# Patient Record
Sex: Female | Born: 1970 | Race: White | Hispanic: No | Smoking: Never smoker
Health system: Southern US, Community
[De-identification: ages and names within clinical notes are randomized; demographics above are authoritative.]

## PROBLEM LIST (undated history)

## (undated) DIAGNOSIS — I10 Essential (primary) hypertension: Secondary | ICD-10-CM

## (undated) DIAGNOSIS — E079 Disorder of thyroid, unspecified: Secondary | ICD-10-CM

## (undated) DIAGNOSIS — F419 Anxiety disorder, unspecified: Secondary | ICD-10-CM

## (undated) DIAGNOSIS — F319 Bipolar disorder, unspecified: Secondary | ICD-10-CM

## (undated) DIAGNOSIS — E785 Hyperlipidemia, unspecified: Secondary | ICD-10-CM

## (undated) DIAGNOSIS — K219 Gastro-esophageal reflux disease without esophagitis: Secondary | ICD-10-CM

## (undated) DIAGNOSIS — Z9889 Other specified postprocedural states: Secondary | ICD-10-CM

## (undated) HISTORY — DX: Hyperlipidemia, unspecified: E78.5

## (undated) HISTORY — DX: Bipolar disorder, unspecified: F31.9

## (undated) HISTORY — DX: Disorder of thyroid, unspecified: E07.9

## (undated) HISTORY — DX: Other specified postprocedural states: Z98.890

## (undated) HISTORY — DX: Essential (primary) hypertension: I10

## (undated) HISTORY — DX: Anxiety disorder, unspecified: F41.9

## (undated) HISTORY — DX: Gastro-esophageal reflux disease without esophagitis: K21.9

---

## 2013-03-12 ENCOUNTER — Other Ambulatory Visit: Payer: Self-pay | Admitting: *Deleted

## 2013-03-12 ENCOUNTER — Other Ambulatory Visit: Payer: Self-pay | Admitting: Nurse Practitioner

## 2013-03-12 MED ORDER — LORATADINE 10 MG PO TABS: ORAL_TABLET | ORAL | Status: DC

## 2013-03-12 MED ORDER — LEVOTHYROXINE SODIUM 100 MCG PO TABS: 100.0000 ug | ORAL_TABLET | Freq: Every day | ORAL | Status: DC

## 2013-03-12 MED ORDER — CLONAZEPAM 0.5 MG PO TABS: ORAL_TABLET | ORAL | Status: DC

## 2013-03-12 MED ORDER — LAMOTRIGINE 100 MG PO TABS: 100.0000 mg | ORAL_TABLET | Freq: Every day | ORAL | Status: DC

## 2013-03-12 NOTE — Telephone Encounter (Signed)
call clonazepam rx in 0 refills

## 2013-03-12 NOTE — Telephone Encounter (Signed)
PLEASE PHONE IN RX FOR CLONAZEPAM. ROUTE TO NURSE PLEASE. ALSO PATIENT NEEDS REFILL ON LAMOTRIGINE. [facility] 100MG  AND CHART SAYS 25MG . I WILL SEND BACK FOR REVIEW.

## 2013-03-13 NOTE — Telephone Encounter (Signed)
CALLED INTO PHARMACY

## 2013-03-20 ENCOUNTER — Ambulatory Visit (INDEPENDENT_AMBULATORY_CARE_PROVIDER_SITE_OTHER): Payer: Medicaid Other | Admitting: Nurse Practitioner

## 2013-03-20 ENCOUNTER — Encounter: Payer: Self-pay | Admitting: Nurse Practitioner

## 2013-03-20 VITALS — BP 122/77 | HR 116 | Temp 99.1°F | Ht 61.0 in | Wt 228.0 lb

## 2013-03-20 DIAGNOSIS — F4001 Agoraphobia with panic disorder: Secondary | ICD-10-CM

## 2013-03-20 DIAGNOSIS — E785 Hyperlipidemia, unspecified: Secondary | ICD-10-CM

## 2013-03-20 DIAGNOSIS — G43909 Migraine, unspecified, not intractable, without status migrainosus: Secondary | ICD-10-CM

## 2013-03-20 DIAGNOSIS — Z309 Encounter for contraceptive management, unspecified: Secondary | ICD-10-CM

## 2013-03-20 DIAGNOSIS — E039 Hypothyroidism, unspecified: Secondary | ICD-10-CM

## 2013-03-20 DIAGNOSIS — R32 Unspecified urinary incontinence: Secondary | ICD-10-CM

## 2013-03-20 DIAGNOSIS — I10 Essential (primary) hypertension: Secondary | ICD-10-CM

## 2013-03-20 DIAGNOSIS — K219 Gastro-esophageal reflux disease without esophagitis: Secondary | ICD-10-CM

## 2013-03-20 DIAGNOSIS — B009 Herpesviral infection, unspecified: Secondary | ICD-10-CM

## 2013-03-20 DIAGNOSIS — F3162 Bipolar disorder, current episode mixed, moderate: Secondary | ICD-10-CM

## 2013-03-20 DIAGNOSIS — IMO0001 Reserved for inherently not codable concepts without codable children: Secondary | ICD-10-CM

## 2013-03-20 MED ORDER — DARIFENACIN HYDROBROMIDE ER 15 MG PO TB24: 15.0000 mg | ORAL_TABLET | Freq: Every day | ORAL | Status: DC

## 2013-03-20 MED ORDER — VALACYCLOVIR HCL 500 MG PO TABS: 500.0000 mg | ORAL_TABLET | Freq: Two times a day (BID) | ORAL | Status: DC

## 2013-03-20 MED ORDER — MEDROXYPROGESTERONE ACETATE 150 MG/ML IM SUSP
150.0000 mg | Freq: Once | INTRAMUSCULAR | Status: AC
  Administered 2013-03-20: 150 mg via INTRAMUSCULAR

## 2013-03-20 MED ORDER — RANITIDINE HCL 150 MG PO TABS: 150.0000 mg | ORAL_TABLET | Freq: Two times a day (BID) | ORAL | Status: DC

## 2013-03-20 NOTE — Patient Instructions (Signed)
.   Contraception - medroxyPROGESTERone (DEPO-PROVERA) injection 150 mg; Inject 1 mL (150 mg total) into the muscle once.  2. Hyperlipidemia with target LDL less than 100 Low fat diet and exercise - NMR Lipoprofile with Lipids  3. Migraines Avoid caffeine KeEP HEADACHE DIARY - AMB referral to headache clinic  4. Unspecified hypothyroidism   5. GERD (gastroesophageal reflux disease) Limit spicy and fatty foods - ranitidine (ZANTAC) 150 MG tablet; Take 1 tablet (150 mg total) by mouth 2 (two) times daily.  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  Dispense: 30 tablet; Refill: 5  7. Bipolar 1 disorder, mixed, moderate Stress management  8. Agoraphobia with panic attacks Continue Klonopin as needed   9. Urinary incontinence in female Try to fully empty bladder when voiding - darifenacin (ENABLEX) 15 MG 24 hr tablet; Take 1 tablet (15 mg total) by mouth daily.  Dispense: 30 tablet; Refill: 5  10. Essential hypertension, benign Limit Na+ in diet - COMPLETE METABOLIC PANEL WITH GFR  Mary-Margaret Daphine Deutscher, FNP

## 2013-03-20 NOTE — Progress Notes (Signed)
Subjective:    Patient ID: Dawn Craig, female    DOB: 02-20-1971, 42 y.o.   MRN: [id number]  Hypertension This is a chronic problem. The current episode started more than 1 year ago. The problem is unchanged. The problem is controlled. Pertinent negatives include no blurred vision, chest pain, headaches, neck pain, palpitations, peripheral edema or shortness of breath. Agents associated with hypertension include thyroid hormones. Risk factors for coronary artery disease include dyslipidemia, obesity and sedentary lifestyle. Past treatments include ACE inhibitors. The current treatment provides significant improvement.  Hyperlipidemia This is a chronic problem. The current episode started more than 1 month ago. The problem is controlled. Recent lipid tests were reviewed and are high. There are no known factors aggravating her hyperlipidemia. Pertinent negatives include no chest pain, focal weakness, leg pain, myalgias or shortness of breath. Current antihyperlipidemic treatment includes statins, diet change and exercise. There are no compliance problems.  Risk factors for coronary artery disease include hypertension and obesity.  Migraines Chronic problem. Says she has some type of headache everyday. Her last migraine was 2 days ago imitrex not helping anymore. Patient has never been to a headache clinic. Hypothyridism Chronic problem. On levothyroxin daily. Doing okay No c/o fatigue. Bipolar Chronic problem. Patient is on Lamictal. Takes Klonopin when she goes out because she has panic attacks when she is around a lot of people. Bladder leakage  Started back in November. Happens daily. Has to wear panty liners daily. Patient says she doesn't feel it sometimes. Has trouble completely emptying bladder. Does C/O urgency Fever blisters Occur weekly lasting 6 days. Can have more than one at the same time. GERD  Patient use to be on zantac but hasn't had Rx in awhile. Needs a refill. Review of  Systems  HENT: Negative for neck pain.   Eyes: Negative for blurred vision.  Respiratory: Negative for shortness of breath.   Cardiovascular: Negative for chest pain and palpitations.  Genitourinary: Positive for urgency. Negative for dysuria, hematuria, flank pain and pelvic pain.  Musculoskeletal: Negative for myalgias.  Neurological: Negative for focal weakness and headaches.       Objective:   Physical Exam  Constitutional: She is oriented to person, place, and time. She appears well-developed and well-nourished.  Cardiovascular: Normal rate, normal heart sounds and intact distal pulses.   Pulmonary/Chest: Effort normal and breath sounds normal.  Abdominal: Soft. Bowel sounds are normal. She exhibits no distension. There is no tenderness.  Neurological: She is alert and oriented to person, place, and time.  Skin: Skin is warm.  Psychiatric: She has a normal mood and affect. Her behavior is normal. Judgment and thought content normal.  BP 122/77  Pulse 116  Temp(Src) 99.1 F (37.3 C) (Oral)  Ht 5\' 1"  (1.549 m)  Wt 228 lb (103.42 kg)  BMI 43.1 kg/m2         Assessment & Plan:  1. Contraception - medroxyPROGESTERone (DEPO-PROVERA) injection 150 mg; Inject 1 mL (150 mg total) into the muscle once.  2. Hyperlipidemia with target LDL less than 100 Low fat diet and exercise - NMR Lipoprofile with Lipids  3. Migraines Avoid caffeine KeEP HEADACHE DIARY - AMB referral to headache clinic  4. Unspecified hypothyroidism   5. GERD (gastroesophageal reflux disease) Limit spicy and fatty foods - ranitidine (ZANTAC) 150 MG tablet; Take 1 tablet (150 mg total) by mouth 2 (two) times daily.  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  Dispense: 30 tablet; Refill: 5  7. Bipolar 1 disorder, mixed, moderate Stress  management  8. Agoraphobia with panic attacks Continue Klonopin as needed   9. Urinary incontinence in female Try to fully empty bladder when voiding Referral to urology if no improvement in 2 months  - darifenacin (ENABLEX) 15 MG 24 hr tablet; Take 1 tablet (15 mg total) by mouth daily.  Dispense: 30 tablet; Refill: 5  10. Essential hypertension, benign Limit Na+ in diet - COMPLETE METABOLIC PANEL WITH GFR  Mary-Margaret Daphine Deutscher, FNP

## 2013-03-21 ENCOUNTER — Telehealth: Payer: Self-pay | Admitting: Nurse Practitioner

## 2013-03-21 LAB — NMR LIPOPROFILE WITH LIPIDS
Cholesterol, Total: 103 mg/dL (ref <200)
LDL (calc): 54 mg/dL (ref <100)
LDL Particle Number: 774 nmol/L (ref <1000)
LP-IR Score: 50 — ABNORMAL HIGH (ref <=45)
Triglycerides: 104 mg/dL (ref <150)
VLDL Size: 43.5 nm (ref <=46.6)

## 2013-03-21 LAB — COMPLETE METABOLIC PANEL WITH GFR
AST: 18 U/L (ref 0–37)
Albumin: 4.5 g/dL (ref 3.5–5.2)
Alkaline Phosphatase: 61 U/L (ref 39–117)
BUN: 6 mg/dL (ref 6–23)
Creat: 0.68 mg/dL (ref 0.50–1.10)
Potassium: 3.7 mEq/L (ref 3.5–5.3)

## 2013-03-22 ENCOUNTER — Telehealth: Payer: Self-pay | Admitting: *Deleted

## 2013-03-22 NOTE — Telephone Encounter (Signed)
PT AWARE OF LABS 

## 2013-03-23 NOTE — Telephone Encounter (Signed)
Prior auth started by Lupita Leash L. ,tried to reach pt. No answer

## 2013-03-29 ENCOUNTER — Telehealth: Payer: Self-pay | Admitting: Nurse Practitioner

## 2013-03-29 MED ORDER — SOLIFENACIN SUCCINATE 10 MG PO TABS: 10.0000 mg | ORAL_TABLET | Freq: Every day | ORAL | Status: DC

## 2013-03-29 NOTE — Telephone Encounter (Signed)
RX changed to vesicare 10 mg Qd- RX sent to pharmacy

## 2013-03-29 NOTE — Telephone Encounter (Signed)
Enabalex denied, gave to MMM to change med. They will notify pt. Of change

## 2013-04-10 ENCOUNTER — Other Ambulatory Visit: Payer: Self-pay | Admitting: Nurse Practitioner

## 2013-04-10 NOTE — Telephone Encounter (Signed)
Pt left  Detailed message that new rx was sent in for vesicare

## 2013-05-11 ENCOUNTER — Other Ambulatory Visit: Payer: Self-pay | Admitting: Nurse Practitioner

## 2013-05-14 NOTE — Telephone Encounter (Signed)
Please call in clonazepam rx with 2 refills

## 2013-05-14 NOTE — Telephone Encounter (Signed)
Rx called in 

## 2013-05-14 NOTE — Telephone Encounter (Signed)
Last seen 03/20/13, last filled 03/12/13. If approved [facility]

## 2013-06-04 ENCOUNTER — Other Ambulatory Visit: Payer: Self-pay | Admitting: Nurse Practitioner

## 2013-06-07 ENCOUNTER — Other Ambulatory Visit: Payer: Self-pay | Admitting: Nurse Practitioner

## 2013-06-13 ENCOUNTER — Ambulatory Visit (INDEPENDENT_AMBULATORY_CARE_PROVIDER_SITE_OTHER): Payer: Medicaid Other | Admitting: Nurse Practitioner

## 2013-06-13 ENCOUNTER — Encounter: Payer: Self-pay | Admitting: Nurse Practitioner

## 2013-06-13 VITALS — BP 129/76 | HR 95 | Temp 98.5°F | Ht 61.0 in | Wt 208.0 lb

## 2013-06-13 DIAGNOSIS — K219 Gastro-esophageal reflux disease without esophagitis: Secondary | ICD-10-CM

## 2013-06-13 DIAGNOSIS — F3162 Bipolar disorder, current episode mixed, moderate: Secondary | ICD-10-CM

## 2013-06-13 DIAGNOSIS — G43909 Migraine, unspecified, not intractable, without status migrainosus: Secondary | ICD-10-CM

## 2013-06-13 DIAGNOSIS — R5383 Other fatigue: Secondary | ICD-10-CM

## 2013-06-13 DIAGNOSIS — I10 Essential (primary) hypertension: Secondary | ICD-10-CM

## 2013-06-13 DIAGNOSIS — E039 Hypothyroidism, unspecified: Secondary | ICD-10-CM

## 2013-06-13 DIAGNOSIS — Z309 Encounter for contraceptive management, unspecified: Secondary | ICD-10-CM

## 2013-06-13 DIAGNOSIS — R5381 Other malaise: Secondary | ICD-10-CM

## 2013-06-13 DIAGNOSIS — IMO0001 Reserved for inherently not codable concepts without codable children: Secondary | ICD-10-CM

## 2013-06-13 DIAGNOSIS — E785 Hyperlipidemia, unspecified: Secondary | ICD-10-CM

## 2013-06-13 DIAGNOSIS — E559 Vitamin D deficiency, unspecified: Secondary | ICD-10-CM

## 2013-06-13 DIAGNOSIS — R32 Unspecified urinary incontinence: Secondary | ICD-10-CM

## 2013-06-13 LAB — THYROID PANEL WITH TSH
Free Thyroxine Index: 4.7 — ABNORMAL HIGH (ref 1.0–3.9)
TSH: 0.206 u[IU]/mL — ABNORMAL LOW (ref 0.350–4.500)

## 2013-06-13 LAB — ANEMIA PANEL 7
ABS Retic: 57.6 10*3/uL (ref 19.0–186.0)
Folate: 14.2 ng/mL
Hemoglobin: 14.1 g/dL (ref 12.0–15.0)
MCHC: 34.8 g/dL (ref 30.0–36.0)
RBC.: 4.43 MIL/uL (ref 3.87–5.11)
RBC: 4.43 MIL/uL (ref 3.87–5.11)
Retic Ct Pct: 1.3 % (ref 0.4–2.3)
UIBC: 202 ug/dL (ref 125–400)
WBC: 6.2 10*3/uL (ref 4.0–10.5)

## 2013-06-13 LAB — COMPLETE METABOLIC PANEL WITH GFR
ALT: 13 U/L (ref 0–35)
AST: 16 U/L (ref 0–37)
Albumin: 4.4 g/dL (ref 3.5–5.2)
CO2: 25 mEq/L (ref 19–32)
Calcium: 9.6 mg/dL (ref 8.4–10.5)
Chloride: 104 mEq/L (ref 96–112)
GFR, Est African American: >89 mL/min
Potassium: 4 mEq/L (ref 3.5–5.3)

## 2013-06-13 MED ORDER — PRAVASTATIN SODIUM 40 MG PO TABS: 40.0000 mg | ORAL_TABLET | Freq: Every day | ORAL | Status: DC

## 2013-06-13 MED ORDER — MEDROXYPROGESTERONE ACETATE 150 MG/ML IM SUSP
150.0000 mg | Freq: Once | INTRAMUSCULAR | Status: AC
  Administered 2013-06-13: 150 mg via INTRAMUSCULAR

## 2013-06-13 MED ORDER — SOLIFENACIN SUCCINATE 10 MG PO TABS: 10.0000 mg | ORAL_TABLET | Freq: Every day | ORAL | Status: DC

## 2013-06-13 MED ORDER — LOSARTAN POTASSIUM-HCTZ 100-12.5 MG PO TABS: 1.0000 | ORAL_TABLET | Freq: Every day | ORAL | Status: DC

## 2013-06-13 MED ORDER — LAMOTRIGINE 100 MG PO TABS: 100.0000 mg | ORAL_TABLET | Freq: Every day | ORAL | Status: DC

## 2013-06-13 MED ORDER — RANITIDINE HCL 150 MG PO TABS: 150.0000 mg | ORAL_TABLET | Freq: Two times a day (BID) | ORAL | Status: DC

## 2013-06-13 MED ORDER — LEVOTHYROXINE SODIUM 100 MCG PO TABS: 100.0000 ug | ORAL_TABLET | Freq: Every day | ORAL | Status: DC

## 2013-06-13 MED ORDER — SUMATRIPTAN SUCCINATE 100 MG PO TABS: 100.0000 mg | ORAL_TABLET | ORAL | Status: DC | PRN

## 2013-06-13 NOTE — Progress Notes (Signed)
Subjective:    Patient ID: Dawn Craig, female    DOB: December 01, 1970, 42 y.o.   MRN: [id number]  Hypertension This is a chronic problem. The current episode started more than 1 year ago. The problem is unchanged. The problem is controlled. Pertinent negatives include no blurred vision, chest pain, headaches, neck pain, orthopnea, palpitations, peripheral edema, PND or shortness of breath. There are no associated agents to hypertension. Risk factors for coronary artery disease include dyslipidemia and obesity. Past treatments include angiotensin blockers and diuretics. The current treatment provides moderate improvement. There are no compliance problems.  Hypertensive end-organ damage includes a thyroid problem.  Hyperlipidemia This is a chronic problem. The current episode started more than 1 year ago. The problem is uncontrolled. Exacerbating diseases include obesity. She has no history of diabetes or hypothyroidism. There are no known factors aggravating her hyperlipidemia. Pertinent negatives include no chest pain, myalgias or shortness of breath. Current antihyperlipidemic treatment includes statins. The current treatment provides no improvement of lipids. There are no compliance problems.  Risk factors for coronary artery disease include stress.  Thyroid Problem Presents for follow-up (hypothyroidism) visit. Patient reports no constipation, depressed mood, diaphoresis, diarrhea, dry skin, heat intolerance, leg swelling, palpitations, visual change, weight gain or weight loss. The symptoms have been stable. Her past medical history is significant for hyperlipidemia. There is no history of diabetes.  GAD Klonopin working well no side effects bipolar Lamictal keeps her calm Migraines Imitrex helps and she takes 2-3 X per month Vitamin D deficiency Vitamin D OTC  * Light period for 1 month- On deposhot- Bleeding is only when she wipes- Has been on depo for about 8 years. * Rash in abdominal fold-  says she sweats a lot- uses antifungal cream which helps- but keeps coming back. * Bilateral ear pan - intermittent- has vertigo 1-2 X per week lasting about 30 minutes with some nauses * Fatigue-= she walks 5 miles daily but says that when she sits down she falls asleep- Hair loss she has noticed. Review of Systems  Constitutional: Negative for weight loss, weight gain and diaphoresis.  HENT: Negative for neck pain.   Eyes: Negative for blurred vision.  Respiratory: Negative for shortness of breath.   Cardiovascular: Negative for chest pain, palpitations, orthopnea and PND.  Gastrointestinal: Negative for diarrhea and constipation.  Endocrine: Negative for heat intolerance.  Musculoskeletal: Negative for myalgias.  Neurological: Negative for headaches.  All other systems reviewed and are negative.       Objective:   Physical Exam  Constitutional: She is oriented to person, place, and time. She appears well-developed and well-nourished.  HENT:  Nose: Nose normal.  Mouth/Throat: Oropharynx is clear and moist.  Eyes: EOM are normal.  Neck: Trachea normal, normal range of motion and full passive range of motion without pain. Neck supple. No JVD present. Carotid bruit is not present. No thyromegaly present.  Cardiovascular: Normal rate, regular rhythm, normal heart sounds and intact distal pulses.  Exam reveals no gallop and no friction rub.   No murmur heard. Pulmonary/Chest: Effort normal and breath sounds normal.  Abdominal: Soft. Bowel sounds are normal. She exhibits no distension and no mass. There is no tenderness.  Musculoskeletal: Normal range of motion.  Lymphadenopathy:    She has no cervical adenopathy.  Neurological: She is alert and oriented to person, place, and time. She has normal reflexes.  Skin: Skin is warm and dry.  Psychiatric: She has a normal mood and affect. Her behavior is normal. Judgment  and thought content normal.    BP 129/76  Pulse 95  Temp(Src) 98.5  F (36.9 C) (Oral)  Ht 5\' 1"  (1.549 m)  Wt 208 lb (94.348 kg)  BMI 39.32 kg/m2       Assessment & Plan:   1. Contraception   2. Hyperlipidemia with target LDL less than 100   3. Migraines   4. Unspecified hypothyroidism   5. GERD (gastroesophageal reflux disease)   6. Bipolar 1 disorder, mixed, moderate   7. Essential hypertension, benign   8. Urinary incontinence   9. Other malaise and fatigue   10. Unspecified vitamin D deficiency    Orders Placed This Encounter  Procedures  . COMPLETE METABOLIC PANEL WITH GFR  . NMR Lipoprofile with Lipids  . Thyroid Panel With TSH  . Anemia panel 7  . Vitamin D 25 hydroxy   Meds ordered this encounter  Medications  . medroxyPROGESTERone (DEPO-PROVERA) injection 150 mg    Sig:   . lamoTRIgine (LAMICTAL) 100 MG tablet    Sig: Take 1 tablet (100 mg total) by mouth daily.    Dispense:  30 tablet    Refill:  3    Order Specific Question:  Supervising Provider    Answer:  Ernestina Penna [[id number]]  . levothyroxine (SYNTHROID, LEVOTHROID) 100 MCG tablet    Sig: Take 1 tablet (100 mcg total) by mouth daily.    Dispense:  30 tablet    Refill:  5    Order Specific Question:  Supervising Provider    Answer:  Ernestina Penna [[id number]]  . losartan-hydrochlorothiazide (HYZAAR) 100-12.5 MG per tablet    Sig: Take 1 tablet by mouth daily.    Dispense:  30 tablet    Refill:  2    Order Specific Question:  Supervising Provider    Answer:  Ernestina Penna [[id number]]  . pravastatin (PRAVACHOL) 40 MG tablet    Sig: Take 1 tablet (40 mg total) by mouth daily.    Dispense:  30 tablet    Refill:  3    Order Specific Question:  Supervising Provider    Answer:  Ernestina Penna [[id number]]  . ranitidine (ZANTAC) 150 MG tablet    Sig: Take 1 tablet (150 mg total) by mouth 2 (two) times daily.    Dispense:  30 tablet    Refill:  5    Order Specific Question:  Supervising Provider    Answer:  Ernestina Penna [[id number]]  . solifenacin (VESICARE) 10 MG tablet     Sig: Take 1 tablet (10 mg total) by mouth daily.    Dispense:  30 tablet    Refill:  3    Order Specific Question:  Supervising Provider    Answer:  Ernestina Penna [[id number]]  . SUMAtriptan (IMITREX) 100 MG tablet    Sig: Take 1 tablet (100 mg total) by mouth every 2 (two) hours as needed for migraine.    Dispense:  10 tablet    Refill:  2    Order Specific Question:  Supervising Provider    Answer:  Deborra Medina   Continue all meds Labs pending Diet and exercise encouraged Force fluids prior to exercise Follow up in 3 months  Mary-Margaret Daphine Deutscher, FNP

## 2013-06-13 NOTE — Patient Instructions (Signed)
Vertigo Vertigo means you feel like you or your surroundings are moving when they are not. Vertigo can be dangerous if it occurs when you are at work, driving, or performing difficult activities.  CAUSES  Vertigo occurs when there is a conflict of signals sent to your brain from the visual and sensory systems in your body. There are many different causes of vertigo, including:  Infections, especially in the inner ear.  A bad reaction to a drug or misuse of alcohol and medicines.  Withdrawal from drugs or alcohol.  Rapidly changing positions, such as lying down or rolling over in bed.  A migraine headache.  Decreased blood flow to the brain.  Increased pressure in the brain from a head injury, infection, tumor, or bleeding. SYMPTOMS  You may feel as though the world is spinning around or you are falling to the ground. Because your balance is upset, vertigo can cause nausea and vomiting. You may have involuntary eye movements (nystagmus). DIAGNOSIS  Vertigo is usually diagnosed by physical exam. If the cause of your vertigo is unknown, your caregiver may perform imaging tests, such as an MRI scan (magnetic resonance imaging). TREATMENT  Most cases of vertigo resolve on their own, without treatment. Depending on the cause, your caregiver may prescribe certain medicines. If your vertigo is related to body position issues, your caregiver may recommend movements or procedures to correct the problem. In rare cases, if your vertigo is caused by certain inner ear problems, you may need surgery. HOME CARE INSTRUCTIONS   Follow your caregiver's instructions.  Avoid driving.  Avoid operating heavy machinery.  Avoid performing any tasks that would be dangerous to you or others during a vertigo episode.  Tell your caregiver if you notice that certain medicines seem to be causing your vertigo. Some of the medicines used to treat vertigo episodes can actually make them worse in some people. SEEK  IMMEDIATE MEDICAL CARE IF:   Your medicines do not relieve your vertigo or are making it worse.  You develop problems with talking, walking, weakness, or using your arms, hands, or legs.  You develop severe headaches.  Your nausea or vomiting continues or gets worse.  You develop visual changes.  A family member notices behavioral changes.  Your condition gets worse. MAKE SURE YOU:  Understand these instructions.  Will watch your condition.  Will get help right away if you are not doing well or get worse. Document Released: 08/25/2005 Document Revised: 02/07/2012 Document Reviewed: 06/03/2011 [employer] Patient Information 2014 [employer].  

## 2013-06-14 ENCOUNTER — Other Ambulatory Visit: Payer: Self-pay | Admitting: Nurse Practitioner

## 2013-06-14 LAB — NMR LIPOPROFILE WITH LIPIDS
Cholesterol, Total: 104 mg/dL (ref <200)
HDL Particle Number: 21.6 umol/L — ABNORMAL LOW (ref >=30.5)
LP-IR Score: 60 — ABNORMAL HIGH (ref <=45)
Large HDL-P: 2.2 umol/L — ABNORMAL LOW (ref >=4.8)
Large VLDL-P: 2.5 nmol/L (ref <=2.7)
Small LDL Particle Number: 658 nmol/L — ABNORMAL HIGH (ref <=527)

## 2013-06-14 MED ORDER — LEVOTHYROXINE SODIUM 88 MCG PO TABS: 88.0000 ug | ORAL_TABLET | Freq: Every day | ORAL | Status: DC

## 2013-08-22 ENCOUNTER — Other Ambulatory Visit: Payer: Self-pay | Admitting: Nurse Practitioner

## 2013-09-05 ENCOUNTER — Other Ambulatory Visit: Payer: Self-pay | Admitting: Nurse Practitioner

## 2013-09-13 ENCOUNTER — Ambulatory Visit (INDEPENDENT_AMBULATORY_CARE_PROVIDER_SITE_OTHER): Payer: Medicaid Other | Admitting: *Deleted

## 2013-09-13 DIAGNOSIS — Z3049 Encounter for surveillance of other contraceptives: Secondary | ICD-10-CM

## 2013-09-13 DIAGNOSIS — Z23 Encounter for immunization: Secondary | ICD-10-CM

## 2013-09-13 DIAGNOSIS — E039 Hypothyroidism, unspecified: Secondary | ICD-10-CM

## 2013-09-13 DIAGNOSIS — Z3009 Encounter for other general counseling and advice on contraception: Secondary | ICD-10-CM

## 2013-09-13 MED ORDER — MEDROXYPROGESTERONE ACETATE 150 MG/ML IM SUSP
150.0000 mg | Freq: Once | INTRAMUSCULAR | Status: AC
  Administered 2013-09-13: 150 mg via INTRAMUSCULAR

## 2013-09-14 LAB — THYROID PANEL WITH TSH
Free Thyroxine Index: 2.3 (ref 1.2–4.9)
TSH: 2.41 u[IU]/mL (ref 0.450–4.500)

## 2013-09-27 ENCOUNTER — Telehealth: Payer: Self-pay | Admitting: Nurse Practitioner

## 2013-09-27 ENCOUNTER — Ambulatory Visit (INDEPENDENT_AMBULATORY_CARE_PROVIDER_SITE_OTHER): Payer: Medicaid Other | Admitting: Nurse Practitioner

## 2013-09-27 ENCOUNTER — Encounter: Payer: Self-pay | Admitting: Nurse Practitioner

## 2013-09-27 VITALS — BP 134/85 | HR 88 | Temp 99.8°F | Ht 61.0 in | Wt 210.0 lb

## 2013-09-27 DIAGNOSIS — H109 Unspecified conjunctivitis: Secondary | ICD-10-CM

## 2013-09-27 DIAGNOSIS — R197 Diarrhea, unspecified: Secondary | ICD-10-CM

## 2013-09-27 MED ORDER — BETAMETHASONE DIPROPIONATE 0.05 % EX CREA: TOPICAL_CREAM | Freq: Two times a day (BID) | CUTANEOUS | Status: AC

## 2013-09-27 MED ORDER — TOBRAMYCIN-DEXAMETHASONE 0.3-0.1 % OP OINT: TOPICAL_OINTMENT | Freq: Three times a day (TID) | OPHTHALMIC | Status: DC

## 2013-09-27 NOTE — Progress Notes (Signed)
  Subjective:    Patient ID: Dawn Craig, female    DOB: [birth year], 41 y.o.   MRN: [id number]  Diarrhea  This is a new problem. The current episode started today. The problem occurs 5 to 10 times per day. The problem has been gradually worsening. The patient states that diarrhea awakens her from sleep. Associated symptoms include abdominal pain, bloating and a URI. Nothing aggravates the symptoms. She has tried nothing for the symptoms.  Conjunctivitis  The current episode started more than 2 weeks ago. The onset was gradual. The problem occurs continuously. The problem has been gradually worsening. The problem is mild. Associated symptoms include double vision, eye itching, abdominal pain, diarrhea, ear pain, URI, eye discharge, eye pain and eye redness. The eye pain is mild. The right eye is affected.The eye pain is associated with movement. The eyelid exhibits swelling and redness. There is nasal congestion. The ear pain is mild. There is pain in the left ear. There were no sick contacts.      Review of Systems  HENT: Positive for ear pain.   Eyes: Positive for double vision, pain, discharge, redness and itching.  Gastrointestinal: Positive for abdominal pain, diarrhea and bloating.  All other systems reviewed and are negative.       Objective:   Physical Exam  Vitals reviewed. Constitutional: She appears well-developed and well-nourished.  Eyes: Right eye exhibits discharge (erythemous and swelling).  Right eye has mild conjunctivitis, with eyelid swelling    Cardiovascular: Normal rate, regular rhythm, normal heart sounds and intact distal pulses.   Pulmonary/Chest: Effort normal and breath sounds normal.  Abdominal: Soft. Bowel sounds are normal. There is tenderness (mild).  Skin: Skin is warm and dry.  Psychiatric: She has a normal mood and affect. Her behavior is normal. Judgment and thought content normal.    BP 134/85  Pulse 88  Temp(Src) 99.8 F (37.7 C) (Oral)  Ht  5\' 1"  (1.549 m)  Wt 210 lb (95.255 kg)  BMI 39.7 kg/m2       Assessment & Plan:   1. Conjunctivitis of right eye   2. Diarrhea    Meds ordered this encounter  Medications  . tobramycin-dexamethasone (TOBRADEX) ophthalmic ointment    Sig: Place into the right eye 3 (three) times daily.    Dispense:  3.5 g    Refill:  0    Order Specific Question:  Supervising Provider    Answer:  Ernestina Penna [[id number]]  . betamethasone dipropionate (DIPROLENE) 0.05 % cream    Sig: Apply topically 2 (two) times daily.    Dispense:  30 g    Refill:  3    Order Specific Question:  Supervising Provider    Answer:  Ernestina Penna [[id number]]    Warm compression on eye Do not rub eye  Good hand hygiene Imodium OTC for diarrhea   Mary-Margaret Daphine Deutscher, FNP

## 2013-09-27 NOTE — Telephone Encounter (Signed)
Made appt w/ mmm 09/27/13.  rs

## 2013-09-27 NOTE — Patient Instructions (Signed)

## 2013-10-03 ENCOUNTER — Telehealth: Payer: Self-pay | Admitting: Nurse Practitioner

## 2013-10-04 ENCOUNTER — Ambulatory Visit (INDEPENDENT_AMBULATORY_CARE_PROVIDER_SITE_OTHER): Payer: Medicaid Other | Admitting: Nurse Practitioner

## 2013-10-04 ENCOUNTER — Encounter: Payer: Self-pay | Admitting: Nurse Practitioner

## 2013-10-04 VITALS — BP 148/98 | HR 86 | Temp 99.5°F | Ht 61.0 in | Wt 208.0 lb

## 2013-10-04 DIAGNOSIS — H00019 Hordeolum externum unspecified eye, unspecified eyelid: Secondary | ICD-10-CM

## 2013-10-04 DIAGNOSIS — H109 Unspecified conjunctivitis: Secondary | ICD-10-CM

## 2013-10-04 DIAGNOSIS — I10 Essential (primary) hypertension: Secondary | ICD-10-CM

## 2013-10-04 DIAGNOSIS — H00013 Hordeolum externum right eye, unspecified eyelid: Secondary | ICD-10-CM

## 2013-10-04 MED ORDER — ERYTHROMYCIN 5 MG/GM OP OINT: TOPICAL_OINTMENT | OPHTHALMIC | Status: DC

## 2013-10-04 NOTE — Telephone Encounter (Signed)
appt made for today to rck eye

## 2013-10-04 NOTE — Progress Notes (Signed)
  Subjective:    Patient ID: Dawn Craig, female    DOB: 12/28/1970, 42 y.o.   MRN: [id number]  HPI  Patient was seen last week c/o red and irritated eyes with discharge- was given tobradex drops- patient in today saying that right eye is no better- looked like it was getting better at first but now is charge is back again and it is a greenish color. Eye lashes matted together in AM.    Review of Systems  Constitutional: Negative.   HENT: Negative.   Eyes: Positive for pain, discharge and redness. Negative for photophobia, itching and visual disturbance.  Respiratory: Negative.   Cardiovascular: Negative.   Gastrointestinal: Negative.   Endocrine: Negative.   Genitourinary: Negative.   Musculoskeletal: Negative.   Neurological: Negative.   Hematological: Negative.   Psychiatric/Behavioral: Negative.        Objective:   Physical Exam  Constitutional: She appears well-developed and well-nourished.  HENT:  Erythematous small tender nodule upper lid border. Mild erythemtous conjunctivia- no drainage noted  Cardiovascular: Normal rate, regular rhythm, normal heart sounds and intact distal pulses.   Pulmonary/Chest: Effort normal and breath sounds normal.   BP 148/98  Pulse 86  Temp(Src) 99.5 F (37.5 C) (Oral)  Ht 5\' 1"  (1.549 m)  Wt 208 lb (94.348 kg)  BMI 39.32 kg/m2        Assessment & Plan:   1. Conjunctivitis   2. Stye, right   3. Essential hypertension, benign    Good hand washing Warm compresses Follow up prn  Mary-Margaret Daphine Deutscher, FNP

## 2013-10-04 NOTE — Patient Instructions (Signed)

## 2013-11-01 ENCOUNTER — Other Ambulatory Visit: Payer: Self-pay | Admitting: Nurse Practitioner

## 2013-11-28 ENCOUNTER — Other Ambulatory Visit: Payer: Self-pay | Admitting: Nurse Practitioner

## 2013-12-03 ENCOUNTER — Ambulatory Visit (INDEPENDENT_AMBULATORY_CARE_PROVIDER_SITE_OTHER): Payer: Medicaid Other | Admitting: *Deleted

## 2013-12-03 DIAGNOSIS — IMO0001 Reserved for inherently not codable concepts without codable children: Secondary | ICD-10-CM

## 2013-12-03 DIAGNOSIS — Z309 Encounter for contraceptive management, unspecified: Secondary | ICD-10-CM

## 2013-12-03 MED ORDER — MEDROXYPROGESTERONE ACETATE 150 MG/ML IM SUSP
150.0000 mg | Freq: Once | INTRAMUSCULAR | Status: AC
  Administered 2013-12-03: 150 mg via INTRAMUSCULAR

## 2013-12-03 NOTE — Patient Instructions (Signed)

## 2013-12-03 NOTE — Progress Notes (Signed)
Depo provera given and tolerated well. 

## 2013-12-14 ENCOUNTER — Ambulatory Visit (INDEPENDENT_AMBULATORY_CARE_PROVIDER_SITE_OTHER): Payer: Medicaid Other | Admitting: Nurse Practitioner

## 2013-12-14 ENCOUNTER — Other Ambulatory Visit: Payer: Self-pay | Admitting: Nurse Practitioner

## 2013-12-14 ENCOUNTER — Encounter: Payer: Self-pay | Admitting: Nurse Practitioner

## 2013-12-14 VITALS — BP 135/73 | HR 97 | Temp 98.0°F | Ht 61.0 in | Wt 218.0 lb

## 2013-12-14 DIAGNOSIS — E039 Hypothyroidism, unspecified: Secondary | ICD-10-CM

## 2013-12-14 DIAGNOSIS — F4001 Agoraphobia with panic disorder: Secondary | ICD-10-CM

## 2013-12-14 DIAGNOSIS — F3162 Bipolar disorder, current episode mixed, moderate: Secondary | ICD-10-CM

## 2013-12-14 DIAGNOSIS — E785 Hyperlipidemia, unspecified: Secondary | ICD-10-CM

## 2013-12-14 DIAGNOSIS — K219 Gastro-esophageal reflux disease without esophagitis: Secondary | ICD-10-CM

## 2013-12-14 DIAGNOSIS — J329 Chronic sinusitis, unspecified: Secondary | ICD-10-CM

## 2013-12-14 DIAGNOSIS — I10 Essential (primary) hypertension: Secondary | ICD-10-CM

## 2013-12-14 DIAGNOSIS — G43909 Migraine, unspecified, not intractable, without status migrainosus: Secondary | ICD-10-CM

## 2013-12-14 MED ORDER — AZITHROMYCIN 250 MG PO TABS: ORAL_TABLET | ORAL | Status: DC

## 2013-12-14 NOTE — Progress Notes (Signed)
Subjective:    Patient ID: Dawn Craig, female    DOB: 21-Jul-1971, 43 y.o.   MRN: [id number]  Hypertension This is a chronic problem. The current episode started more than 1 year ago. The problem is unchanged. The problem is controlled. Pertinent negatives include no blurred vision, chest pain, palpitations or peripheral edema. Agents associated with hypertension include thyroid hormones. Risk factors for coronary artery disease include dyslipidemia, obesity and sedentary lifestyle. Past treatments include ACE inhibitors. The current treatment provides significant improvement. Hypertensive end-organ damage includes a thyroid problem.  Hyperlipidemia This is a chronic problem. The current episode started more than 1 month ago. The problem is controlled. Recent lipid tests were reviewed and are high. There are no known factors aggravating her hyperlipidemia. Pertinent negatives include no chest pain, focal weakness, leg pain or myalgias. Current antihyperlipidemic treatment includes statins, diet change and exercise. There are no compliance problems.  Risk factors for coronary artery disease include hypertension and obesity.  Thyroid Problem Presents for follow-up (hypothyroidism) visit. Patient reports no constipation, depressed mood, diaphoresis, diarrhea, dry skin, heat intolerance, menstrual problem, palpitations, visual change, weight gain or weight loss. The symptoms have been stable. Her past medical history is significant for hyperlipidemia.  Migraines Chronic problem. Says she has some type of headache everyday. Her last migraine was 2 days ago imitrex not helping anymore. Patient has never been to a headache clinic. Bipolar Chronic problem. Patient is on Lamictal. Takes Klonopin when she goes out because she has panic attacks when she is around a lot of people. Bladder leakage  Started back in November. Happens daily. Has to wear panty liners daily. Patient says she doesn't feel it  sometimes. Has trouble completely emptying bladder. Does C/O urgency Fever blisters Occur weekly lasting 6 days. Can have more than one at the same time. GERD  Patient use to be on zantac but hasn't had Rx in awhile. Needs a refill. * c/o sinus pressure since October- no better- not really taking anything for it- denies fever and cough.- does have increasing headache- not really the same as her migraines. Review of Systems  Constitutional: Negative for weight loss, weight gain and diaphoresis.  Eyes: Negative for blurred vision.  Cardiovascular: Negative for chest pain and palpitations.  Gastrointestinal: Negative for diarrhea and constipation.  Endocrine: Negative for heat intolerance.  Genitourinary: Positive for urgency. Negative for dysuria, hematuria, flank pain, menstrual problem and pelvic pain.  Musculoskeletal: Negative for myalgias.  Neurological: Negative for focal weakness.       Objective:   Physical Exam  Constitutional: She is oriented to person, place, and time. She appears well-developed and well-nourished.  HENT:  Right Ear: Hearing, tympanic membrane, external ear and ear canal normal.  Left Ear: Hearing, tympanic membrane, external ear and ear canal normal.  Nose: Mucosal edema and rhinorrhea present. Right sinus exhibits maxillary sinus tenderness. Right sinus exhibits no frontal sinus tenderness. Left sinus exhibits maxillary sinus tenderness. Left sinus exhibits no frontal sinus tenderness.  Mouth/Throat: Uvula is midline, oropharynx is clear and moist and mucous membranes are normal.  Eyes: EOM are normal. Pupils are equal, round, and reactive to light.  Neck: Normal range of motion. Neck supple.  Cardiovascular: Normal rate, normal heart sounds and intact distal pulses.   Pulmonary/Chest: Effort normal and breath sounds normal.  Abdominal: Soft. Bowel sounds are normal. She exhibits no distension. There is no tenderness.  Lymphadenopathy:    She has no  cervical adenopathy.  Neurological: She is alert and oriented  to person, place, and time.  Skin: Skin is warm.  Psychiatric: She has a normal mood and affect. Her behavior is normal. Judgment and thought content normal.  BP 135/73  Pulse 97  Temp(Src) 98 F (36.7 C) (Oral)  Ht '5\' 1"'  (1.549 m)  Wt 218 lb (98.884 kg)  BMI 41.21 kg/m2         Assessment & Plan:   1. Unspecified hypothyroidism   2. Migraines   3. Hyperlipidemia LDL goal < 100   4. GERD (gastroesophageal reflux disease)   5. Essential hypertension, benign   6. Bipolar 1 disorder, mixed, moderate   7. Agoraphobia with panic attacks   8. Sinusitis, chronic    Orders Placed This Encounter  Procedures  . CMP14+EGFR  . NMR, lipoprofile  . Thyroid Panel With TSH    Meds ordered this encounter  Medications  . azithromycin (ZITHROMAX Z-PAK) 250 MG tablet    Sig: As directed    Dispense:  6 each    Refill:  0    Order Specific Question:  Supervising Provider    Answer:  [facility] [place] maintenance reviewed Diet and exercise encouraged Continue all meds 1. Take meds as prescribed 2. Use a cool mist humidifier especially during the winter months and when heat has been humid. 3. Use saline nose sprays frequently 4. Saline irrigations of the nose can be very helpful if done frequently.  * 4X daily for 1 week*  * Use of a nettie pot can be helpful with this. Follow directions with this* 5. Drink plenty of fluids 6. Keep thermostat turn down low 7.For any cough or congestion  Use plain Mucinex- regular strength or max strength is fine   * Children- consult with Pharmacist for dosing 8. For fever or aces or pains- take tylenol or ibuprofen appropriate for age and weight.  * for fevers greater than 101 orally you may alternate ibuprofen and tylenol every  3 hours.    Follow up  In 3 months   [place], FNP

## 2013-12-14 NOTE — Patient Instructions (Signed)

## 2013-12-15 LAB — CMP14+EGFR
ALT: 17 IU/L (ref 0–32)
AST: 17 IU/L (ref 0–40)
Albumin/Globulin Ratio: 2.1 (ref 1.1–2.5)
Albumin: 4.6 g/dL (ref 3.5–5.5)
Alkaline Phosphatase: 59 IU/L (ref 39–117)
BUN/Creatinine Ratio: 17 (ref 9–23)
BUN: 12 mg/dL (ref 6–24)
CALCIUM: 9.5 mg/dL (ref 8.7–10.2)
CO2: 22 mmol/L (ref 18–29)
Chloride: 101 mmol/L (ref 97–108)
Creatinine, Ser: 0.71 mg/dL (ref 0.57–1.00)
GFR calc Af Amer: 121 mL/min/{1.73_m2} (ref >59)
GFR calc non Af Amer: 105 mL/min/{1.73_m2} (ref >59)
Globulin, Total: 2.2 g/dL (ref 1.5–4.5)
Glucose: 104 mg/dL — ABNORMAL HIGH (ref 65–99)
POTASSIUM: 4.3 mmol/L (ref 3.5–5.2)
SODIUM: 140 mmol/L (ref 134–144)
Total Bilirubin: 0.9 mg/dL (ref 0.0–1.2)
Total Protein: 6.8 g/dL (ref 6.0–8.5)

## 2013-12-15 LAB — THYROID PANEL WITH TSH
Free Thyroxine Index: 2.9 (ref 1.2–4.9)
T3 UPTAKE RATIO: 30 % (ref 24–39)
T4, Total: 9.8 ug/dL (ref 4.5–12.0)
TSH: 2.3 u[IU]/mL (ref 0.450–4.500)

## 2013-12-15 LAB — NMR, LIPOPROFILE

## 2013-12-17 ENCOUNTER — Telehealth: Payer: Self-pay | Admitting: Family Medicine

## 2013-12-17 NOTE — Telephone Encounter (Signed)
Message copied by Azalee CourseFULP, ASHLEY on Mon Dec 17, 2013 11:16 AM ------      Message from: Bennie PieriniMARTIN, MARY-MARGARET      Created: Mon Dec 17, 2013  7:14 AM       All labs look good- recheck in 3 months- continue current meds ------

## 2014-01-03 ENCOUNTER — Other Ambulatory Visit: Payer: Self-pay | Admitting: Nurse Practitioner

## 2014-01-24 LAB — NMR, LIPOPROFILE
Cholesterol: 135 mg/dL (ref <200)
HDL CHOLESTEROL BY NMR: 42 mg/dL (ref >=40)
HDL Particle Number: 29.7 umol/L — ABNORMAL LOW (ref >=30.5)
LDL Particle Number: 1220 nmol/L — ABNORMAL HIGH (ref <1000)
LDL Size: 20.6 nm (ref >20.5)
LDLC SERPL CALC-MCNC: 79 mg/dL (ref <100)
LP-IR Score: 55 — ABNORMAL HIGH (ref <=45)
Small LDL Particle Number: 634 nmol/L — ABNORMAL HIGH (ref <=527)
Triglycerides by NMR: 72 mg/dL (ref <150)

## 2014-02-15 ENCOUNTER — Telehealth: Payer: Self-pay | Admitting: *Deleted

## 2014-02-15 NOTE — Telephone Encounter (Signed)
Not on med list- when was last pap

## 2014-02-15 NOTE — Telephone Encounter (Signed)
Pharmacy requesting refill on depo shot but do not see on current med list. Please advise

## 2014-02-18 ENCOUNTER — Ambulatory Visit: Payer: Medicaid Other

## 2014-02-18 ENCOUNTER — Other Ambulatory Visit: Payer: Self-pay | Admitting: Nurse Practitioner

## 2014-02-18 ENCOUNTER — Telehealth: Payer: Self-pay | Admitting: Nurse Practitioner

## 2014-02-18 MED ORDER — MEDROXYPROGESTERONE ACETATE 150 MG/ML IM SUSP: 150.0000 mg | Freq: Once | INTRAMUSCULAR | Status: DC

## 2014-02-18 NOTE — Telephone Encounter (Signed)
Her last pap was 2013 and she gets the injections here.

## 2014-02-18 NOTE — Telephone Encounter (Signed)
Not on her med list- who Rx and when was last shot?

## 2014-02-18 NOTE — Telephone Encounter (Signed)
Needs pap- when was last shot?

## 2014-02-18 NOTE — Telephone Encounter (Signed)
Last shot 12/03/13

## 2014-02-21 ENCOUNTER — Ambulatory Visit (INDEPENDENT_AMBULATORY_CARE_PROVIDER_SITE_OTHER): Payer: Medicaid Other | Admitting: *Deleted

## 2014-02-21 DIAGNOSIS — Z3009 Encounter for other general counseling and advice on contraception: Secondary | ICD-10-CM

## 2014-02-21 MED ORDER — MEDROXYPROGESTERONE ACETATE 150 MG/ML IM SUSP
150.0000 mg | Freq: Once | INTRAMUSCULAR | Status: AC
  Administered 2014-02-21: 150 mg via INTRAMUSCULAR

## 2014-03-06 ENCOUNTER — Other Ambulatory Visit: Payer: Self-pay | Admitting: *Deleted

## 2014-03-06 MED ORDER — FLUTICASONE PROPIONATE 50 MCG/ACT NA SUSP: NASAL | Status: DC

## 2014-03-15 ENCOUNTER — Encounter: Payer: Self-pay | Admitting: Nurse Practitioner

## 2014-03-15 ENCOUNTER — Ambulatory Visit (INDEPENDENT_AMBULATORY_CARE_PROVIDER_SITE_OTHER): Payer: Medicaid Other | Admitting: Nurse Practitioner

## 2014-03-15 VITALS — BP 122/79 | HR 101 | Temp 99.1°F | Ht 61.0 in | Wt 238.6 lb

## 2014-03-15 DIAGNOSIS — G43909 Migraine, unspecified, not intractable, without status migrainosus: Secondary | ICD-10-CM

## 2014-03-15 DIAGNOSIS — Z713 Dietary counseling and surveillance: Secondary | ICD-10-CM

## 2014-03-15 DIAGNOSIS — I10 Essential (primary) hypertension: Secondary | ICD-10-CM

## 2014-03-15 DIAGNOSIS — K219 Gastro-esophageal reflux disease without esophagitis: Secondary | ICD-10-CM

## 2014-03-15 DIAGNOSIS — F3162 Bipolar disorder, current episode mixed, moderate: Secondary | ICD-10-CM

## 2014-03-15 DIAGNOSIS — E039 Hypothyroidism, unspecified: Secondary | ICD-10-CM

## 2014-03-15 DIAGNOSIS — R32 Unspecified urinary incontinence: Secondary | ICD-10-CM

## 2014-03-15 DIAGNOSIS — F4001 Agoraphobia with panic disorder: Secondary | ICD-10-CM

## 2014-03-15 DIAGNOSIS — E785 Hyperlipidemia, unspecified: Secondary | ICD-10-CM

## 2014-03-15 NOTE — Patient Instructions (Signed)

## 2014-03-15 NOTE — Progress Notes (Signed)
Subjective:    Patient ID: Dawn Craig, female    DOB: [birth year], 43 y.o.   MRN: [id number]  Patient here today for follow up of chronic medical problems.  Hypertension This is a chronic problem. The current episode started more than 1 year ago. The problem is unchanged. The problem is controlled. Pertinent negatives include no blurred vision, chest pain, palpitations or peripheral edema. Agents associated with hypertension include thyroid hormones. Risk factors for coronary artery disease include dyslipidemia, obesity and sedentary lifestyle. Past treatments include ACE inhibitors. The current treatment provides significant improvement. Hypertensive end-organ damage includes a thyroid problem.  Hyperlipidemia This is a chronic problem. The current episode started more than 1 month ago. The problem is controlled. Recent lipid tests were reviewed and are high. There are no known factors aggravating her hyperlipidemia. Pertinent negatives include no chest pain, focal weakness, leg pain or myalgias. Current antihyperlipidemic treatment includes statins, diet change and exercise. There are no compliance problems.  Risk factors for coronary artery disease include hypertension and obesity.  Thyroid Problem Presents for follow-up (hypothyroidism) visit. Patient reports no constipation, depressed mood, diaphoresis, diarrhea, dry skin, heat intolerance, menstrual problem, palpitations, visual change, weight gain or weight loss. The symptoms have been stable. Her past medical history is significant for hyperlipidemia.  Migraines Chronic problem. Says she has some type of headache everyday. Her last migraine was 2 days ago imitrex not helping anymore. Patient has never been to a headache clinic. Bipolar Chronic problem. Patient is on Lamictal. Takes Klonopin when she goes out because she has panic attacks when she is around a lot of people. Bladder leakage  Started back in November. Happens daily. Has to  wear panty liners daily. Patient says she doesn't feel it sometimes. Has trouble completely emptying bladder. Does C/O urgency Fever blisters Occur weekly lasting 6 days. Can have more than one at the same time. GERD  Patient use to be on zantac but hasn't had Rx in awhile. Needs a refill.  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  Review of Systems  Constitutional: Negative for weight loss, weight gain and diaphoresis.  Eyes: Negative for blurred vision.  Cardiovascular: Negative for chest pain and palpitations.  Gastrointestinal: Negative for diarrhea and constipation.  Endocrine: Negative for heat intolerance.  Genitourinary: Positive for urgency. Negative for dysuria, hematuria, flank pain, menstrual problem and pelvic pain.  Musculoskeletal: Negative for myalgias.  Neurological: Negative for focal weakness.       Objective:   Physical Exam  Constitutional: She is oriented to person, place, and time. She appears well-developed and well-nourished.  HENT:  Right Ear: Hearing, tympanic membrane, external ear and ear canal normal.  Left Ear: Hearing, tympanic membrane, external ear and ear canal normal.  Nose: Mucosal edema and rhinorrhea present. Right sinus exhibits maxillary sinus tenderness. Right sinus exhibits no frontal sinus tenderness. Left sinus exhibits maxillary sinus tenderness. Left sinus exhibits no frontal sinus tenderness.  Mouth/Throat: Uvula is midline, oropharynx is clear and moist and mucous membranes are normal.  Eyes: EOM are normal. Pupils are equal, round, and reactive to light.  Neck: Normal range of motion. Neck supple.  Cardiovascular: Normal rate, normal heart sounds and intact distal pulses.   Pulmonary/Chest: Effort normal and breath sounds normal.  Abdominal: Soft. Bowel sounds are normal. She exhibits no distension. There is no tenderness.  Lymphadenopathy:    She  has  no cervical adenopathy.  Neurological: She is alert and oriented to person, place, and time.  Skin: Skin is warm.  Psychiatric: She has a normal mood and affect. Her behavior is normal. Judgment and thought content normal.  BP 122/79  Pulse 101  Temp(Src) 99.1 F (37.3 C) (Oral)  Ht '5\' 1"'  (1.549 m)  Wt 238 lb 9.6 oz (108.228 kg)  BMI 45.11 kg/m2         Assessment & Plan:   1. Urinary incontinence   2. Migraines   3. Hypothyroidism   4. Hyperlipidemia LDL goal < 100   5. GERD (gastroesophageal reflux disease)   6. Essential hypertension, benign   7. Bipolar 1 disorder, mixed, moderate   8. Agoraphobia with panic attacks   9. Severe obesity (BMI >= 40)   10. Weight loss counseling, encounter for    Orders Placed This Encounter  Procedures  . CMP14+EGFR  . NMR, lipoprofile   Patient to schedule mammogram Labs pending Health maintenance reviewed Diet and exercise encouraged Continue all meds Follow up  In 3 months   [place], FNP

## 2014-03-21 ENCOUNTER — Other Ambulatory Visit: Payer: Medicaid Other

## 2014-03-22 LAB — NMR, LIPOPROFILE
CHOLESTEROL: 105 mg/dL (ref <200)
HDL Cholesterol by NMR: 32 mg/dL — ABNORMAL LOW (ref >=40)
HDL Particle Number: 25.5 umol/L — ABNORMAL LOW (ref >=30.5)
LDL Particle Number: 668 nmol/L (ref <1000)
LDL SIZE: 20.6 nm (ref >20.5)
LDLC SERPL CALC-MCNC: 58 mg/dL (ref <100)
LP-IR Score: 54 — ABNORMAL HIGH (ref <=45)
Small LDL Particle Number: 287 nmol/L (ref <=527)
Triglycerides by NMR: 74 mg/dL (ref <150)

## 2014-03-22 LAB — CMP14+EGFR
ALBUMIN: 4.5 g/dL (ref 3.5–5.5)
ALT: 9 IU/L (ref 0–32)
AST: 16 IU/L (ref 0–40)
Albumin/Globulin Ratio: 2.1 (ref 1.1–2.5)
Alkaline Phosphatase: 60 IU/L (ref 39–117)
BILIRUBIN TOTAL: 1 mg/dL (ref 0.0–1.2)
BUN/Creatinine Ratio: 8 — ABNORMAL LOW (ref 9–23)
BUN: 7 mg/dL (ref 6–24)
CO2: 25 mmol/L (ref 18–29)
CREATININE: 0.84 mg/dL (ref 0.57–1.00)
Calcium: 9.4 mg/dL (ref 8.7–10.2)
Chloride: 100 mmol/L (ref 97–108)
GFR, EST AFRICAN AMERICAN: 99 mL/min/{1.73_m2} (ref >59)
GFR, EST NON AFRICAN AMERICAN: 86 mL/min/{1.73_m2} (ref >59)
GLOBULIN, TOTAL: 2.1 g/dL (ref 1.5–4.5)
GLUCOSE: 90 mg/dL (ref 65–99)
Potassium: 3.7 mmol/L (ref 3.5–5.2)
Sodium: 139 mmol/L (ref 134–144)
Total Protein: 6.6 g/dL (ref 6.0–8.5)

## 2014-04-05 ENCOUNTER — Other Ambulatory Visit: Payer: Self-pay

## 2014-04-05 MED ORDER — LAMOTRIGINE 100 MG PO TABS: 100.0000 mg | ORAL_TABLET | Freq: Every day | ORAL | Status: DC

## 2014-04-05 MED ORDER — LORATADINE 10 MG PO TABS: 10.0000 mg | ORAL_TABLET | Freq: Every day | ORAL | Status: DC

## 2014-04-05 MED ORDER — LOSARTAN POTASSIUM-HCTZ 100-12.5 MG PO TABS: 1.0000 | ORAL_TABLET | Freq: Every day | ORAL | Status: DC

## 2014-04-05 MED ORDER — SOLIFENACIN SUCCINATE 10 MG PO TABS: 10.0000 mg | ORAL_TABLET | Freq: Every day | ORAL | Status: DC

## 2014-04-05 NOTE — Telephone Encounter (Signed)
Last seen 03/15/14  MMM 

## 2014-05-09 ENCOUNTER — Ambulatory Visit: Payer: Medicaid Other

## 2014-05-09 ENCOUNTER — Other Ambulatory Visit: Payer: Self-pay | Admitting: Nurse Practitioner

## 2014-05-10 NOTE — Telephone Encounter (Signed)
Patient last seen in office on 03-15-14. Please advise

## 2014-05-12 ENCOUNTER — Other Ambulatory Visit: Payer: Self-pay | Admitting: Nurse Practitioner

## 2014-05-14 ENCOUNTER — Ambulatory Visit (INDEPENDENT_AMBULATORY_CARE_PROVIDER_SITE_OTHER): Payer: Medicaid Other | Admitting: *Deleted

## 2014-05-14 DIAGNOSIS — IMO0001 Reserved for inherently not codable concepts without codable children: Secondary | ICD-10-CM

## 2014-05-14 DIAGNOSIS — Z309 Encounter for contraceptive management, unspecified: Secondary | ICD-10-CM

## 2014-05-14 MED ORDER — MEDROXYPROGESTERONE ACETATE 150 MG/ML IM SUSP
150.0000 mg | INTRAMUSCULAR | Status: DC
  Administered 2014-05-14 – 2014-08-08 (×2): 150 mg via INTRAMUSCULAR

## 2014-05-14 NOTE — Progress Notes (Signed)
Patient ID: Dawn Craig, female   DOB: 02-Aug-1971, 43 y.o.   MRN: [id number] PT TOLERATED INJ WELL

## 2014-06-19 ENCOUNTER — Ambulatory Visit (INDEPENDENT_AMBULATORY_CARE_PROVIDER_SITE_OTHER): Payer: Medicaid Other | Admitting: Nurse Practitioner

## 2014-06-19 ENCOUNTER — Encounter: Payer: Self-pay | Admitting: Nurse Practitioner

## 2014-06-19 DIAGNOSIS — K219 Gastro-esophageal reflux disease without esophagitis: Secondary | ICD-10-CM

## 2014-06-19 DIAGNOSIS — R635 Abnormal weight gain: Secondary | ICD-10-CM

## 2014-06-19 DIAGNOSIS — L500 Allergic urticaria: Secondary | ICD-10-CM

## 2014-06-19 DIAGNOSIS — G43711 Chronic migraine without aura, intractable, with status migrainosus: Secondary | ICD-10-CM

## 2014-06-19 DIAGNOSIS — M533 Sacrococcygeal disorders, not elsewhere classified: Secondary | ICD-10-CM

## 2014-06-19 DIAGNOSIS — B009 Herpesviral infection, unspecified: Secondary | ICD-10-CM

## 2014-06-19 DIAGNOSIS — F3162 Bipolar disorder, current episode mixed, moderate: Secondary | ICD-10-CM

## 2014-06-19 DIAGNOSIS — R632 Polyphagia: Secondary | ICD-10-CM

## 2014-06-19 DIAGNOSIS — E785 Hyperlipidemia, unspecified: Secondary | ICD-10-CM

## 2014-06-19 DIAGNOSIS — I10 Essential (primary) hypertension: Secondary | ICD-10-CM

## 2014-06-19 DIAGNOSIS — E032 Hypothyroidism due to medicaments and other exogenous substances: Secondary | ICD-10-CM

## 2014-06-19 DIAGNOSIS — F4001 Agoraphobia with panic disorder: Secondary | ICD-10-CM

## 2014-06-19 DIAGNOSIS — F5081 Binge eating disorder: Secondary | ICD-10-CM

## 2014-06-19 MED ORDER — LISDEXAMFETAMINE DIMESYLATE 40 MG PO CAPS: 40.0000 mg | ORAL_CAPSULE | ORAL | Status: DC

## 2014-06-19 NOTE — Patient Instructions (Signed)
Binge Eating Disorder Binge eating is uncontrolled eating of large amounts of food. Binge eating occurs in a discrete period of time and may occur to the point of feeling physically uncomfortable. Binge eating that occurs regularly for 6 months or longer is considered binge eating disorder. People with binge eating disorder do not purge after binge eating.  RISK FACTORS More women than men have binge eating disorder. It usually starts during the teenage years or early 8520s. There is not one cause of binge eating disorder. A number of risk factors have been identified:  Family history of eating disorders. There may be a genetic component to eating disorders. Eating behaviors and learning how to cope with distress may also be learned.  Low self-esteem.  Overweight at a young age.  Frequent, unsuccessful dieting.  Depression or anxiety.  Difficulty coping with emotions or distress.  History of alcohol or drug abuse. SYMPTOMS Symptoms of binge eating disorder include:  Frequently eating large amounts of food very fast.  Frequently eating a lot more than necessary to feel full.  Feeling a loss of control when eating, such as not being able to stop eating.  Feeling bad about overeating. DIAGNOSIS A diagnosis of binge eating disorder is based on established criteria for the disorder. These criteria are listed as follows:  Binge eating 2 times per week or more for 6 months or longer.  Three or more of the following behaviors:  Eating very fast.  Eating until feeling uncomfortable.  Eating a lot of food when not hungry.  Eating alone to hide eating.  Feeling disgusted, depressed, or ashamed about eating.  Fear that binge eating cannot be stopped. TREATMENT Caregivers who usually treat people with binge eating disorders are mental health professionals, such as psychologists, psychiatrists, and clinical social workers. More than one type of treatment often is used. Treatments may  include:  Psychotherapy.  Cognitive behavioral therapy. This helps you recognize your thoughts, beliefs, and emotions that contribute to unhealthy eating habits. Then it helps you change that habit.  Interpersonal psychotherapy. This shows you how to have better relationships with others. You might learn new ways to communicate.  Dialectical behavioral therapy. This type of treatment helps you learn skills to regulate your emotions and tolerate distress without using binge eating.  Antidepressant medications. These medications affect the levels of certain chemicals in the brain. One type is called selective serotonin reuptake inhibitor. It often helps people with binge eating disorder.  Weight-loss programs. These can be important for binge eaters who weigh too much. Losing excess weight can help prevent other health problems, such as diabetes and heart disease. It can also help you feel better about yourself. SEEK IMMEDIATE MEDICAL CARE IF:   You have frequent nausea or you vomit often.  You have chest pain.  You have trouble breathing.  You think about harming yourself or someone else.  You start vomiting on purpose or using other behaviors (excessive exercise, laxatives) to compensate for binges. FOR MORE INFORMATION: National Eating Disorders Association: www.nationaleatingdisorders.org Document Released: 04/07/2011 Document Revised: 02/07/2012 Document Reviewed: 04/07/2011 [facility] Patient Information 2015 [place], [place]. This information is not intended to replace advice given to you by your health care provider. Make sure you discuss any questions you have with your health care provider.

## 2014-06-19 NOTE — Progress Notes (Signed)
Subjective:    Patient ID: Dawn Craig, female    DOB: 1971-02-01, 43 y.o.   MRN: [id number]  Patient here today for follow up of chronic medical problems. C/o pain in sacroliliac joints- ain gets severe at times- says tat it causes her to fall at times. SHe is breaking out in hives frequently and nit sure what it is coming from. She  Has gained weight and is binge eating- she will sometimes it until she throws up.  Hypertension This is a chronic problem. The current episode started more than 1 year ago. The problem is unchanged. The problem is controlled. Pertinent negatives include no blurred vision, chest pain, palpitations or peripheral edema. Agents associated with hypertension include thyroid hormones. Risk factors for coronary artery disease include dyslipidemia, obesity and sedentary lifestyle. Past treatments include ACE inhibitors. The current treatment provides significant improvement. Hypertensive end-organ damage includes a thyroid problem.  Hyperlipidemia This is a chronic problem. The current episode started more than 1 month ago. The problem is controlled. Recent lipid tests were reviewed and are high. There are no known factors aggravating her hyperlipidemia. Pertinent negatives include no chest pain, focal weakness, leg pain or myalgias. Current antihyperlipidemic treatment includes statins, diet change and exercise. There are no compliance problems.  Risk factors for coronary artery disease include hypertension and obesity.  Thyroid Problem Presents for follow-up (hypothyroidism) visit. Patient reports no constipation, depressed mood, diaphoresis, diarrhea, dry skin, heat intolerance, menstrual problem, palpitations, visual change, weight gain or weight loss. The symptoms have been stable. Her past medical history is significant for hyperlipidemia.  Migraines Chronic problem. Says she has some type of headache everyday. Her last migraine was 2 days ago imitrex not helping anymore.  Patient has never been to a headache clinic. Bipolar Chronic problem. Patient is on Lamictal. Takes Klonopin when she goes out because she has panic attacks when she is around a lot of people. Bladder leakage  Started back in November. Happens daily. Has to wear panty liners daily. Patient says she doesn't feel it sometimes. Has trouble completely emptying bladder. Does C/O urgency Fever blisters Occur weekly lasting 6 days. Can have more than one at the same time. GERD  Patient use to be on zantac but hasn't had Rx in awhile. Needs a refill.  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  Review of Systems  Constitutional: Negative for weight loss, weight gain and diaphoresis.  Eyes: Negative for blurred vision.  Cardiovascular: Negative for chest pain and palpitations.  Gastrointestinal: Negative for diarrhea and constipation.  Endocrine: Negative for heat intolerance.  Genitourinary: Positive for urgency. Negative for dysuria, hematuria, flank pain, menstrual problem and pelvic pain.  Musculoskeletal: Negative for myalgias.  Neurological: Negative for focal weakness.       Objective:   Physical Exam  Constitutional: She is oriented to person, place, and time. She appears well-developed and well-nourished.  HENT:  Right Ear: Hearing, tympanic membrane, external ear and ear canal normal.  Left Ear: Hearing, tympanic membrane, external ear and ear canal normal.  Nose: Mucosal edema and rhinorrhea present. Right sinus exhibits maxillary sinus tenderness. Right sinus exhibits no frontal sinus tenderness. Left sinus exhibits maxillary sinus tenderness. Left sinus exhibits no frontal sinus tenderness.  Mouth/Throat: Uvula is midline, oropharynx is clear and moist and mucous membranes are normal.  Eyes: EOM are normal. Pupils are equal, round, and reactive  to light.  Neck: Normal range of motion. Neck supple.   Cardiovascular: Normal rate, normal heart sounds and intact distal pulses.   Pulmonary/Chest: Effort normal and breath sounds normal.  Abdominal: Soft. Bowel sounds are normal. She exhibits no distension. There is no tenderness.  Lymphadenopathy:    She has no cervical adenopathy.  Neurological: She is alert and oriented to person, place, and time.  Skin: Skin is warm.  Psychiatric: She has a normal mood and affect. Her behavior is normal. Judgment and thought content normal.  BP 128/84  Pulse 106  Temp(Src) 98.1 F (36.7 C) (Oral)  Ht '5\' 1"'  (1.549 m)  Wt 245 lb (111.131 kg)  BMI 46.32 kg/m2  Binge eating questionaire-  1. Excessive over eating in the last 3 months- YES  2. Do you feel distressed about over eating- YES  3. How often do you feel you have no control over your eating? Always  4. How often do you continue eating even when you are not hungry? Always  5. How often are you embarrassed about how much you ate? Often  6. How often do you feel disgusted or guilty? Always  7.How often have you made yourself throw up after over eating? Never       Assessment & Plan:  1. Severe obesity (BMI >= 40) 2. 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 Hyperlipidemia with target LDL less than 100 - NMR, lipoprofile 4. Gastroesophageal reflux disease, esophagitis presence not specified Avoid over eating 5. Essential hypertension, benign Low Na+ diet - CMP14+EGFR 6. Bipolar 1 disorder, mixed, moderate Stress management 7. 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 Intractable chronic migraine without aura and with status migrainosus Avoid caffeine 10. Sacro ilial pain Patient wants to see chiropractor 11. Binge-eating disorder, severe Discussed in great length Patient encouraged to be conscous about what she is eating - lisdexamfetamine (VYVANSE) 40 MG capsule; Take 1 capsule (40 mg total) by  mouth every morning.  Dispense: 30 capsule; Refill: 0 - lisdexamfetamine (VYVANSE) 40 MG capsule; Take 1 capsule (40 mg total) by mouth every morning.  Dispense: 30 capsule; Refill: 0 12. Allergic urticaria Keep food diary so we can figure out what is causing reaction  Health maintenance reviewed Diet and exercise encouraged RTO in 2 months to see if vyvanse is helping Labs pending  Mary-Margaret Hassell Done, FNP

## 2014-06-20 LAB — CMP14+EGFR
ALBUMIN: 4.6 g/dL (ref 3.5–5.5)
ALK PHOS: 64 IU/L (ref 39–117)
ALT: 12 IU/L (ref 0–32)
AST: 14 IU/L (ref 0–40)
Albumin/Globulin Ratio: 2.1 (ref 1.1–2.5)
BUN / CREAT RATIO: 9 (ref 9–23)
BUN: 7 mg/dL (ref 6–24)
CO2: 23 mmol/L (ref 18–29)
Calcium: 9.1 mg/dL (ref 8.7–10.2)
Chloride: 101 mmol/L (ref 97–108)
Creatinine, Ser: 0.81 mg/dL (ref 0.57–1.00)
GFR calc Af Amer: 104 mL/min/{1.73_m2} (ref >59)
GFR calc non Af Amer: 90 mL/min/{1.73_m2} (ref >59)
Globulin, Total: 2.2 g/dL (ref 1.5–4.5)
Glucose: 109 mg/dL — ABNORMAL HIGH (ref 65–99)
POTASSIUM: 4.1 mmol/L (ref 3.5–5.2)
Sodium: 141 mmol/L (ref 134–144)
Total Bilirubin: 0.8 mg/dL (ref 0.0–1.2)
Total Protein: 6.8 g/dL (ref 6.0–8.5)

## 2014-06-20 LAB — NMR, LIPOPROFILE
CHOLESTEROL: 126 mg/dL (ref 100–199)
HDL Cholesterol by NMR: 41 mg/dL (ref >39)
HDL Particle Number: 30.5 umol/L (ref >=30.5)
LDL PARTICLE NUMBER: 937 nmol/L (ref <1000)
LDL SIZE: 20.5 nm (ref >20.5)
LDLC SERPL CALC-MCNC: 69 mg/dL (ref 0–99)
LP-IR SCORE: 66 — AB (ref <=45)
Small LDL Particle Number: 487 nmol/L (ref <=527)
Triglycerides by NMR: 81 mg/dL (ref 0–149)

## 2014-06-20 LAB — THYROID PANEL WITH TSH
FREE THYROXINE INDEX: 2.9 (ref 1.2–4.9)
T3 UPTAKE RATIO: 28 % (ref 24–39)
T4, Total: 10.3 ug/dL (ref 4.5–12.0)
TSH: 1.53 u[IU]/mL (ref 0.450–4.500)

## 2014-07-05 ENCOUNTER — Other Ambulatory Visit: Payer: Self-pay | Admitting: Nurse Practitioner

## 2014-07-08 ENCOUNTER — Other Ambulatory Visit: Payer: Self-pay | Admitting: Nurse Practitioner

## 2014-08-07 ENCOUNTER — Other Ambulatory Visit: Payer: Self-pay | Admitting: Nurse Practitioner

## 2014-08-07 ENCOUNTER — Telehealth: Payer: Self-pay | Admitting: Nurse Practitioner

## 2014-08-07 NOTE — Telephone Encounter (Signed)
Patient has appointment in 9/10 for depo

## 2014-08-08 ENCOUNTER — Ambulatory Visit (INDEPENDENT_AMBULATORY_CARE_PROVIDER_SITE_OTHER): Payer: Medicaid Other

## 2014-08-08 DIAGNOSIS — Z3042 Encounter for surveillance of injectable contraceptive: Secondary | ICD-10-CM

## 2014-08-08 DIAGNOSIS — Z3049 Encounter for surveillance of other contraceptives: Secondary | ICD-10-CM

## 2014-08-21 ENCOUNTER — Ambulatory Visit (INDEPENDENT_AMBULATORY_CARE_PROVIDER_SITE_OTHER): Payer: Medicaid Other | Admitting: Nurse Practitioner

## 2014-08-21 ENCOUNTER — Encounter: Payer: Self-pay | Admitting: Nurse Practitioner

## 2014-08-21 VITALS — BP 144/83 | HR 105 | Temp 99.6°F | Wt 241.8 lb

## 2014-08-21 DIAGNOSIS — G43711 Chronic migraine without aura, intractable, with status migrainosus: Secondary | ICD-10-CM

## 2014-08-21 DIAGNOSIS — E785 Hyperlipidemia, unspecified: Secondary | ICD-10-CM

## 2014-08-21 DIAGNOSIS — N3941 Urge incontinence: Secondary | ICD-10-CM

## 2014-08-21 DIAGNOSIS — F4001 Agoraphobia with panic disorder: Secondary | ICD-10-CM

## 2014-08-21 DIAGNOSIS — E032 Hypothyroidism due to medicaments and other exogenous substances: Secondary | ICD-10-CM

## 2014-08-21 DIAGNOSIS — F3162 Bipolar disorder, current episode mixed, moderate: Secondary | ICD-10-CM

## 2014-08-21 DIAGNOSIS — K219 Gastro-esophageal reflux disease without esophagitis: Secondary | ICD-10-CM

## 2014-08-21 DIAGNOSIS — F509 Eating disorder, unspecified: Secondary | ICD-10-CM

## 2014-08-21 DIAGNOSIS — J3089 Other allergic rhinitis: Secondary | ICD-10-CM

## 2014-08-21 DIAGNOSIS — F5081 Binge eating disorder: Secondary | ICD-10-CM | POA: Insufficient documentation

## 2014-08-21 DIAGNOSIS — B009 Herpesviral infection, unspecified: Secondary | ICD-10-CM

## 2014-08-21 DIAGNOSIS — J302 Other seasonal allergic rhinitis: Secondary | ICD-10-CM

## 2014-08-21 DIAGNOSIS — I10 Essential (primary) hypertension: Secondary | ICD-10-CM

## 2014-08-21 MED ORDER — LISDEXAMFETAMINE DIMESYLATE 50 MG PO CAPS: 50.0000 mg | ORAL_CAPSULE | Freq: Every day | ORAL | Status: DC

## 2014-08-21 MED ORDER — CLONAZEPAM 0.5 MG PO TABS: ORAL_TABLET | ORAL | Status: DC

## 2014-08-21 MED ORDER — CLONAZEPAM 0.5 MG PO TABS
ORAL_TABLET | ORAL | Status: DC
Start: 2014-08-21 — End: 2014-11-25

## 2014-08-21 MED ORDER — CETIRIZINE HCL 10 MG PO TABS: 10.0000 mg | ORAL_TABLET | Freq: Every day | ORAL | Status: DC

## 2014-08-21 MED ORDER — LISDEXAMFETAMINE DIMESYLATE 50 MG PO CAPS: 50.0000 mg | ORAL_CAPSULE | ORAL | Status: DC

## 2014-08-21 MED ORDER — MIRABEGRON ER 25 MG PO TB24: 25.0000 mg | ORAL_TABLET | Freq: Every day | ORAL | Status: DC

## 2014-08-21 NOTE — Progress Notes (Signed)
Subjective:    Patient ID: Dawn Craig, female    DOB: 11-Jun-1971, 43 y.o.   MRN: [id number]  Patient is here today for chronic visit follow up. No acute complaint today.   Hypertension This is a chronic problem. The current episode started more than 1 year ago. The problem is unchanged. The problem is controlled. Pertinent negatives include no blurred vision, chest pain, palpitations or peripheral edema. Agents associated with hypertension include thyroid hormones. Risk factors for coronary artery disease include dyslipidemia, obesity and sedentary lifestyle. Past treatments include ACE inhibitors. The current treatment provides significant improvement. Hypertensive end-organ damage includes a thyroid problem.  Hyperlipidemia This is a chronic problem. The current episode started more than 1 month ago. The problem is controlled. Recent lipid tests were reviewed and are high. There are no known factors aggravating her hyperlipidemia. Pertinent negatives include no chest pain, focal weakness, leg pain or myalgias. Current antihyperlipidemic treatment includes statins, diet change and exercise. There are no compliance problems.  Risk factors for coronary artery disease include hypertension and obesity.  Thyroid Problem Presents for follow-up (hypothyroidism) visit. Patient reports no constipation, depressed mood, diaphoresis, diarrhea, dry skin, heat intolerance, menstrual problem, palpitations, visual change, weight gain or weight loss. The symptoms have been stable. Her past medical history is significant for hyperlipidemia.  Migraines Chronic problem. Says she has some type of headache everyday. Her last migraine was 4  days ago imitrex not helping anymore. Patient has not been to a headache clinic. Bipolar Chronic problem. Patient is on Lamictal. Takes Klonopin when she goes out because she has panic attacks when she is around a lot of people. Bladder leakage  Started back in November. Happens  daily. Has to wear panty liners daily. Patient says she doesn't feel it sometimes. Has trouble completely emptying bladder. Does C/O urgency Fever blisters Occur weekly lasting 6 days. Can have more than one at the same time. GERD  Patient use to be on zantac PRN.   *Patient is seeing a chiropractor for her back and neck.    Review of Systems  Constitutional: Negative for weight loss, weight gain and diaphoresis.  Eyes: Negative for blurred vision.  Respiratory: Negative.   Cardiovascular: Negative for chest pain and palpitations.  Gastrointestinal: Negative for diarrhea and constipation.  Endocrine: Negative for heat intolerance.  Genitourinary: Positive for urgency. Negative for dysuria, hematuria, flank pain, menstrual problem and pelvic pain.  Musculoskeletal: Negative for myalgias.  Neurological: Negative for focal weakness.       Objective:   Physical Exam  Constitutional: She is oriented to person, place, and time. She appears well-developed and well-nourished.  HENT:  Right Ear: Hearing, tympanic membrane, external ear and ear canal normal.  Left Ear: Hearing, tympanic membrane, external ear and ear canal normal.  Nose: Right sinus exhibits no frontal sinus tenderness. Left sinus exhibits no frontal sinus tenderness.  Mouth/Throat: Uvula is midline, oropharynx is clear and moist and mucous membranes are normal.  Eyes: EOM are normal. Pupils are equal, round, and reactive to light.  Neck: Normal range of motion. Neck supple.  Cardiovascular: Normal rate, normal heart sounds and intact distal pulses.   Pulmonary/Chest: Effort normal and breath sounds normal.  Abdominal: Soft. Bowel sounds are normal. She exhibits no distension. There is no tenderness.  Lymphadenopathy:    She has no cervical adenopathy.  Neurological: She is alert and oriented to person, place, and time.  Skin: Skin is warm.  Psychiatric: She has a normal mood and affect. Her  behavior is normal. Judgment  and thought content normal.    BP 144/83  Pulse 105  Temp(Src) 99.6 F (37.6 C) (Oral)  Wt 241 lb 12.8 oz (109.68 kg)         Assessment & Plan:   1. 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 Hyperlipidemia with target LDL less than 100 Low fat diet - CMP14+EGFR - NMR, lipoprofile  4. HSV-1 (herpes simplex virus 1) infection  5. Gastroesophageal reflux disease, esophagitis presence not specified Do not eat 3 hours prior to bedtime Avoid spicy food  6. Essential hypertension, benign Low Na+ det  7. Bipolar 1 disorder, mixed, moderate Continue counseling - clonazePAM (KLONOPIN) 0.5 MG tablet; TAKE 1 TABLET TWICE A DAY  Dispense: 60 tablet; Refill: 2  8. Severe obesity (BMI >= 40) Discussed diet and exercise for person with BMI >25 Will recheck weight in 3-6 months  9. Urge incontinence of urine Changed from vesicare which was nt working to [employer] - mirabegron ER (MYRBETRIQ) 25 MG TB24 tablet; Take 1 tablet (25 mg total) by mouth daily.  Dispense: 30 tablet; Refill: 0  10. Other seasonal allergic rhinitis Changed from claritin to zyrtec - cetirizine (ZYRTEC) 10 MG tablet; Take 1 tablet (10 mg total) by mouth daily.  Dispense: 30 tablet; Refill: 11  11. Binge eating disorder Increased dose of vyvanse from 13m to 596m- lisdexamfetamine (VYVANSE) 50 MG capsule; Take 1 capsule (50 mg total) by mouth daily.  Dispense: 30 capsule; Refill: 0 - lisdexamfetamine (VYVANSE) 50 MG capsule; Take 1 capsule (50 mg total) by mouth every morning.  Dispense: 30 capsule; Refill: 0  12. Agoraphobia with panic attacks  Labs pending Health maintenance reviewed Diet and exercise encouraged Continue all meds Follow up  In 3 months   MaDentonFNP

## 2014-08-21 NOTE — Patient Instructions (Addendum)
Binge Eating Disorder Binge eating is uncontrolled eating of large amounts of food. Binge eating occurs in a discrete period of time and may occur to the point of feeling physically uncomfortable. Binge eating that occurs regularly for 6 months or longer is considered binge eating disorder. People with binge eating disorder do not purge after binge eating.  RISK FACTORS More women than men have binge eating disorder. It usually starts during the teenage years or early 87s. There is not one cause of binge eating disorder. A number of risk factors have been identified:  Family history of eating disorders. There may be a genetic component to eating disorders. Eating behaviors and learning how to cope with distress may also be learned.  Low self-esteem.  Overweight at a young age.  Frequent, unsuccessful dieting.  Depression or anxiety.  Difficulty coping with emotions or distress.  History of alcohol or drug abuse. SYMPTOMS Symptoms of binge eating disorder include:  Frequently eating large amounts of food very fast.  Frequently eating a lot more than necessary to feel full.  Feeling a loss of control when eating, such as not being able to stop eating.  Feeling bad about overeating. DIAGNOSIS A diagnosis of binge eating disorder is based on established criteria for the disorder. These criteria are listed as follows:  Binge eating 2 times per week or more for 6 months or longer.  Three or more of the following behaviors:  Eating very fast.  Eating until feeling uncomfortable.  Eating a lot of food when not hungry.  Eating alone to hide eating.  Feeling disgusted, depressed, or ashamed about eating.  Fear that binge eating cannot be stopped. TREATMENT Caregivers who usually treat people with binge eating disorders are mental health professionals, such as psychologists, psychiatrists, and clinical social workers. More than one type of treatment often is used. Treatments may  include:  Psychotherapy.  Cognitive behavioral therapy. This helps you recognize your thoughts, beliefs, and emotions that contribute to unhealthy eating habits. Then it helps you change that habit.  Interpersonal psychotherapy. This shows you how to have better relationships with others. You might learn new ways to communicate.  Dialectical behavioral therapy. This type of treatment helps you learn skills to regulate your emotions and tolerate distress without using binge eating.  Antidepressant medications. These medications affect the levels of certain chemicals in the brain. One type is called selective serotonin reuptake inhibitor. It often helps people with binge eating disorder.  Weight-loss programs. These can be important for binge eaters who weigh too much. Losing excess weight can help prevent other health problems, such as diabetes and heart disease. It can also help you feel better about yourself. SEEK IMMEDIATE MEDICAL CARE IF:   You have frequent nausea or you vomit often.  You have chest pain.  You have trouble breathing.  You think about harming yourself or someone else.  You start vomiting on purpose or using other behaviors (excessive exercise, laxatives) to compensate for binges. FOR MORE INFORMATION: National Eating Disorders Association: www.nationaleatingdisorders.org Document Released: 04/07/2011 Document Revised: 02/07/2012 Document Reviewed: 04/07/2011 [facility] Patient Information 2015 [place], [place]. This information is not intended to replace advice given to you by your health care provider. Make sure you discuss any questions you have with your health care provider.

## 2014-08-22 LAB — NMR, LIPOPROFILE
Cholesterol: 121 mg/dL (ref 100–199)
HDL CHOLESTEROL BY NMR: 39 mg/dL — AB (ref >39)
HDL Particle Number: 29 umol/L — ABNORMAL LOW (ref >=30.5)
LDL PARTICLE NUMBER: 731 nmol/L (ref <1000)
LDL Size: 20.5 nm (ref >20.5)
LDLC SERPL CALC-MCNC: 62 mg/dL (ref 0–99)
LP-IR Score: 65 — ABNORMAL HIGH (ref <=45)
Small LDL Particle Number: 321 nmol/L (ref <=527)
Triglycerides by NMR: 98 mg/dL (ref 0–149)

## 2014-08-22 LAB — CMP14+EGFR
ALT: 12 IU/L (ref 0–32)
AST: 16 IU/L (ref 0–40)
Albumin/Globulin Ratio: 2 (ref 1.1–2.5)
Albumin: 4.5 g/dL (ref 3.5–5.5)
Alkaline Phosphatase: 70 IU/L (ref 39–117)
BUN/Creatinine Ratio: 9 (ref 9–23)
BUN: 7 mg/dL (ref 6–24)
CALCIUM: 9.7 mg/dL (ref 8.7–10.2)
CO2: 24 mmol/L (ref 18–29)
Chloride: 100 mmol/L (ref 97–108)
Creatinine, Ser: 0.79 mg/dL (ref 0.57–1.00)
GFR calc Af Amer: 107 mL/min/{1.73_m2} (ref >59)
GFR calc non Af Amer: 93 mL/min/{1.73_m2} (ref >59)
GLUCOSE: 110 mg/dL — AB (ref 65–99)
Globulin, Total: 2.3 g/dL (ref 1.5–4.5)
POTASSIUM: 3.8 mmol/L (ref 3.5–5.2)
Sodium: 140 mmol/L (ref 134–144)
TOTAL PROTEIN: 6.8 g/dL (ref 6.0–8.5)
Total Bilirubin: 0.7 mg/dL (ref 0.0–1.2)

## 2014-08-27 ENCOUNTER — Other Ambulatory Visit: Payer: Self-pay | Admitting: Nurse Practitioner

## 2014-09-08 ENCOUNTER — Other Ambulatory Visit: Payer: Self-pay | Admitting: Family Medicine

## 2014-09-19 ENCOUNTER — Other Ambulatory Visit: Payer: Self-pay | Admitting: *Deleted

## 2014-09-19 ENCOUNTER — Other Ambulatory Visit: Payer: Self-pay | Admitting: Nurse Practitioner

## 2014-09-19 ENCOUNTER — Telehealth: Payer: Self-pay | Admitting: Family Medicine

## 2014-09-19 DIAGNOSIS — N3941 Urge incontinence: Secondary | ICD-10-CM

## 2014-09-19 MED ORDER — MIRABEGRON ER 25 MG PO TB24: 25.0000 mg | ORAL_TABLET | Freq: Every day | ORAL | Status: DC

## 2014-09-19 NOTE — Telephone Encounter (Signed)
Lm, patient aware sample at front.

## 2014-09-23 NOTE — Telephone Encounter (Signed)
Last seen 08/21/14 MMM 

## 2014-10-07 ENCOUNTER — Other Ambulatory Visit: Payer: Self-pay | Admitting: Nurse Practitioner

## 2014-10-08 NOTE — Telephone Encounter (Addendum)
Pt needs refill on Lamictal, she has appt scheduled with you on 12/28 15, last ordered 06/2014 #30 with 2 refills.

## 2014-10-18 ENCOUNTER — Telehealth: Payer: Self-pay | Admitting: Nurse Practitioner

## 2014-10-18 DIAGNOSIS — N3941 Urge incontinence: Secondary | ICD-10-CM

## 2014-10-18 MED ORDER — MIRABEGRON ER 25 MG PO TB24: 25.0000 mg | ORAL_TABLET | Freq: Every day | ORAL | Status: DC

## 2014-10-18 NOTE — Telephone Encounter (Signed)
What will insurance pay for? 

## 2014-10-18 NOTE — Telephone Encounter (Signed)
Patient aware samples up front to be picked up.  

## 2014-10-21 ENCOUNTER — Other Ambulatory Visit: Payer: Self-pay | Admitting: *Deleted

## 2014-10-21 ENCOUNTER — Other Ambulatory Visit: Payer: Self-pay | Admitting: Nurse Practitioner

## 2014-10-21 DIAGNOSIS — F5081 Binge eating disorder: Secondary | ICD-10-CM

## 2014-10-21 MED ORDER — LISDEXAMFETAMINE DIMESYLATE 50 MG PO CAPS: 50.0000 mg | ORAL_CAPSULE | Freq: Every day | ORAL | Status: DC

## 2014-10-21 NOTE — Telephone Encounter (Signed)
Last filled and seen 08/21/14

## 2014-10-21 NOTE — Telephone Encounter (Signed)
vyvanse rx ready for pick up  

## 2014-10-21 NOTE — Telephone Encounter (Signed)
Patient came by today for script.

## 2014-10-28 ENCOUNTER — Ambulatory Visit (INDEPENDENT_AMBULATORY_CARE_PROVIDER_SITE_OTHER): Payer: Medicaid Other | Admitting: *Deleted

## 2014-10-28 ENCOUNTER — Ambulatory Visit: Payer: Medicaid Other

## 2014-10-28 ENCOUNTER — Other Ambulatory Visit: Payer: Self-pay | Admitting: Nurse Practitioner

## 2014-10-28 DIAGNOSIS — Z3042 Encounter for surveillance of injectable contraceptive: Secondary | ICD-10-CM

## 2014-10-28 DIAGNOSIS — Z23 Encounter for immunization: Secondary | ICD-10-CM

## 2014-10-28 DIAGNOSIS — I10 Essential (primary) hypertension: Secondary | ICD-10-CM

## 2014-10-28 MED ORDER — LOSARTAN POTASSIUM 100 MG PO TABS: 100.0000 mg | ORAL_TABLET | Freq: Every day | ORAL | Status: DC

## 2014-10-28 MED ORDER — HYDROCHLOROTHIAZIDE 12.5 MG PO TABS
12.5000 mg | ORAL_TABLET | Freq: Every day | ORAL | Status: DC
Start: 2014-10-28 — End: 2014-11-25

## 2014-10-28 MED ORDER — MEDROXYPROGESTERONE ACETATE 150 MG/ML IM SUSP
150.0000 mg | Freq: Once | INTRAMUSCULAR | Status: AC
  Administered 2014-10-28: 150 mg via INTRAMUSCULAR

## 2014-11-03 ENCOUNTER — Other Ambulatory Visit: Payer: Self-pay | Admitting: Nurse Practitioner

## 2014-11-14 ENCOUNTER — Telehealth: Payer: Self-pay | Admitting: Nurse Practitioner

## 2014-11-14 NOTE — Telephone Encounter (Signed)
Aware,samples  at front.

## 2014-11-20 ENCOUNTER — Telehealth: Payer: Self-pay | Admitting: Nurse Practitioner

## 2014-11-20 DIAGNOSIS — F5081 Binge eating disorder: Secondary | ICD-10-CM

## 2014-11-21 MED ORDER — LISDEXAMFETAMINE DIMESYLATE 50 MG PO CAPS: 50.0000 mg | ORAL_CAPSULE | Freq: Every day | ORAL | Status: DC

## 2014-11-21 NOTE — Telephone Encounter (Signed)
vyvanse rx ready for pick up  

## 2014-11-25 ENCOUNTER — Ambulatory Visit (INDEPENDENT_AMBULATORY_CARE_PROVIDER_SITE_OTHER): Payer: Medicaid Other | Admitting: Nurse Practitioner

## 2014-11-25 ENCOUNTER — Encounter: Payer: Self-pay | Admitting: Nurse Practitioner

## 2014-11-25 ENCOUNTER — Encounter (INDEPENDENT_AMBULATORY_CARE_PROVIDER_SITE_OTHER): Payer: Self-pay

## 2014-11-25 VITALS — BP 166/78 | HR 134 | Temp 98.0°F | Ht 61.0 in | Wt 238.0 lb

## 2014-11-25 DIAGNOSIS — K219 Gastro-esophageal reflux disease without esophagitis: Secondary | ICD-10-CM

## 2014-11-25 DIAGNOSIS — E039 Hypothyroidism, unspecified: Secondary | ICD-10-CM

## 2014-11-25 DIAGNOSIS — F4001 Agoraphobia with panic disorder: Secondary | ICD-10-CM

## 2014-11-25 DIAGNOSIS — E785 Hyperlipidemia, unspecified: Secondary | ICD-10-CM

## 2014-11-25 DIAGNOSIS — F3162 Bipolar disorder, current episode mixed, moderate: Secondary | ICD-10-CM

## 2014-11-25 DIAGNOSIS — N3941 Urge incontinence: Secondary | ICD-10-CM

## 2014-11-25 DIAGNOSIS — I10 Essential (primary) hypertension: Secondary | ICD-10-CM

## 2014-11-25 DIAGNOSIS — G43711 Chronic migraine without aura, intractable, with status migrainosus: Secondary | ICD-10-CM

## 2014-11-25 MED ORDER — LOSARTAN POTASSIUM-HCTZ 100-25 MG PO TABS: 1.0000 | ORAL_TABLET | Freq: Every day | ORAL | Status: DC

## 2014-11-25 MED ORDER — LAMOTRIGINE 100 MG PO TABS: ORAL_TABLET | ORAL | Status: DC

## 2014-11-25 MED ORDER — RANITIDINE HCL 150 MG PO TABS: 150.0000 mg | ORAL_TABLET | Freq: Two times a day (BID) | ORAL | Status: DC

## 2014-11-25 MED ORDER — LEVOTHYROXINE SODIUM 88 MCG PO TABS: ORAL_TABLET | ORAL | Status: DC

## 2014-11-25 MED ORDER — CLONAZEPAM 0.5 MG PO TABS: ORAL_TABLET | ORAL | Status: DC

## 2014-11-25 MED ORDER — PRAVASTATIN SODIUM 40 MG PO TABS: ORAL_TABLET | ORAL | Status: DC

## 2014-11-25 MED ORDER — MIRABEGRON ER 25 MG PO TB24: 25.0000 mg | ORAL_TABLET | Freq: Every day | ORAL | Status: DC

## 2014-11-25 MED ORDER — MEDROXYPROGESTERONE ACETATE 150 MG/ML IM SUSP: INTRAMUSCULAR | Status: DC

## 2014-11-25 NOTE — Progress Notes (Signed)
Subjective:    Patient ID: Dawn Craig, female    DOB: [birth year], 43 y.o.   MRN: [id number]  Patient is here today for chronic visit follow up. No acute complaint today. Patient says that she has been having a lot of panic attacks with stress. Having frequent headaches. Stress is causing her to over eat.  Hypertension This is a chronic problem. The current episode started more than 1 year ago. The problem is unchanged. The problem is controlled. Pertinent negatives include no chest pain or palpitations. Risk factors for coronary artery disease include dyslipidemia, obesity and sedentary lifestyle. Past treatments include diuretics and angiotensin blockers. The current treatment provides mild improvement. Compliance problems include diet and exercise.  Hypertensive end-organ damage includes a thyroid problem.  Hyperlipidemia This is a chronic problem. The current episode started more than 1 year ago. The problem is uncontrolled. Recent lipid tests were reviewed and are variable. Pertinent negatives include no chest pain or myalgias. Current antihyperlipidemic treatment includes statins. Compliance problems include adherence to diet and adherence to exercise.  Risk factors for coronary artery disease include dyslipidemia, hypertension and obesity.  Thyroid Problem Presents for follow-up (hypothyroidism) visit. Patient reports no constipation, diaphoresis, diarrhea, heat intolerance, menstrual problem, palpitations or visual change. The symptoms have been stable. Her past medical history is significant for hyperlipidemia.  Migraines Chronic problem. Says she has some type of headache everyday. Her last migraine was 4  days ago imitrex not helping anymore. Patient has not been to a headache clinic. Bipolar Chronic problem. Patient is on Lamictal. Takes Klonopin when she goes out because she has panic attacks when she is around a lot of people. Bladder leakage  Started back in November. Happens daily.  Has to wear panty liners daily. Patient says she doesn't feel it sometimes. Has trouble completely emptying bladder. Does C/O urgency Fever blisters Occur weekly lasting 6 days. Can have more than one at the same time. GERD  Patient use to be on zantac PRN.     Review of Systems  Constitutional: Negative for diaphoresis.  Respiratory: Negative.   Cardiovascular: Negative for chest pain and palpitations.  Gastrointestinal: Negative for diarrhea and constipation.  Endocrine: Negative for heat intolerance.  Genitourinary: Positive for urgency. Negative for dysuria, hematuria, flank pain, menstrual problem and pelvic pain.  Musculoskeletal: Negative for myalgias.       Objective:   Physical Exam  Constitutional: She is oriented to person, place, and time. She appears well-developed and well-nourished.  HENT:  Nose: Nose normal.  Mouth/Throat: Oropharynx is clear and moist.  Eyes: EOM are normal.  Neck: Trachea normal, normal range of motion and full passive range of motion without pain. Neck supple. No JVD present. Carotid bruit is not present. No thyromegaly present.  Cardiovascular: Normal rate, regular rhythm, normal heart sounds and intact distal pulses.  Exam reveals no gallop and no friction rub.   No murmur heard. Pulmonary/Chest: Effort normal and breath sounds normal.  Abdominal: Soft. Bowel sounds are normal. She exhibits no distension and no mass. There is no tenderness.  Musculoskeletal: Normal range of motion.  Lymphadenopathy:    She has no cervical adenopathy.  Neurological: She is alert and oriented to person, place, and time. She has normal reflexes.  Skin: Skin is warm and dry.  Psychiatric: She has a normal mood and affect. Her behavior is normal. Judgment and thought content normal.    BP 166/78 mmHg  Pulse 134  Temp(Src) 98 F (36.7 C) (Oral)  Ht 5'  1" (1.549 m)  Wt 238 lb (107.956 kg)  BMI 44.99 kg/m2           Assessment & Plan:   1. Bipolar 1  disorder, mixed, moderate Continue current meds - clonazePAM (KLONOPIN) 0.5 MG tablet; TAKE 1 TABLET TWICE A DAY  Dispense: 60 tablet; Refill: 2 - lamoTRIgine (LAMICTAL) 100 MG tablet; TAKE 1 TABLET (100 MG TOTAL) BY MOUTH DAILY.  Dispense: 90 tablet; Refill: 1  2. Gastroesophageal reflux disease without esophagitis Avoid spicy and fatty foods Do not eat 2 hours prior to bedtime - ranitidine (ZANTAC) 150 MG tablet; Take 1 tablet (150 mg total) by mouth 2 (two) times daily.  Dispense: 90 tablet; Refill: 1  3. Urge incontinence of urine - mirabegron ER (MYRBETRIQ) 25 MG TB24 tablet; Take 1 tablet (25 mg total) by mouth daily.  Dispense: 90 tablet; Refill: 1  4. Essential hypertension, benign Do not add salt to diet - losartan-hydrochlorothiazide (HYZAAR) 100-25 MG per tablet; Take 1 tablet by mouth daily.  Dispense: 90 tablet; Refill: 1 - CMP14+EGFR  5. Intractable chronic migraine without aura and with status migrainosus Avoid caffeine  6. Agoraphobia with panic attacks Stress amangement  7. Hyperlipidemia with target LDL less than 100 Low fat diet - pravastatin (PRAVACHOL) 40 MG tablet; TAKE 1 TABLET (40 MG TOTAL) BY MOUTH DAILY.  Dispense: 90 tablet; Refill: 1 - NMR, lipoprofile  8. Severe obesity (BMI >= 40) Discussed diet and exercise for person with BMI >25 Will recheck weight in 3-6 months   9. Hypothyroidism, unspecified hypothyroidism type - levothyroxine (SYNTHROID, LEVOTHROID) 88 MCG tablet; TAKE 1 TABLET (88 MCG TOTAL) BY MOUTH DAILY.  Dispense: 90 tablet; Refill: 1 - Thyroid Panel With TSH   Labs pending Health maintenance reviewed Diet and exercise encouraged Continue all meds Follow up  In 3 months   [place], FNP

## 2014-11-25 NOTE — Patient Instructions (Signed)
Stress and Stress Management Stress is a normal reaction to life events. It is what you feel when life demands more than you are used to or more than you can handle. Some stress can be useful. For example, the stress reaction can help you catch the last bus of the day, study for a test, or meet a deadline at work. But stress that occurs too often or for too long can cause problems. It can affect your emotional health and interfere with relationships and normal daily activities. Too much stress can weaken your immune system and increase your risk for physical illness. If you already have a medical problem, stress can make it worse. CAUSES  All sorts of life events may cause stress. An event that causes stress for one person may not be stressful for another person. Major life events commonly cause stress. These may be positive or negative. Examples include losing your job, moving into a new home, getting married, having a baby, or losing a loved one. Less obvious life events may also cause stress, especially if they occur day after day or in combination. Examples include working long hours, driving in traffic, caring for children, being in debt, or being in a difficult relationship. SIGNS AND SYMPTOMS Stress may cause emotional symptoms including, the following:  Anxiety. This is feeling worried, afraid, on edge, overwhelmed, or out of control.  Anger. This is feeling irritated or impatient.  Depression. This is feeling sad, down, helpless, or guilty.  Difficulty focusing, remembering, or making decisions. Stress may cause physical symptoms, including the following:   Aches and pains. These may affect your head, neck, back, stomach, or other areas of your body.  Tight muscles or clenched jaw.  Low energy or trouble sleeping. Stress may cause unhealthy behaviors, including the following:   Eating to feel better (overeating) or skipping meals.  Sleeping too little, too much, or both.  Working  too much or putting off tasks (procrastination).  Smoking, drinking alcohol, or using drugs to feel better. DIAGNOSIS  Stress is diagnosed through an assessment by your health care provider. Your health care provider will ask questions about your symptoms and any stressful life events.Your health care provider will also ask about your medical history and may order blood tests or other tests. Certain medical conditions and medicine can cause physical symptoms similar to stress. Mental illness can cause emotional symptoms and unhealthy behaviors similar to stress. Your health care provider may refer you to a mental health professional for further evaluation.  TREATMENT  Stress management is the recommended treatment for stress.The goals of stress management are reducing stressful life events and coping with stress in healthy ways.  Techniques for reducing stressful life events include the following:  Stress identification. Self-monitor for stress and identify what causes stress for you. These skills may help you to avoid some stressful events.  Time management. Set your priorities, keep a calendar of events, and learn to say "no." These tools can help you avoid making too many commitments. Techniques for coping with stress include the following:  Rethinking the problem. Try to think realistically about stressful events rather than ignoring them or overreacting. Try to find the positives in a stressful situation rather than focusing on the negatives.  Exercise. Physical exercise can release both physical and emotional tension. The key is to find a form of exercise you enjoy and do it regularly.  Relaxation techniques. These relax the body and mind. Examples include yoga, meditation, tai chi, biofeedback, deep  breathing, progressive muscle relaxation, listening to music, being out in nature, journaling, and other hobbies. Again, the key is to find one or more that you enjoy and can do  regularly.  Healthy lifestyle. Eat a balanced diet, get plenty of sleep, and do not smoke. Avoid using alcohol or drugs to relax.  Strong support network. Spend time with family, friends, or other people you enjoy being around.Express your feelings and talk things over with someone you trust. Counseling or talktherapy with a mental health professional may be helpful if you are having difficulty managing stress on your own. Medicine is typically not recommended for the treatment of stress.Talk to your health care provider if you think you need medicine for symptoms of stress. HOME CARE INSTRUCTIONS  Keep all follow-up visits as directed by your health care provider.  Take all medicines as directed by your health care provider. SEEK MEDICAL CARE IF:  Your symptoms get worse or you start having new symptoms.  You feel overwhelmed by your problems and can no longer manage them on your own. SEEK IMMEDIATE MEDICAL CARE IF:  You feel like hurting yourself or someone else. Document Released: 05/11/2001 Document Revised: 04/01/2014 Document Reviewed: 07/10/2013 [employer] Patient Information 2015 [employer]. This information is not intended to replace advice given to you by your health care provider. Make sure you discuss any questions you have with your health care provider.  

## 2014-11-26 ENCOUNTER — Telehealth: Payer: Self-pay

## 2014-11-26 LAB — CMP14+EGFR
ALBUMIN: 4.6 g/dL (ref 3.5–5.5)
ALK PHOS: 74 IU/L (ref 39–117)
ALT: 10 IU/L (ref 0–32)
AST: 14 IU/L (ref 0–40)
Albumin/Globulin Ratio: 1.8 (ref 1.1–2.5)
BUN / CREAT RATIO: 9 (ref 9–23)
BUN: 7 mg/dL (ref 6–24)
CO2: 25 mmol/L (ref 18–29)
CREATININE: 0.81 mg/dL (ref 0.57–1.00)
Calcium: 9.3 mg/dL (ref 8.7–10.2)
Chloride: 100 mmol/L (ref 97–108)
GFR, EST AFRICAN AMERICAN: 103 mL/min/{1.73_m2} (ref >59)
GFR, EST NON AFRICAN AMERICAN: 89 mL/min/{1.73_m2} (ref >59)
GLOBULIN, TOTAL: 2.5 g/dL (ref 1.5–4.5)
Glucose: 79 mg/dL (ref 65–99)
Potassium: 3.4 mmol/L — ABNORMAL LOW (ref 3.5–5.2)
Sodium: 140 mmol/L (ref 134–144)
Total Bilirubin: 0.8 mg/dL (ref 0.0–1.2)
Total Protein: 7.1 g/dL (ref 6.0–8.5)

## 2014-11-26 LAB — NMR, LIPOPROFILE
CHOLESTEROL: 108 mg/dL (ref 100–199)
HDL Cholesterol by NMR: 33 mg/dL — ABNORMAL LOW (ref >39)
HDL Particle Number: 28.7 umol/L — ABNORMAL LOW (ref >=30.5)
LDL Particle Number: 704 nmol/L (ref <1000)
LDL SIZE: 20.2 nm (ref >20.5)
LDL-C: 57 mg/dL (ref 0–99)
LP-IR Score: 71 — ABNORMAL HIGH (ref <=45)
Small LDL Particle Number: 474 nmol/L (ref <=527)
TRIGLYCERIDES BY NMR: 92 mg/dL (ref 0–149)

## 2014-11-26 LAB — THYROID PANEL WITH TSH
FREE THYROXINE INDEX: 3.8 (ref 1.2–4.9)
T3 Uptake Ratio: 30 % (ref 24–39)
T4 TOTAL: 12.6 ug/dL — AB (ref 4.5–12.0)
TSH: 1.13 u[IU]/mL (ref 0.450–4.500)

## 2014-11-26 NOTE — Telephone Encounter (Signed)
-----   Message from [facility] PcMary-Margaret Martin, FNP sent at 11/26/2014  2:46 PM EST ----- Kidney and liver function stable Cholesterol looks great Thyroid panel normal- t4 slightly elevated but no change to meds Continue current meds- low fat diet and exercise and recheck in 3 months

## 2014-11-26 NOTE — Telephone Encounter (Signed)
Pt aware of Lab results 

## 2015-01-13 ENCOUNTER — Ambulatory Visit: Payer: Medicaid Other

## 2015-01-17 ENCOUNTER — Ambulatory Visit (INDEPENDENT_AMBULATORY_CARE_PROVIDER_SITE_OTHER): Payer: Medicaid Other | Admitting: *Deleted

## 2015-01-17 DIAGNOSIS — Z3042 Encounter for surveillance of injectable contraceptive: Secondary | ICD-10-CM

## 2015-01-17 DIAGNOSIS — N3941 Urge incontinence: Secondary | ICD-10-CM

## 2015-01-17 MED ORDER — MEDROXYPROGESTERONE ACETATE 150 MG/ML IM SUSP
150.0000 mg | INTRAMUSCULAR | Status: AC
  Administered 2015-01-17 – 2015-12-31 (×4): 150 mg via INTRAMUSCULAR

## 2015-01-17 MED ORDER — MIRABEGRON ER 25 MG PO TB24: 25.0000 mg | ORAL_TABLET | Freq: Every day | ORAL | Status: DC

## 2015-01-17 NOTE — Patient Instructions (Signed)
Estradiol Cypionate; Medroxyprogesterone contraceptive injection What is this medicine? ESTRADIOL CYPIONATE; MEDROXYPROGESTERONE (es tra DYE ole sip EYE oh nate; me DROX ee proe JES te rone) is a birth-control method to prevent an unwanted pregnancy. Each injection provides birth control for 1 month (30 days). NOTE: This drug is discontinued in the [place]. This medicine may be used for other purposes; ask your health care provider or pharmacist if you have questions. COMMON BRAND NAME(S): Lunelle What should I tell my health care provider before I take this medicine? They need to know if you have or ever had any of these conditions: -abnormal vaginal bleeding -blood vessel disease or blood clots -breast, cervical, endometrial, ovarian, liver, or uterine cancer -diabetes -gallbladder disease -heart disease or recent heart attack -high blood pressure -high cholesterol -kidney disease -liver disease -migraine headaches -stroke -systemic lupus erythematosus (SLE) -tobacco smoker -an unusual or allergic reaction to estrogens, progestins, or other medicines, foods, dyes, or preservatives -pregnant or trying to get pregnant -breast-feeding How should I use this medicine? This medicine is for injection into a muscle. It is given by a health-care professional. The first injection is usually given during the first 5 days after the start of a menstrual period. This medicine will provide birth control for roughly 28 to 30 days. Talk to your pediatrician regarding the use of this medicine in children. Special care may be needed. This medicine has been used in female children who have started having menstrual periods. A patient package insert for the product will be given with each prescription and refill. Read this sheet carefully each time. The sheet may change frequently. Overdosage: If you think you have taken too much of this medicine contact a poison control center or emergency room at  once. NOTE: This medicine is only for you. Do not share this medicine with others. What if I miss a dose? Try not to miss a dose. You will need an injection once per month in order to maintain birth control. If you cannot keep an appointment call to reschedule. If it has been more than 4 weeks (33 days) since your last injection, you will need to have a pregnancy test before you can have another injection. What may interact with this medicine? -acetaminophen -antibiotics or medicines for infections, especially rifampin, rifabutin, rifapentine, and griseofulvin, and possibly penicillins or tetracyclines -aprepitant -ascorbic acid (vitamin C) -atorvastatin -barbiturate medicines, such as phenobarbital -bosentan -carbamazepine -caffeine -clofibrate -cyclosporine -dantrolene -doxercalciferol -felbamate -grapefruit juice -hydrocortisone -medicines for anxiety or sleeping problems, such as diazepam or temazepam -medicines for diabetes, including pioglitazone -mineral oil -modafinil -mycophenolate -nefazodone -oxcarbazepine -phenytoin -prednisolone -ritonavir or other medicines for HIV infection or AIDS -rosuvastatin -selegiline -soy isoflavones supplements -St. John's wort -tamoxifen or raloxifene -theophylline -thyroid hormones -topiramate -warfarin This list may not describe all possible interactions. Give your health care provider a list of all the medicines, herbs, non-prescription drugs, or dietary supplements you use. Also tell them if you smoke, drink alcohol, or use illegal drugs. Some items may interact with your medicine. What should I watch for while using this medicine? Visit your doctor or health care professional for regular checks on your progress. You will need a regular breast and pelvic exam and Pap smear while on this medicine. Smoking increases the risk of getting a blood clot or having a stroke while you are taking hormonal birth control, especially if you  are more than 44 years old. You are strongly advised not to smoke. Use of this product may cause   you to lose calcium from your bones. Loss of calcium may cause weak bones (osteoporosis). Only use this product for more than 2 years if other forms of birth control are not right for you. Ask your health care professional how you can keep strong bones. If you have received your injections on time, your chance of being pregnant is very low. If you think you may be pregnant, see your health care professional as soon as possible. Tell your health care professional if you want to get pregnant within the next year. Another form of birth control may be a better choice. If you are going to have elective surgery, you may need to stop taking this medicine before the surgery. Consult your health care professional for advice. This medicine does not protect you against HIV infection (AIDS) or any other sexually transmitted diseases. What side effects may I notice from receiving this medicine? Side effects that you should report to your doctor or health care professional as soon as possible: -allergic reactions like skin rash, itching or hives, swelling of the face, lips, or tongue -breast tissue changes or discharge -changes in vision -chest pain -confusion, trouble speaking or understanding -dark urine -general ill feeling or flu-like symptoms -light-colored stools -nausea, vomiting -pain, swelling, warmth in the leg -right upper belly pain -severe headaches -shortness of breath -sudden numbness or weakness of the face, arm or leg -trouble walking, dizziness, loss of balance or coordination -unusual vaginal bleeding -yellowing of the eyes or skin Side effects that usually do not require medical attention (report to your doctor or health care professional if they continue or are bothersome): -back pain -breast tenderness -depressed mood or mood swings -hair loss -increased hunger or thirst -increased  urination -fluid retention and swelling -stomach cramps or bloating -symptoms of vaginal infection like itching, irritation or unusual discharge -unusually weak or tired This list may not describe all possible side effects. Call your doctor for medical advice about side effects. You may report side effects to FDA at 1-800-FDA-1088. Where should I keep my medicine? This drug is given in a hospital or clinic and will not be stored at home. NOTE: This sheet is a summary. It may not cover all possible information. If you have questions about this medicine, talk to your doctor, pharmacist, or health care provider.  2015, Elsevier/Gold Standard. (2008-10-31 11:57:04)  

## 2015-01-17 NOTE — Progress Notes (Signed)
Pt given Depo-Provera 150mg  injection IM right glut, pt tolerated injection well.

## 2015-01-29 ENCOUNTER — Telehealth: Payer: Self-pay | Admitting: Nurse Practitioner

## 2015-01-29 NOTE — Telephone Encounter (Signed)
Pt aware.

## 2015-02-26 ENCOUNTER — Ambulatory Visit: Payer: Medicaid Other | Admitting: Nurse Practitioner

## 2015-03-05 ENCOUNTER — Telehealth: Payer: Self-pay | Admitting: Nurse Practitioner

## 2015-03-05 NOTE — Telephone Encounter (Signed)
Informed pt that we currently do not have samples of this medication. She does not need a prescription sent to the pharmacy, her insurance does not cover this medication

## 2015-03-17 ENCOUNTER — Ambulatory Visit (INDEPENDENT_AMBULATORY_CARE_PROVIDER_SITE_OTHER): Payer: Medicaid Other | Admitting: Nurse Practitioner

## 2015-03-17 ENCOUNTER — Encounter: Payer: Self-pay | Admitting: Nurse Practitioner

## 2015-03-17 VITALS — BP 132/82 | HR 104 | Temp 97.5°F | Ht 61.0 in | Wt 253.0 lb

## 2015-03-17 DIAGNOSIS — G5603 Carpal tunnel syndrome, bilateral upper limbs: Secondary | ICD-10-CM

## 2015-03-17 DIAGNOSIS — G5601 Carpal tunnel syndrome, right upper limb: Secondary | ICD-10-CM | POA: Diagnosis not present

## 2015-03-17 DIAGNOSIS — M79641 Pain in right hand: Secondary | ICD-10-CM

## 2015-03-17 DIAGNOSIS — E785 Hyperlipidemia, unspecified: Secondary | ICD-10-CM

## 2015-03-17 DIAGNOSIS — N3941 Urge incontinence: Secondary | ICD-10-CM | POA: Diagnosis not present

## 2015-03-17 DIAGNOSIS — I10 Essential (primary) hypertension: Secondary | ICD-10-CM | POA: Diagnosis not present

## 2015-03-17 DIAGNOSIS — G5602 Carpal tunnel syndrome, left upper limb: Secondary | ICD-10-CM

## 2015-03-17 DIAGNOSIS — G43711 Chronic migraine without aura, intractable, with status migrainosus: Secondary | ICD-10-CM | POA: Diagnosis not present

## 2015-03-17 DIAGNOSIS — B009 Herpesviral infection, unspecified: Secondary | ICD-10-CM | POA: Diagnosis not present

## 2015-03-17 DIAGNOSIS — F4001 Agoraphobia with panic disorder: Secondary | ICD-10-CM

## 2015-03-17 DIAGNOSIS — F3162 Bipolar disorder, current episode mixed, moderate: Secondary | ICD-10-CM | POA: Diagnosis not present

## 2015-03-17 DIAGNOSIS — M79642 Pain in left hand: Secondary | ICD-10-CM

## 2015-03-17 DIAGNOSIS — K219 Gastro-esophageal reflux disease without esophagitis: Secondary | ICD-10-CM | POA: Diagnosis not present

## 2015-03-17 DIAGNOSIS — E032 Hypothyroidism due to medicaments and other exogenous substances: Secondary | ICD-10-CM

## 2015-03-17 MED ORDER — LAMOTRIGINE 100 MG PO TABS: 200.0000 mg | ORAL_TABLET | Freq: Every day | ORAL | Status: DC

## 2015-03-17 NOTE — Patient Instructions (Addendum)
[facility]- Therapist [address] [place] ,[place] [address] [phone] Children limited to anxiety and depression- NO ADD/ADHD Does not accept Medicaid  [facility] [address] [place], [place] [phone] Does see children Does accept medicaid Will assess for Autism but not treat  [facility] [address] [place] [phone] Does see children  Does accept Medicaid Medication management- substance abuse- bipolar- grief- family-marriage- OCD- Anxiety- PTSD  [facility] [address] [place]  [phone] Does see children Does accept medicaid They do perform psychological testing  [facility] [address] [place] Schedule through [employer] [phone] Patient must call and make own appointment Does se children Does accept Medicaid  [facility] [address] [place], [place] [phone] Sees Children 7-10 accompanied by an adult, 11 and up by themselves Does accept Medicaid Will see patients with- substance abuse-ADHD-ADD-Bipolar-Domestic violence-Marriage counseling- Family Counseling and sexual abuse  [facility]- Psychologist and Psychiatrist [address] [place] [phone] Does see children Does accept Medicaid  [facility] [address] [place] [phone]  Dr. Lugo-  Psychiatrist [address] [place], [place] [phone] Specializes in ADHD and addictions They do ADHD testing Suboxone clinic  [facility] [address] [place] [phone] Does Child psychological testing  [facility] [address] [place] [phone] Does Accept Medicaid Evaluates for Autism  [facility] [address] [place] [phone] Does Not accept Medicaid Does do adult ADD  evaluations  Dr. Akinlayo [address] [place] [phone] Does not Take Medicaid Sees ADD and ADHD for treatment      [facility] [address] [place], [place] [address] [phone] Takes Medicaid WIll see children as young as 3         

## 2015-03-17 NOTE — Progress Notes (Signed)
Subjective:    Patient ID: Dawn Craig, female    DOB: March 19, 1971, 44 y.o.   MRN: [id number]  Patient is here today for chronic visit follow up. No acute complaint today.  *pt reports she's having a lot of mania episode, yelling and feeling it has worsen in the last two months.  Left hand numbness and tingling and pain in the right with radiate to her elbow. She reports her hands goes to sleep at night.   Hypertension This is a chronic problem. The current episode started more than 1 year ago. The problem is unchanged. The problem is controlled. Pertinent negatives include no chest pain or palpitations. Risk factors for coronary artery disease include dyslipidemia, obesity and sedentary lifestyle. Past treatments include diuretics and angiotensin blockers. The current treatment provides mild improvement. Compliance problems include diet and exercise.  Hypertensive end-organ damage includes a thyroid problem.  Hyperlipidemia This is a chronic problem. The current episode started more than 1 year ago. The problem is uncontrolled. Recent lipid tests were reviewed and are variable. Pertinent negatives include no chest pain or myalgias. Current antihyperlipidemic treatment includes statins. Compliance problems include adherence to diet and adherence to exercise.  Risk factors for coronary artery disease include dyslipidemia, hypertension and obesity.  Thyroid Problem Presents for follow-up (hypothyroidism) visit. Patient reports no constipation, diaphoresis, diarrhea, heat intolerance, menstrual problem, palpitations or visual change. The symptoms have been stable. Her past medical history is significant for hyperlipidemia.  Migraines Chronic problem. Says she has some type of headache everyday. Her last migraine was 4  days ago imitrex not helping anymore. Patient has not been to a headache clinic. She has recently been seeing chiropractor which seems to help with the migraines.  Bipolar Chronic  problem. Patient is on Lamictal. Takes Klonopin when she goes out because she has panic attacks when she is around a lot of people. She does not have a psychiatrist here in [place].  Bladder leakage  Started back in November. Happens daily. Has to wear panty liners daily. Patient says she doesn't feel it sometimes. Has trouble completely emptying bladder. Does C/O urgency. She reports  Fever blisters Occur weekly lasting 6 days. Can have more than one at the same time. GERD  Patient use to be on zantac PRN.     Review of Systems  Constitutional: Negative for diaphoresis.  Respiratory: Negative.   Cardiovascular: Negative for chest pain and palpitations.  Gastrointestinal: Negative for diarrhea and constipation.  Endocrine: Negative for heat intolerance.  Genitourinary: Positive for urgency. Negative for dysuria, hematuria, flank pain, menstrual problem and pelvic pain.  Musculoskeletal: Negative for myalgias.       Objective:   Physical Exam  Constitutional: She is oriented to person, place, and time. She appears well-developed and well-nourished.  HENT:  Nose: Nose normal.  Mouth/Throat: Oropharynx is clear and moist.  Eyes: EOM are normal.  Neck: Trachea normal, normal range of motion and full passive range of motion without pain. Neck supple. No JVD present. Carotid bruit is not present. No thyromegaly present.  Cardiovascular: Normal rate, regular rhythm, normal heart sounds and intact distal pulses.  Exam reveals no gallop and no friction rub.   No murmur heard. Pulmonary/Chest: Effort normal and breath sounds normal.  Abdominal: Soft. Bowel sounds are normal. She exhibits no distension and no mass. There is no tenderness.  Musculoskeletal: Normal range of motion.  Lymphadenopathy:    She has no cervical adenopathy.  Neurological: She is alert and oriented to person, place,  and time. She has normal reflexes.  Skin: Skin is warm and dry.  Psychiatric: She has a normal mood and  affect. Her behavior is normal. Judgment and thought content normal.    BP 142/90 mmHg  Pulse 104  Temp(Src) 97.5 F (36.4 C) (Oral)  Ht '5\' 1"'  (1.549 m)  Wt 253 lb (114.76 kg)  BMI 47.83 kg/m2      Assessment & Plan:   1. Hyperlipidemia with target LDL less than 100 Low fat diet - NMR, lipoprofile  2. Agoraphobia with panic attacks  stress management  3. Urge incontinence of urine keegle exercise  Avoid drinking right before bed  4. HSV-1 (herpes simplex virus 1) infection Stress management  5. Severe obesity (BMI >= 40) Exercise encouraged  6. Essential hypertension, benign Low salt diet - CMP14+EGFR  7. Bipolar 1 disorder, mixed, moderate lamictal increase from 132m to 2064mpo daily.  - Ambulatory referral to Psychiatry - lamoTRIgine (LAMICTAL) 100 MG tablet; Take 2 tablets (200 mg total) by mouth daily.  Dispense: 60 tablet; Refill: 5  8. Intractable chronic migraine without aura and with status migrainosus Avoid triggers   9. 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 Gastroesophageal reflux disease, esophagitis presence not specified Avoid spicy foods Do not eat 2 hours prior to bedtime   11. Bilateral carpal tunnel syndrome Ortho referral done   12. Bilateral hand pain - Ambulatory referral to Orthopedic Surgery    Labs pending Health maintenance reviewed Diet and exercise encouraged Continue all meds Follow up  In 3 months   MaPineyFNP

## 2015-03-18 LAB — CMP14+EGFR
ALT: 12 IU/L (ref 0–32)
AST: 17 IU/L (ref 0–40)
Albumin/Globulin Ratio: 2.1 (ref 1.1–2.5)
Albumin: 4.8 g/dL (ref 3.5–5.5)
Alkaline Phosphatase: 67 IU/L (ref 39–117)
BUN/Creatinine Ratio: 11 (ref 9–23)
BUN: 7 mg/dL (ref 6–24)
Bilirubin Total: 0.9 mg/dL (ref 0.0–1.2)
CALCIUM: 9.7 mg/dL (ref 8.7–10.2)
CO2: 26 mmol/L (ref 18–29)
Chloride: 97 mmol/L (ref 97–108)
Creatinine, Ser: 0.66 mg/dL (ref 0.57–1.00)
GFR calc Af Amer: 125 mL/min/{1.73_m2} (ref >59)
GFR calc non Af Amer: 109 mL/min/{1.73_m2} (ref >59)
Globulin, Total: 2.3 g/dL (ref 1.5–4.5)
Glucose: 94 mg/dL (ref 65–99)
POTASSIUM: 3.6 mmol/L (ref 3.5–5.2)
SODIUM: 141 mmol/L (ref 134–144)
Total Protein: 7.1 g/dL (ref 6.0–8.5)

## 2015-03-18 LAB — NMR, LIPOPROFILE
Cholesterol: 122 mg/dL (ref 100–199)
HDL Cholesterol by NMR: 37 mg/dL — ABNORMAL LOW (ref >39)
HDL Particle Number: 29 umol/L — ABNORMAL LOW (ref >=30.5)
LDL PARTICLE NUMBER: 781 nmol/L (ref <1000)
LDL SIZE: 20.4 nm (ref >20.5)
LDL-C: 56 mg/dL (ref 0–99)
LP-IR SCORE: 76 — AB (ref <=45)
SMALL LDL PARTICLE NUMBER: 387 nmol/L (ref <=527)
TRIGLYCERIDES BY NMR: 144 mg/dL (ref 0–149)

## 2015-04-11 ENCOUNTER — Ambulatory Visit (INDEPENDENT_AMBULATORY_CARE_PROVIDER_SITE_OTHER): Payer: Medicaid Other | Admitting: *Deleted

## 2015-04-11 DIAGNOSIS — Z3042 Encounter for surveillance of injectable contraceptive: Secondary | ICD-10-CM | POA: Diagnosis not present

## 2015-04-11 NOTE — Patient Instructions (Signed)

## 2015-04-11 NOTE — Progress Notes (Signed)
Pt given Depo-Provera 150mg /261ml injection IM LUOQ and tolerated well.

## 2015-04-29 ENCOUNTER — Other Ambulatory Visit: Payer: Self-pay | Admitting: Nurse Practitioner

## 2015-05-29 ENCOUNTER — Other Ambulatory Visit: Payer: Self-pay | Admitting: Nurse Practitioner

## 2015-05-29 NOTE — Telephone Encounter (Signed)
Refill called to pharmacy.

## 2015-05-29 NOTE — Telephone Encounter (Signed)
Please call in klonopin with 1 refills 

## 2015-05-29 NOTE — Telephone Encounter (Signed)
Last sen 03/17/15 MMM If approved route to [facility]

## 2015-06-05 ENCOUNTER — Other Ambulatory Visit: Payer: Self-pay | Admitting: Nurse Practitioner

## 2015-06-10 ENCOUNTER — Other Ambulatory Visit: Payer: Self-pay | Admitting: Nurse Practitioner

## 2015-06-16 ENCOUNTER — Ambulatory Visit: Payer: Medicaid Other | Admitting: Nurse Practitioner

## 2015-06-19 ENCOUNTER — Ambulatory Visit: Payer: Medicaid Other | Admitting: Nurse Practitioner

## 2015-06-25 ENCOUNTER — Ambulatory Visit (INDEPENDENT_AMBULATORY_CARE_PROVIDER_SITE_OTHER): Payer: Medicaid Other | Admitting: Nurse Practitioner

## 2015-06-25 ENCOUNTER — Encounter: Payer: Self-pay | Admitting: Nurse Practitioner

## 2015-06-25 VITALS — BP 140/80 | HR 99 | Temp 98.0°F | Ht 61.0 in | Wt 260.0 lb

## 2015-06-25 DIAGNOSIS — B009 Herpesviral infection, unspecified: Secondary | ICD-10-CM

## 2015-06-25 DIAGNOSIS — N3941 Urge incontinence: Secondary | ICD-10-CM | POA: Diagnosis not present

## 2015-06-25 DIAGNOSIS — G43711 Chronic migraine without aura, intractable, with status migrainosus: Secondary | ICD-10-CM | POA: Diagnosis not present

## 2015-06-25 DIAGNOSIS — F508 Other eating disorders: Secondary | ICD-10-CM

## 2015-06-25 DIAGNOSIS — I10 Essential (primary) hypertension: Secondary | ICD-10-CM

## 2015-06-25 DIAGNOSIS — F3162 Bipolar disorder, current episode mixed, moderate: Secondary | ICD-10-CM

## 2015-06-25 DIAGNOSIS — J302 Other seasonal allergic rhinitis: Secondary | ICD-10-CM | POA: Diagnosis not present

## 2015-06-25 DIAGNOSIS — K219 Gastro-esophageal reflux disease without esophagitis: Secondary | ICD-10-CM

## 2015-06-25 DIAGNOSIS — E032 Hypothyroidism due to medicaments and other exogenous substances: Secondary | ICD-10-CM | POA: Diagnosis not present

## 2015-06-25 DIAGNOSIS — F5081 Binge eating disorder: Secondary | ICD-10-CM

## 2015-06-25 DIAGNOSIS — E785 Hyperlipidemia, unspecified: Secondary | ICD-10-CM

## 2015-06-25 DIAGNOSIS — F4001 Agoraphobia with panic disorder: Secondary | ICD-10-CM

## 2015-06-25 MED ORDER — LORATADINE 10 MG PO TABS: 10.0000 mg | ORAL_TABLET | Freq: Every day | ORAL | Status: DC

## 2015-06-25 MED ORDER — MIRABEGRON ER 25 MG PO TB24: 25.0000 mg | ORAL_TABLET | Freq: Every day | ORAL | Status: DC

## 2015-06-25 MED ORDER — MIRABEGRON ER 25 MG PO TB24
25.0000 mg | ORAL_TABLET | Freq: Every day | ORAL | Status: DC
Start: 2015-06-25 — End: 2015-06-25

## 2015-06-25 MED ORDER — LOSARTAN POTASSIUM-HCTZ 100-25 MG PO TABS: 1.0000 | ORAL_TABLET | Freq: Every day | ORAL | Status: DC

## 2015-06-25 NOTE — Patient Instructions (Signed)
Exercise to Stay Healthy Exercise helps you become and stay healthy. EXERCISE IDEAS AND TIPS Choose exercises that:  You enjoy.  Fit into your day. You do not need to exercise really hard to be healthy. You can do exercises at a slow or medium level and stay healthy. You can:  Stretch before and after working out.  Try yoga, Pilates, or tai chi.  Lift weights.  Walk fast, swim, jog, run, climb stairs, bicycle, dance, or rollerskate.  Take aerobic classes. Exercises that burn about 150 calories:  Running 1  miles in 15 minutes.  Playing volleyball for 45 to 60 minutes.  Washing and waxing a car for 45 to 60 minutes.  Playing touch football for 45 minutes.  Walking 1  miles in 35 minutes.  Pushing a stroller 1  miles in 30 minutes.  Playing basketball for 30 minutes.  Raking leaves for 30 minutes.  Bicycling 5 miles in 30 minutes.  Walking 2 miles in 30 minutes.  Dancing for 30 minutes.  Shoveling snow for 15 minutes.  Swimming laps for 20 minutes.  Walking up stairs for 15 minutes.  Bicycling 4 miles in 15 minutes.  Gardening for 30 to 45 minutes.  Jumping rope for 15 minutes.  Washing windows or floors for 45 to 60 minutes. Document Released: 12/18/2010 Document Revised: 02/07/2012 Document Reviewed: 12/18/2010 ExitCare Patient Information 2015 [employer]. This information is not intended to replace advice given to you by your health care provider. Make sure you discuss any questions you have with your health care provider.  

## 2015-06-25 NOTE — Progress Notes (Signed)
Subjective:    Patient ID: Dawn Craig, female    DOB: [birth year], 44 y.o.   MRN: [id number]  Patient is here today for chronic visit follow up. No acute complaint today.   Hypertension This is a chronic problem. The current episode started more than 1 year ago. The problem is unchanged. The problem is controlled. Pertinent negatives include no chest pain or palpitations. Risk factors for coronary artery disease include dyslipidemia, obesity and sedentary lifestyle. Past treatments include diuretics and angiotensin blockers. The current treatment provides mild improvement. Compliance problems include diet and exercise.  Hypertensive end-organ damage includes a thyroid problem.  Hyperlipidemia This is a chronic problem. The current episode started more than 1 year ago. The problem is uncontrolled. Recent lipid tests were reviewed and are variable. Pertinent negatives include no chest pain or myalgias. Current antihyperlipidemic treatment includes statins. Compliance problems include adherence to diet and adherence to exercise.  Risk factors for coronary artery disease include dyslipidemia, hypertension and obesity.  Thyroid Problem Presents for follow-up (hypothyroidism) visit. Patient reports no constipation, diaphoresis, diarrhea, heat intolerance, menstrual problem, palpitations or visual change. The symptoms have been stable. Her past medical history is significant for hyperlipidemia.  Migraines Chronic problem. Says she has some type of headache everyday. Her last migraine was 4  days ago imitrex not helping anymore. Patient has not been to a headache clinic. She has recently been seeing chiropractor which seems to help with the migraines.  Bipolar/ agrophobia Chronic problem. Patient is on Lamictal. Takes Klonopin when she goes out because she has panic attacks when she is around a lot of people. She does not have a psychiatrist here in [place]. Her last episode was very manic. Bladder leakage   Started back in November. Happens daily. Has to wear panty liners daily. Patient says she doesn't feel it sometimes. Has trouble completely emptying bladder. Does C/O urgency. She reports  Fever blisters Occur weekly lasting 6 days. Can have more than one at the same time. GERD  Patient use to be on zantac PRN.     Review of Systems  Constitutional: Negative for diaphoresis.  Respiratory: Negative.   Cardiovascular: Negative for chest pain and palpitations.  Gastrointestinal: Negative for diarrhea and constipation.  Endocrine: Negative for heat intolerance.  Genitourinary: Positive for urgency. Negative for dysuria, hematuria, flank pain, menstrual problem and pelvic pain.  Musculoskeletal: Negative for myalgias.       Objective:   Physical Exam  Constitutional: She is oriented to person, place, and time. She appears well-developed and well-nourished.  HENT:  Nose: Nose normal.  Mouth/Throat: Oropharynx is clear and moist.  Eyes: EOM are normal.  Neck: Trachea normal, normal range of motion and full passive range of motion without pain. Neck supple. No JVD present. Carotid bruit is not present. No thyromegaly present.  Cardiovascular: Normal rate, regular rhythm, normal heart sounds and intact distal pulses.  Exam reveals no gallop and no friction rub.   No murmur heard. Pulmonary/Chest: Effort normal and breath sounds normal.  Abdominal: Soft. Bowel sounds are normal. She exhibits no distension and no mass. There is no tenderness.  Musculoskeletal: Normal range of motion.  Lymphadenopathy:    She has no cervical adenopathy.  Neurological: She is alert and oriented to person, place, and time. She has normal reflexes.  Skin: Skin is warm and dry.  Psychiatric: She has a normal mood and affect. Her behavior is normal. Judgment and thought content normal.    BP 140/80 mmHg  Pulse 99  Temp(Src) 98 F (36.7 C) (Oral)  Ht '5\' 1"'  (1.549 m)  Wt 260 lb (117.935 kg)  BMI 49.15  kg/m2       Assessment & Plan:  1. 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 Essential hypertension, benign Do not add salt in diet - losartan-hydrochlorothiazide (HYZAAR) 100-25 MG per tablet; Take 1 tablet by mouth daily.  Dispense: 90 tablet; Refill: 1 - CMP14+EGFR  3. Gastroesophageal reflux disease, esophagitis presence not specified Avoid spicy foods Do not eat 2 hours prior to bedtime   4. 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 Severe obesity (BMI >= 40) Discussed diet and exercise for person with BMI >25 Will recheck weight in 3-6 months   6. Hyperlipidemia with target LDL less than 100 Low fat diet - Lipid panel  7. Bipolar 1 disorder, mixed, moderate Continue lamictal s rx  8. Binge eating disorder Discussed issues- encouraged weight watchers  9. Agoraphobia with panic attacks  10. HSV-1 (herpes simplex virus 1) infection  11. Urge incontinence of urine - mirabegron ER (MYRBETRIQ) 25 MG TB24 tablet; Take 1 tablet (25 mg total) by mouth daily.  Dispense: 14 tablet; Refill: 0  12. Other seasonal allergic rhinitis - loratadine (CLARITIN) 10 MG tablet; Take 1 tablet (10 mg total) by mouth daily.  Dispense: 30 tablet; Refill: 11    Labs pending Health maintenance reviewed Diet and exercise encouraged Continue all meds Follow up  In 3 months   [place], FNP

## 2015-06-25 NOTE — Addendum Note (Signed)
Addended by: Bennie Pierini on: 06/25/2015 03:08 PM   Modules accepted: Orders

## 2015-06-26 LAB — CMP14+EGFR
A/G RATIO: 2.2 (ref 1.1–2.5)
ALK PHOS: 70 IU/L (ref 39–117)
ALT: 12 IU/L (ref 0–32)
AST: 11 IU/L (ref 0–40)
Albumin: 4.6 g/dL (ref 3.5–5.5)
BUN/Creatinine Ratio: 14 (ref 9–23)
BUN: 10 mg/dL (ref 6–24)
Bilirubin Total: 0.6 mg/dL (ref 0.0–1.2)
CALCIUM: 10.1 mg/dL (ref 8.7–10.2)
CHLORIDE: 99 mmol/L (ref 97–108)
CO2: 24 mmol/L (ref 18–29)
Creatinine, Ser: 0.69 mg/dL (ref 0.57–1.00)
GFR calc non Af Amer: 107 mL/min/{1.73_m2} (ref >59)
GFR, EST AFRICAN AMERICAN: 123 mL/min/{1.73_m2} (ref >59)
Globulin, Total: 2.1 g/dL (ref 1.5–4.5)
Glucose: 92 mg/dL (ref 65–99)
POTASSIUM: 4.2 mmol/L (ref 3.5–5.2)
Sodium: 140 mmol/L (ref 134–144)
TOTAL PROTEIN: 6.7 g/dL (ref 6.0–8.5)

## 2015-06-26 LAB — LIPID PANEL
Chol/HDL Ratio: 3.7 ratio units (ref 0.0–4.4)
Cholesterol, Total: 131 mg/dL (ref 100–199)
HDL: 35 mg/dL — AB (ref >39)
LDL CALC: 60 mg/dL (ref 0–99)
TRIGLYCERIDES: 180 mg/dL — AB (ref 0–149)
VLDL Cholesterol Cal: 36 mg/dL (ref 5–40)

## 2015-06-26 LAB — THYROID PANEL WITH TSH
Free Thyroxine Index: 2.3 (ref 1.2–4.9)
T3 Uptake Ratio: 25 % (ref 24–39)
T4, Total: 9.2 ug/dL (ref 4.5–12.0)
TSH: 1.27 u[IU]/mL (ref 0.450–4.500)

## 2015-07-01 ENCOUNTER — Telehealth: Payer: Self-pay

## 2015-07-01 MED ORDER — FESOTERODINE FUMARATE ER 8 MG PO TB24: 8.0000 mg | ORAL_TABLET | Freq: Every day | ORAL | Status: DC

## 2015-07-01 NOTE — Telephone Encounter (Signed)
Myrbetriq non preferred Medicaid    Preferred drugs are : Oxybutynin syrup, Toviaz tablet, Vesicare

## 2015-07-01 NOTE — Telephone Encounter (Signed)
Had to change myrbetriq to [place] because insurance would not cover [employer]

## 2015-07-07 ENCOUNTER — Ambulatory Visit: Payer: Medicaid Other

## 2015-07-09 ENCOUNTER — Ambulatory Visit (INDEPENDENT_AMBULATORY_CARE_PROVIDER_SITE_OTHER): Payer: Medicaid Other | Admitting: *Deleted

## 2015-07-09 DIAGNOSIS — Z3042 Encounter for surveillance of injectable contraceptive: Secondary | ICD-10-CM | POA: Diagnosis not present

## 2015-07-31 ENCOUNTER — Telehealth: Payer: Self-pay

## 2015-07-31 ENCOUNTER — Telehealth: Payer: Self-pay | Admitting: Nurse Practitioner

## 2015-07-31 NOTE — Telephone Encounter (Signed)
Patient has been taking myrbetriq for a while. Please start prior authorization. Samples given for 2 weeks. Patient notified

## 2015-07-31 NOTE — Telephone Encounter (Signed)
Non preferred for Medicaid is Myrbetriq  Preferred meds are: Oxybutynin syrup, tablet, Toviaz, Vesicare

## 2015-07-31 NOTE — Telephone Encounter (Signed)
Ok [facility] will let [facility] know  They sent over a prior authorization for Myrbetriq

## 2015-07-31 NOTE — Telephone Encounter (Signed)
Patient is on toviaz- i do not know what this is about

## 2015-08-21 ENCOUNTER — Telehealth: Payer: Self-pay

## 2015-08-21 MED ORDER — FESOTERODINE FUMARATE ER 8 MG PO TB24: 8.0000 mg | ORAL_TABLET | Freq: Every day | ORAL | Status: DC

## 2015-08-21 NOTE — Telephone Encounter (Signed)
Medicaid denied again Mybetriq stating patient needs to try Gala Murdoch

## 2015-08-21 NOTE — Telephone Encounter (Signed)
patient was rx Gala Murdoch last month

## 2015-08-21 NOTE — Telephone Encounter (Signed)
Patient says Dawn Craig not working wants [employer]- can you try to get authorized

## 2015-08-25 ENCOUNTER — Other Ambulatory Visit: Payer: Self-pay | Admitting: Nurse Practitioner

## 2015-08-26 NOTE — Telephone Encounter (Signed)
MMM pt, last filled 05/29/15, last seen 06/25/15. [facility]

## 2015-08-26 NOTE — Telephone Encounter (Signed)
Please review and advise.

## 2015-08-27 NOTE — Telephone Encounter (Signed)
[facility] for [facility] Okayed per Dr Darlyn Read

## 2015-09-07 ENCOUNTER — Other Ambulatory Visit: Payer: Self-pay | Admitting: Nurse Practitioner

## 2015-09-07 ENCOUNTER — Other Ambulatory Visit: Payer: Self-pay | Admitting: Family Medicine

## 2015-09-25 ENCOUNTER — Ambulatory Visit (INDEPENDENT_AMBULATORY_CARE_PROVIDER_SITE_OTHER): Payer: Medicaid Other | Admitting: Nurse Practitioner

## 2015-09-25 ENCOUNTER — Encounter: Payer: Self-pay | Admitting: Nurse Practitioner

## 2015-09-25 VITALS — BP 138/94 | HR 120 | Temp 97.3°F | Ht 61.0 in | Wt 267.0 lb

## 2015-09-25 DIAGNOSIS — F3162 Bipolar disorder, current episode mixed, moderate: Secondary | ICD-10-CM | POA: Diagnosis not present

## 2015-09-25 DIAGNOSIS — J069 Acute upper respiratory infection, unspecified: Secondary | ICD-10-CM | POA: Diagnosis not present

## 2015-09-25 DIAGNOSIS — E032 Hypothyroidism due to medicaments and other exogenous substances: Secondary | ICD-10-CM

## 2015-09-25 DIAGNOSIS — I10 Essential (primary) hypertension: Secondary | ICD-10-CM | POA: Diagnosis not present

## 2015-09-25 DIAGNOSIS — G43711 Chronic migraine without aura, intractable, with status migrainosus: Secondary | ICD-10-CM | POA: Diagnosis not present

## 2015-09-25 DIAGNOSIS — K219 Gastro-esophageal reflux disease without esophagitis: Secondary | ICD-10-CM | POA: Diagnosis not present

## 2015-09-25 DIAGNOSIS — E785 Hyperlipidemia, unspecified: Secondary | ICD-10-CM

## 2015-09-25 DIAGNOSIS — B009 Herpesviral infection, unspecified: Secondary | ICD-10-CM | POA: Diagnosis not present

## 2015-09-25 MED ORDER — PRAVASTATIN SODIUM 40 MG PO TABS: ORAL_TABLET | ORAL | Status: DC

## 2015-09-25 MED ORDER — LAMOTRIGINE 100 MG PO TABS: 100.0000 mg | ORAL_TABLET | Freq: Every day | ORAL | Status: DC

## 2015-09-25 MED ORDER — RANITIDINE HCL 150 MG PO TABS: 150.0000 mg | ORAL_TABLET | Freq: Two times a day (BID) | ORAL | Status: DC

## 2015-09-25 MED ORDER — LEVOTHYROXINE SODIUM 88 MCG PO TABS: ORAL_TABLET | ORAL | Status: DC

## 2015-09-25 MED ORDER — HYDROCODONE-HOMATROPINE 5-1.5 MG/5ML PO SYRP: 5.0000 mL | ORAL_SOLUTION | Freq: Four times a day (QID) | ORAL | Status: DC | PRN

## 2015-09-25 MED ORDER — PREDNISONE 20 MG PO TABS: ORAL_TABLET | ORAL | Status: DC

## 2015-09-25 MED ORDER — DOXYCYCLINE HYCLATE 100 MG PO TABS
100.0000 mg | ORAL_TABLET | Freq: Two times a day (BID) | ORAL | Status: DC
Start: 2015-09-25 — End: 2016-01-05

## 2015-09-25 MED ORDER — CLONAZEPAM 0.5 MG PO TABS: 0.5000 mg | ORAL_TABLET | Freq: Two times a day (BID) | ORAL | Status: DC

## 2015-09-25 NOTE — Progress Notes (Signed)
Subjective:    Patient ID: Dawn Craig, female    DOB: [birth year], 44 y.o.   MRN: [id number]  Patient is here today for chronic visit follow up. She was seen in July and had elevated blood pressure. No changes were made at that time.   * c/o cough that started over a  Month ago- Has had z pak and steroids and inhaler- 2 weeks ago- and is no better  Hypertension This is a chronic problem. The current episode started more than 1 year ago. The problem is unchanged. The problem is controlled. Pertinent negatives include no chest pain or palpitations. Risk factors for coronary artery disease include dyslipidemia, obesity and sedentary lifestyle. Past treatments include diuretics and angiotensin blockers. The current treatment provides mild improvement. Compliance problems include diet and exercise.  Hypertensive end-organ damage includes a thyroid problem.  Hyperlipidemia This is a chronic problem. The current episode started more than 1 year ago. The problem is uncontrolled. Recent lipid tests were reviewed and are variable. Pertinent negatives include no chest pain or myalgias. Current antihyperlipidemic treatment includes statins. Compliance problems include adherence to diet and adherence to exercise.  Risk factors for coronary artery disease include dyslipidemia, hypertension and obesity.  Thyroid Problem Presents for follow-up (hypothyroidism) visit. Patient reports no constipation, diaphoresis, diarrhea, heat intolerance, menstrual problem, palpitations or visual change. The symptoms have been stable. Her past medical history is significant for hyperlipidemia.  Migraines Chronic problem. Says she has some type of headache everyday. Her last migraine was 4  days ago imitrex not helping anymore. Patient has not been to a headache clinic. She has recently been seeing chiropractor which seems to help with the migraines.  Bipolar/ agrophobia Chronic problem. Patient is on Lamictal. Takes Klonopin  when she goes out because she has panic attacks when she is around a lot of people. She does not have a psychiatrist here in [place]. Her last episode was very manic. Bladder leakage  Started back in November. Happens daily. Has to wear panty liners daily. Patient says she doesn't feel it sometimes. Has trouble completely emptying bladder. Does C/O urgency. She reports  Fever blisters Occur weekly lasting 6 days. Can have more than one at the same time. GERD  Patient use to be on zantac PRN.     Review of Systems  Constitutional: Negative for diaphoresis.  HENT: Positive for congestion and sinus pressure. Negative for ear pain and sore throat.   Respiratory: Positive for cough.   Cardiovascular: Negative for chest pain and palpitations.  Gastrointestinal: Negative for diarrhea and constipation.  Endocrine: Negative for heat intolerance.  Genitourinary: Positive for urgency. Negative for dysuria, hematuria, flank pain, menstrual problem and pelvic pain.  Musculoskeletal: Negative for myalgias.  All other systems reviewed and are negative.      Objective:   Physical Exam  Constitutional: She is oriented to person, place, and time. She appears well-developed and well-nourished.  HENT:  Nose: Nose normal.  Mouth/Throat: Oropharynx is clear and moist.  Eyes: EOM are normal.  Neck: Trachea normal, normal range of motion and full passive range of motion without pain. Neck supple. No JVD present. Carotid bruit is not present. No thyromegaly present.  Cardiovascular: Normal rate, regular rhythm, normal heart sounds and intact distal pulses.  Exam reveals no gallop and no friction rub.   No murmur heard. Pulmonary/Chest: Effort normal. She has wheezes (exp failnt in lower lobes).  Deep dry cough  Abdominal: Soft. Bowel sounds are normal. She exhibits no distension and  no mass. There is no tenderness.  Musculoskeletal: Normal range of motion.  Lymphadenopathy:    She has no cervical adenopathy.   Neurological: She is alert and oriented to person, place, and time. She has normal reflexes.  Skin: Skin is warm and dry.  Psychiatric: She has a normal mood and affect. Her behavior is normal. Judgment and thought content normal.    BP 138/94 mmHg  Pulse 120  Temp(Src) 97.3 F (36.3 C) (Oral)  Ht '5\' 1"'  (1.549 m)  Wt 267 lb (121.11 kg)  BMI 50.48 kg/m2        Assessment & Plan:  1. Essential hypertension, benign Do nnot add salt to diet Keep diary of blood pressure- stop in in 1-2 weeks to recheck blood pressure. - CMP14+EGFR  2. Intractable chronic migraine without aura and with status migrainosus  3. Gastroesophageal reflux disease, esophagitis presence not specified Avoid spicy foods Do not eat 2 hours prior to bedtime - ranitidine (ZANTAC) 150 MG tablet; Take 1 tablet (150 mg total) by mouth 2 (two) times daily.  Dispense: 90 tablet; Refill: 1  4. 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  Dispense: 90 tablet; Refill: 1  5. Hyperlipidemia with target LDL less than 100 - pravastatin (PRAVACHOL) 40 MG tablet; TAKE 1 TABLET (40 MG TOTAL) BY MOUTH DAILY.  Dispense: 90 tablet; Refill: 1 - Lipid panel  6. HSV-1 (herpes simplex virus 1) infection  7. Bipolar 1 disorder, mixed, moderate (HCC) Stress management - lamoTRIgine (LAMICTAL) 100 MG tablet; Take 1 tablet (100 mg total) by mouth daily.  Dispense: 60 tablet; Refill: 5 - clonazePAM (KLONOPIN) 0.5 MG tablet; Take 1 tablet (0.5 mg total) by mouth 2 (two) times daily.  Dispense: 60 tablet; Refill: 1  8. Morbid obesity due to excess calories ([place]) Discussed diet and exercise for person with BMI >25 Will recheck weight in 3-6 months  9. Upper respiratory infection with cough and congestion 1. Take meds as prescribed 2. Use a cool mist humidifier especially during the winter months and when heat has been  humid. 3. Use saline nose sprays frequently 4. Saline irrigations of the nose can be very helpful if done frequently.  * 4X daily for 1 week*  * Use of a nettie pot can be helpful with this. Follow directions with this* 5. Drink plenty of fluids 6. Keep thermostat turn down low 7.For any cough or congestion  Use plain Mucinex- regular strength or max strength is fine- avoid decongestant   * Children- consult with Pharmacist for dosing 8. For fever or aces or pains- take tylenol or ibuprofen appropriate for age and weight.  * for fevers greater than 101 orally you may alternate ibuprofen and tylenol every  3 hours.    - doxycycline (VIBRA-TABS) 100 MG tablet; Take 1 tablet (100 mg total) by mouth 2 (two) times daily. 1 po bid  Dispense: 20 tablet; Refill: 0 - predniSONE (DELTASONE) 20 MG tablet; 2 po at sametime daily for 5 days  Dispense: 10 tablet; Refill: 0 - HYDROcodone-homatropine (HYCODAN) 5-1.5 MG/5ML syrup; Take 5 mLs by mouth every 6 (six) hours as needed for cough.  Dispense: 120 mL; Refill: 0    Labs pending Health maintenance reviewed Diet and exercise encouraged Continue all meds Follow up  In 3 month   [place], FNP

## 2015-09-25 NOTE — Patient Instructions (Signed)

## 2015-09-26 LAB — LIPID PANEL
Chol/HDL Ratio: 3.9 ratio units (ref 0.0–4.4)
Cholesterol, Total: 131 mg/dL (ref 100–199)
HDL: 34 mg/dL — AB (ref >39)
LDL CALC: 76 mg/dL (ref 0–99)
Triglycerides: 105 mg/dL (ref 0–149)
VLDL Cholesterol Cal: 21 mg/dL (ref 5–40)

## 2015-09-26 LAB — CMP14+EGFR
A/G RATIO: 2 (ref 1.1–2.5)
ALBUMIN: 4.5 g/dL (ref 3.5–5.5)
ALT: 11 IU/L (ref 0–32)
AST: 12 IU/L (ref 0–40)
Alkaline Phosphatase: 71 IU/L (ref 39–117)
BUN / CREAT RATIO: 16 (ref 9–23)
BUN: 10 mg/dL (ref 6–24)
Bilirubin Total: 0.7 mg/dL (ref 0.0–1.2)
CALCIUM: 9.2 mg/dL (ref 8.7–10.2)
CO2: 26 mmol/L (ref 18–29)
Chloride: 99 mmol/L (ref 97–106)
Creatinine, Ser: 0.64 mg/dL (ref 0.57–1.00)
GFR, EST AFRICAN AMERICAN: 126 mL/min/{1.73_m2} (ref >59)
GFR, EST NON AFRICAN AMERICAN: 110 mL/min/{1.73_m2} (ref >59)
GLOBULIN, TOTAL: 2.2 g/dL (ref 1.5–4.5)
Glucose: 108 mg/dL — ABNORMAL HIGH (ref 65–99)
POTASSIUM: 3.9 mmol/L (ref 3.5–5.2)
Sodium: 140 mmol/L (ref 136–144)
TOTAL PROTEIN: 6.7 g/dL (ref 6.0–8.5)

## 2015-10-13 ENCOUNTER — Telehealth: Payer: Self-pay | Admitting: Nurse Practitioner

## 2015-10-13 NOTE — Telephone Encounter (Signed)
Stp and advised to come in at 9 in the morning. Pt voiced understanding.

## 2015-10-14 ENCOUNTER — Ambulatory Visit (INDEPENDENT_AMBULATORY_CARE_PROVIDER_SITE_OTHER): Payer: Medicaid Other | Admitting: *Deleted

## 2015-10-14 ENCOUNTER — Ambulatory Visit: Payer: Medicaid Other

## 2015-10-14 DIAGNOSIS — Z3042 Encounter for surveillance of injectable contraceptive: Secondary | ICD-10-CM

## 2015-10-26 ENCOUNTER — Other Ambulatory Visit: Payer: Self-pay | Admitting: Nurse Practitioner

## 2015-10-27 NOTE — Telephone Encounter (Signed)
Last seen and filled 09/25/15. Call in at [facility]

## 2015-10-27 NOTE — Telephone Encounter (Signed)
Please call in klonopin with 1 refills 

## 2015-11-02 ENCOUNTER — Other Ambulatory Visit: Payer: Self-pay | Admitting: Nurse Practitioner

## 2015-11-03 ENCOUNTER — Telehealth: Payer: Self-pay | Admitting: Nurse Practitioner

## 2015-11-04 NOTE — Telephone Encounter (Signed)
Spoke to pt

## 2015-11-25 ENCOUNTER — Other Ambulatory Visit: Payer: Self-pay | Admitting: Nurse Practitioner

## 2015-11-27 ENCOUNTER — Telehealth: Payer: Self-pay

## 2015-11-27 NOTE — Telephone Encounter (Signed)
Medicaid prior authorized Losartan-HCTZ  [id number]

## 2015-12-09 ENCOUNTER — Other Ambulatory Visit: Payer: Self-pay | Admitting: Family Medicine

## 2015-12-26 ENCOUNTER — Ambulatory Visit: Payer: Medicaid Other | Admitting: Nurse Practitioner

## 2015-12-29 ENCOUNTER — Other Ambulatory Visit: Payer: Self-pay | Admitting: Nurse Practitioner

## 2015-12-29 ENCOUNTER — Encounter: Payer: Self-pay | Admitting: Nurse Practitioner

## 2015-12-31 ENCOUNTER — Ambulatory Visit (INDEPENDENT_AMBULATORY_CARE_PROVIDER_SITE_OTHER): Payer: Medicaid Other | Admitting: *Deleted

## 2015-12-31 ENCOUNTER — Encounter (INDEPENDENT_AMBULATORY_CARE_PROVIDER_SITE_OTHER): Payer: Self-pay

## 2015-12-31 DIAGNOSIS — Z3042 Encounter for surveillance of injectable contraceptive: Secondary | ICD-10-CM | POA: Diagnosis not present

## 2015-12-31 NOTE — Patient Instructions (Signed)

## 2015-12-31 NOTE — Progress Notes (Signed)
Depo Provera given and patient tolerated well. Patient is due between 4/19-5/3

## 2016-01-05 ENCOUNTER — Encounter: Payer: Self-pay | Admitting: Nurse Practitioner

## 2016-01-05 ENCOUNTER — Ambulatory Visit (INDEPENDENT_AMBULATORY_CARE_PROVIDER_SITE_OTHER): Payer: Medicaid Other | Admitting: Nurse Practitioner

## 2016-01-05 VITALS — BP 142/84 | HR 119 | Temp 97.5°F | Ht 61.0 in | Wt 268.0 lb

## 2016-01-05 DIAGNOSIS — G43109 Migraine with aura, not intractable, without status migrainosus: Secondary | ICD-10-CM | POA: Diagnosis not present

## 2016-01-05 DIAGNOSIS — R059 Cough, unspecified: Secondary | ICD-10-CM

## 2016-01-05 DIAGNOSIS — K219 Gastro-esophageal reflux disease without esophagitis: Secondary | ICD-10-CM

## 2016-01-05 DIAGNOSIS — E785 Hyperlipidemia, unspecified: Secondary | ICD-10-CM | POA: Diagnosis not present

## 2016-01-05 DIAGNOSIS — R05 Cough: Secondary | ICD-10-CM

## 2016-01-05 DIAGNOSIS — F4001 Agoraphobia with panic disorder: Secondary | ICD-10-CM

## 2016-01-05 DIAGNOSIS — B009 Herpesviral infection, unspecified: Secondary | ICD-10-CM

## 2016-01-05 DIAGNOSIS — I10 Essential (primary) hypertension: Secondary | ICD-10-CM

## 2016-01-05 DIAGNOSIS — E032 Hypothyroidism due to medicaments and other exogenous substances: Secondary | ICD-10-CM | POA: Diagnosis not present

## 2016-01-05 DIAGNOSIS — G43711 Chronic migraine without aura, intractable, with status migrainosus: Secondary | ICD-10-CM

## 2016-01-05 DIAGNOSIS — N393 Stress incontinence (female) (male): Secondary | ICD-10-CM | POA: Diagnosis not present

## 2016-01-05 DIAGNOSIS — F3162 Bipolar disorder, current episode mixed, moderate: Secondary | ICD-10-CM

## 2016-01-05 MED ORDER — BENZONATATE 100 MG PO CAPS: 100.0000 mg | ORAL_CAPSULE | Freq: Three times a day (TID) | ORAL | Status: DC | PRN

## 2016-01-05 MED ORDER — PRAVASTATIN SODIUM 40 MG PO TABS: ORAL_TABLET | ORAL | Status: DC

## 2016-01-05 MED ORDER — LEVOTHYROXINE SODIUM 88 MCG PO TABS: ORAL_TABLET | ORAL | Status: DC

## 2016-01-05 MED ORDER — CLONAZEPAM 0.5 MG PO TABS: 0.5000 mg | ORAL_TABLET | Freq: Two times a day (BID) | ORAL | Status: DC

## 2016-01-05 MED ORDER — RANITIDINE HCL 150 MG PO TABS: 150.0000 mg | ORAL_TABLET | Freq: Two times a day (BID) | ORAL | Status: DC

## 2016-01-05 MED ORDER — TRIAMCINOLONE ACETONIDE 0.1 % EX CREA: 1.0000 "application " | TOPICAL_CREAM | Freq: Two times a day (BID) | CUTANEOUS | Status: DC

## 2016-01-05 MED ORDER — LOSARTAN POTASSIUM-HCTZ 100-25 MG PO TABS: 1.0000 | ORAL_TABLET | Freq: Every day | ORAL | Status: DC

## 2016-01-05 MED ORDER — MIRABEGRON ER 50 MG PO TB24
50.0000 mg | ORAL_TABLET | Freq: Every day | ORAL | Status: DC
Start: 2016-01-05 — End: 2016-08-11

## 2016-01-05 MED ORDER — LAMOTRIGINE 100 MG PO TABS: 100.0000 mg | ORAL_TABLET | Freq: Every day | ORAL | Status: DC

## 2016-01-05 MED ORDER — SUMATRIPTAN SUCCINATE 100 MG PO TABS: 100.0000 mg | ORAL_TABLET | ORAL | Status: DC | PRN

## 2016-01-05 NOTE — Progress Notes (Signed)
Subjective:    Patient ID: Dawn Craig, female    DOB: [birth year], 45 y.o.   MRN: [id number]  Patient here today for follow up of chronic medical problems.  Outpatient Encounter Prescriptions as of 01/05/2016  Medication Sig  . betamethasone dipropionate (DIPROLENE) 0.05 % cream Apply topically 2 (two) times daily.  . Cholecalciferol (VITAMIN D) 2000 UNITS CAPS Take by mouth.  . clonazePAM (KLONOPIN) 0.5 MG tablet TAKE 1 TABLET TWICE A DAY  . fesoterodine (TOVIAZ) 8 MG TB24 tablet Take 1 tablet (8 mg total) by mouth daily.  . fluticasone (FLONASE) 50 MCG/ACT nasal spray INSTILL 1 SPRAY INTO EACH NOSTRIL 2 TIMES DAILY  . lamoTRIgine (LAMICTAL) 100 MG tablet Take 1 tablet (100 mg total) by mouth daily.  Marland Kitchen levothyroxine (SYNTHROID, LEVOTHROID) 88 MCG tablet TAKE 1 TABLET (88 MCG TOTAL) BY MOUTH DAILY.  Marland Kitchen loratadine (CLARITIN) 10 MG tablet TAKE 1 TABLET (10 MG TOTAL) BY MOUTH DAILY.  Marland Kitchen losartan-hydrochlorothiazide (HYZAAR) 100-25 MG per tablet Take 1 tablet by mouth daily.  . MedroxyPROGESTERone Acetate 150 MG/ML SUSY INJECT 1 ML (150 MG TOTAL) INTO THE MUSCLE ONCE.  . pravastatin (PRAVACHOL) 40 MG tablet TAKE 1 TABLET (40 MG TOTAL) BY MOUTH DAILY.  . ranitidine (ZANTAC) 150 MG tablet Take 1 tablet (150 mg total) by mouth 2 (two) times daily.  . SUMAtriptan (IMITREX) 100 MG tablet Take 1 tablet (100 mg total) by mouth every 2 (two) hours as needed for migraine.  . valACYclovir (VALTREX) 500 MG tablet TAKE 1 TABLET (500 MG TOTAL) BY MOUTH 2 (TWO) TIMES DAILY. (Patient not taking: Reported on 01/05/2016)  . [DISCONTINUED] doxycycline (VIBRA-TABS) 100 MG tablet Take 1 tablet (100 mg total) by mouth 2 (two) times daily. 1 po bid  . [DISCONTINUED] HYDROcodone-homatropine (HYCODAN) 5-1.5 MG/5ML syrup Take 5 mLs by mouth every 6 (six) hours as needed for cough.  . [DISCONTINUED] levothyroxine (SYNTHROID, LEVOTHROID) 88 MCG tablet TAKE 1 TABLET (88 MCG TOTAL) BY MOUTH DAILY.  . [DISCONTINUED] predniSONE  (DELTASONE) 20 MG tablet 2 po at sametime daily for 5 days   No facility-administered encounter medications on file as of 01/05/2016.     Hypertension This is a chronic problem. The current episode started more than 1 year ago. The problem is unchanged. The problem is controlled. Pertinent negatives include no chest pain or palpitations. Risk factors for coronary artery disease include dyslipidemia, obesity and sedentary lifestyle. Past treatments include diuretics and angiotensin blockers. The current treatment provides mild improvement. Compliance problems include diet and exercise.  Hypertensive end-organ damage includes a thyroid problem.  Hyperlipidemia This is a chronic problem. The current episode started more than 1 year ago. The problem is uncontrolled. Recent lipid tests were reviewed and are variable. Pertinent negatives include no chest pain or myalgias. Current antihyperlipidemic treatment includes statins. Compliance problems include adherence to diet and adherence to exercise.  Risk factors for coronary artery disease include dyslipidemia, hypertension and obesity.  Thyroid Problem Presents for follow-up (hypothyroidism) visit. Patient reports no constipation, diaphoresis, diarrhea, heat intolerance, menstrual problem, palpitations or visual change. The symptoms have been stable. Her past medical history is significant for hyperlipidemia.  Migraines Chronic problem. Says she has some type of headache everyday. Her last migraine was 4  days ago imitrex not helping anymore. Patient has not been to a headache clinic. She has recently been seeing chiropractor which seems to help with the migraines.  Bipolar/ agrophobia Chronic problem. Patient is on Lamictal. Takes Klonopin when she goes out because she  has panic attacks when she is around a lot of people. She does not have a psychiatrist here in [place]. Her last episode was very manic. Bladder leakage/stress incontinence  Started back in  November. Happens daily. Has to wear panty liners daily. Patient says she doesn't feel it sometimes. Has trouble completely emptying bladder. Does C/O urgency. She reports  Fever blisters Occur weekly lasting 6 days. Can have more than one at the same time. GERD  Patient use to be on zantac PRN.   * still has cough from recent URI   Review of Systems  Constitutional: Negative for diaphoresis.  HENT: Positive for congestion and sinus pressure. Negative for ear pain and sore throat.   Respiratory: Positive for cough.   Cardiovascular: Negative for chest pain and palpitations.  Gastrointestinal: Negative for diarrhea and constipation.  Endocrine: Negative for heat intolerance.  Genitourinary: Positive for urgency. Negative for dysuria, hematuria, flank pain, menstrual problem and pelvic pain.  Musculoskeletal: Negative for myalgias.  All other systems reviewed and are negative.      Objective:   Physical Exam  Constitutional: She is oriented to person, place, and time. She appears well-developed and well-nourished.  HENT:  Nose: Nose normal.  Mouth/Throat: Oropharynx is clear and moist.  Eyes: EOM are normal.  Neck: Trachea normal, normal range of motion and full passive range of motion without pain. Neck supple. No JVD present. Carotid bruit is not present. No thyromegaly present.  Cardiovascular: Normal rate, regular rhythm, normal heart sounds and intact distal pulses.  Exam reveals no gallop and no friction rub.   No murmur heard. Pulmonary/Chest: Effort normal. She has wheezes (exp failnt in lower lobes).  Deep dry cough  Abdominal: Soft. Bowel sounds are normal. She exhibits no distension and no mass. There is no tenderness.  Musculoskeletal: Normal range of motion.  Lymphadenopathy:    She has no cervical adenopathy.  Neurological: She is alert and oriented to person, place, and time. She has normal reflexes.  Skin: Skin is warm and dry.  Psychiatric: She has a normal mood  and affect. Her behavior is normal. Judgment and thought content normal.    BP 142/84 mmHg  Pulse 119  Temp(Src) 97.5 F (36.4 C) (Oral)  Ht '5\' 1"'  (1.549 m)  Wt 268 lb (121.564 kg)  BMI 50.66 kg/m2      Assessment & Plan:  1. Essential hypertension, benign Do n ot add salt to diet - losartan-hydrochlorothiazide (HYZAAR) 100-25 MG tablet; Take 1 tablet by mouth daily.  Dispense: 90 tablet; Refill: 1 - CMP14+EGFR  2. Intractable chronic migraine without aura and with status migrainosus  3. Gastroesophageal reflux disease, esophagitis presence not specified Avoid spicy foods Do not eat 2 hours prior to bedtime - ranitidine (ZANTAC) 150 MG tablet; Take 1 tablet (150 mg total) by mouth 2 (two) times daily.  Dispense: 90 tablet; Refill: 1  4. 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  Dispense: 90 tablet; Refill: 1  5. Agoraphobia with panic attacks Stress management - clonazePAM (KLONOPIN) 0.5 MG tablet; Take 1 tablet (0.5 mg total) by mouth 2 (two) times daily.  Dispense: 60 tablet; Refill: 2  6. Hyperlipidemia with target LDL less than 100 Low fat diet - pravastatin (PRAVACHOL) 40 MG tablet; TAKE 1 TABLET (40 MG TOTAL) BY MOUTH DAILY.  Dispense: 90 tablet; Refill: 1 - Lipid panel  7. HSV-1 (herpes simplex virus 1) infection  8. Bipolar 1 disorder, mixed, moderate (HCC) - lamoTRIgine (LAMICTAL) 100 MG tablet; Take 1 tablet (100 mg total) by mouth daily.  Dispense: 60 tablet; Refill: 5  9. Morbid obesity due to excess calories ([place]) Discussed diet and exercise for person with BMI >25 Will recheck weight in 3-6 months  10. Migraine with aura and without status migrainosus, not intractable - SUMAtriptan (IMITREX) 100 MG tablet; Take 1 tablet (100 mg total) by mouth every 2 (two) hours as needed for migraine.  Dispense: 10 tablet; Refill: 2  11. Stress  incontinence Change to myrbetriq - mirabegron ER (MYRBETRIQ) 50 MG TB24 tablet; Take 1 tablet (50 mg total) by mouth daily.  Dispense: 30 tablet; Refill: 5  12. Cough Force fluids humidifier - benzonatate (TESSALON PERLES) 100 MG capsule; Take 1 capsule (100 mg total) by mouth 3 (three) times daily as needed for cough.  Dispense: 20 capsule; Refill: 2    Labs pending Health maintenance reviewed Diet and exercise encouraged Continue all meds Follow up  In 3 month   [place], FNP

## 2016-01-06 LAB — CMP14+EGFR
ALT: 11 IU/L (ref 0–32)
AST: 18 IU/L (ref 0–40)
Albumin/Globulin Ratio: 1.8 (ref 1.1–2.5)
Albumin: 4.2 g/dL (ref 3.5–5.5)
Alkaline Phosphatase: 66 IU/L (ref 39–117)
BILIRUBIN TOTAL: 0.7 mg/dL (ref 0.0–1.2)
BUN / CREAT RATIO: 12 (ref 9–23)
BUN: 8 mg/dL (ref 6–24)
CO2: 24 mmol/L (ref 18–29)
CREATININE: 0.67 mg/dL (ref 0.57–1.00)
Calcium: 9.2 mg/dL (ref 8.7–10.2)
Chloride: 101 mmol/L (ref 96–106)
GFR calc non Af Amer: 107 mL/min/{1.73_m2} (ref >59)
GFR, EST AFRICAN AMERICAN: 124 mL/min/{1.73_m2} (ref >59)
GLUCOSE: 102 mg/dL — AB (ref 65–99)
Globulin, Total: 2.3 g/dL (ref 1.5–4.5)
Potassium: 3.6 mmol/L (ref 3.5–5.2)
Sodium: 141 mmol/L (ref 134–144)
TOTAL PROTEIN: 6.5 g/dL (ref 6.0–8.5)

## 2016-01-06 LAB — LIPID PANEL
CHOLESTEROL TOTAL: 117 mg/dL (ref 100–199)
Chol/HDL Ratio: 3.7 ratio units (ref 0.0–4.4)
HDL: 32 mg/dL — AB (ref >39)
LDL Calculated: 67 mg/dL (ref 0–99)
Triglycerides: 92 mg/dL (ref 0–149)
VLDL CHOLESTEROL CAL: 18 mg/dL (ref 5–40)

## 2016-01-09 ENCOUNTER — Telehealth: Payer: Self-pay

## 2016-01-09 NOTE — Telephone Encounter (Signed)
Insurance prior authorized [employer]  [id number]

## 2016-01-09 NOTE — Telephone Encounter (Signed)
Tell patien tinsurance will not pay for myrbetriq- does she want to go back on detrol LA or oxybutin

## 2016-01-09 NOTE — Telephone Encounter (Signed)
Patient states that we usually do a prior authorization. She has spoke with the insurance and they said we should do this. Can this be done?

## 2016-01-09 NOTE — Telephone Encounter (Signed)
Medicaid non preferred Myretriq  Preferred are Oxybutynoin syrup-tablet, Toviaz tablet, Vesicare Tablet

## 2016-02-08 ENCOUNTER — Other Ambulatory Visit: Payer: Self-pay | Admitting: Nurse Practitioner

## 2016-02-09 NOTE — Telephone Encounter (Signed)
Please call in clonazepam with 1 refills 

## 2016-02-09 NOTE — Telephone Encounter (Signed)
Last filled 12/29/15, last seen 01/05/16. [facility]

## 2016-02-09 NOTE — Telephone Encounter (Signed)
rx called into pharmacy

## 2016-02-10 ENCOUNTER — Telehealth: Payer: Self-pay | Admitting: Nurse Practitioner

## 2016-02-11 NOTE — Telephone Encounter (Signed)
Patient aware that [facility] for [facility]epam [facility]. Pharmacy called.

## 2016-03-09 ENCOUNTER — Other Ambulatory Visit: Payer: Self-pay | Admitting: Nurse Practitioner

## 2016-03-17 ENCOUNTER — Ambulatory Visit: Payer: Medicaid Other

## 2016-03-22 ENCOUNTER — Ambulatory Visit (INDEPENDENT_AMBULATORY_CARE_PROVIDER_SITE_OTHER): Payer: Medicaid Other | Admitting: *Deleted

## 2016-03-22 ENCOUNTER — Encounter (INDEPENDENT_AMBULATORY_CARE_PROVIDER_SITE_OTHER): Payer: Self-pay

## 2016-03-22 DIAGNOSIS — Z3042 Encounter for surveillance of injectable contraceptive: Secondary | ICD-10-CM | POA: Diagnosis not present

## 2016-03-22 MED ORDER — MEDROXYPROGESTERONE ACETATE 150 MG/ML IM SUSP
150.0000 mg | INTRAMUSCULAR | Status: AC
  Administered 2016-03-22 – 2016-12-01 (×4): 150 mg via INTRAMUSCULAR

## 2016-03-22 NOTE — Progress Notes (Deleted)
   Subjective:    Patient ID: Dawn Craig, female    DOB: Jan 28, 1971, 45 y.o.   MRN: [id number]  HPI    Review of Systems     Objective:   Physical Exam        Assessment & Plan:

## 2016-03-22 NOTE — Progress Notes (Signed)
Depo Provera injection given and patient tolerated well. Patient's next depo provera due 7/10-7/24.

## 2016-03-22 NOTE — Patient Instructions (Signed)

## 2016-04-15 ENCOUNTER — Ambulatory Visit (INDEPENDENT_AMBULATORY_CARE_PROVIDER_SITE_OTHER): Payer: Medicaid Other | Admitting: Nurse Practitioner

## 2016-04-15 ENCOUNTER — Encounter: Payer: Self-pay | Admitting: Nurse Practitioner

## 2016-04-15 VITALS — BP 136/88 | HR 95 | Temp 98.1°F | Ht 61.0 in | Wt 265.0 lb

## 2016-04-15 DIAGNOSIS — I10 Essential (primary) hypertension: Secondary | ICD-10-CM

## 2016-04-15 DIAGNOSIS — F3162 Bipolar disorder, current episode mixed, moderate: Secondary | ICD-10-CM | POA: Diagnosis not present

## 2016-04-15 DIAGNOSIS — Z9289 Personal history of other medical treatment: Secondary | ICD-10-CM | POA: Diagnosis not present

## 2016-04-15 DIAGNOSIS — K219 Gastro-esophageal reflux disease without esophagitis: Secondary | ICD-10-CM

## 2016-04-15 DIAGNOSIS — E785 Hyperlipidemia, unspecified: Secondary | ICD-10-CM | POA: Diagnosis not present

## 2016-04-15 DIAGNOSIS — E032 Hypothyroidism due to medicaments and other exogenous substances: Secondary | ICD-10-CM | POA: Diagnosis not present

## 2016-04-15 DIAGNOSIS — F4001 Agoraphobia with panic disorder: Secondary | ICD-10-CM

## 2016-04-15 DIAGNOSIS — F5081 Binge eating disorder: Secondary | ICD-10-CM | POA: Diagnosis not present

## 2016-04-15 DIAGNOSIS — G43711 Chronic migraine without aura, intractable, with status migrainosus: Secondary | ICD-10-CM

## 2016-04-15 MED ORDER — CLONAZEPAM 0.5 MG PO TABS: 0.5000 mg | ORAL_TABLET | Freq: Two times a day (BID) | ORAL | Status: DC

## 2016-04-15 NOTE — Progress Notes (Signed)
Subjective:    Patient ID: Dawn Craig, female    DOB: 1971/04/12, 45 y.o.   MRN: [id number]  Patient here today for follow up of chronic medical problems. NO complaints today  Outpatient Encounter Prescriptions as of 04/15/2016  Medication Sig  . benzonatate (TESSALON PERLES) 100 MG capsule Take 1 capsule (100 mg total) by mouth 3 (three) times daily as needed for cough.  . betamethasone dipropionate (DIPROLENE) 0.05 % cream Apply topically 2 (two) times daily.  . Cholecalciferol (VITAMIN D) 2000 UNITS CAPS Take by mouth.  . clonazePAM (KLONOPIN) 0.5 MG tablet TAKE 1 TABLET BY MOUTH TWICE A DAY  . fluticasone (FLONASE) 50 MCG/ACT nasal spray INSTILL 1 SPRAY INTO EACH NOSTRIL 2 TIMES DAILY  . lamoTRIgine (LAMICTAL) 100 MG tablet Take 1 tablet (100 mg total) by mouth daily.  Marland Kitchen levothyroxine (SYNTHROID, LEVOTHROID) 88 MCG tablet TAKE 1 TABLET (88 MCG TOTAL) BY MOUTH DAILY.  Marland Kitchen loratadine (CLARITIN) 10 MG tablet TAKE 1 TABLET (10 MG TOTAL) BY MOUTH DAILY.  Marland Kitchen losartan-hydrochlorothiazide (HYZAAR) 100-25 MG tablet Take 1 tablet by mouth daily.  . mirabegron ER (MYRBETRIQ) 50 MG TB24 tablet Take 1 tablet (50 mg total) by mouth daily.  . pravastatin (PRAVACHOL) 40 MG tablet TAKE 1 TABLET (40 MG TOTAL) BY MOUTH DAILY.  . ranitidine (ZANTAC) 150 MG tablet Take 1 tablet (150 mg total) by mouth 2 (two) times daily.  . SUMAtriptan (IMITREX) 100 MG tablet Take 1 tablet (100 mg total) by mouth every 2 (two) hours as needed for migraine.  . triamcinolone cream (KENALOG) 0.1 % Apply 1 application topically 2 (two) times daily.  . valACYclovir (VALTREX) 500 MG tablet TAKE 1 TABLET (500 MG TOTAL) BY MOUTH 2 (TWO) TIMES DAILY.          Hypertension This is a chronic problem. The current episode started more than 1 year ago. The problem is unchanged. The problem is controlled. Pertinent negatives include no chest pain or palpitations. Risk factors for coronary artery disease include dyslipidemia, obesity  and sedentary lifestyle. Past treatments include diuretics and angiotensin blockers. The current treatment provides mild improvement. Compliance problems include diet and exercise.  Hypertensive end-organ damage includes a thyroid problem.  Hyperlipidemia This is a chronic problem. The current episode started more than 1 year ago. The problem is uncontrolled. Recent lipid tests were reviewed and are variable. Pertinent negatives include no chest pain or myalgias. Current antihyperlipidemic treatment includes statins. Compliance problems include adherence to diet and adherence to exercise.  Risk factors for coronary artery disease include dyslipidemia, hypertension and obesity.  Thyroid Problem Presents for follow-up (hypothyroidism) visit. Patient reports no constipation, diaphoresis, diarrhea, heat intolerance, menstrual problem, palpitations or visual change. The symptoms have been stable. Her past medical history is significant for hyperlipidemia.  Migraines Chronic problem. Says she has some type of headache everyday. Her last migraine was 4  days ago imitrex not helping anymore. Patient has not been to a headache clinic. She has recently been seeing chiropractor which seems to help with the migraines.  Bipolar/ agrophobia Chronic problem. Patient is on Lamictal. Takes Klonopin when she goes out because she has panic attacks when she is around a lot of people. She does not have a psychiatrist here in [place]. Her last episode was very manic. Bladder leakage/stress incontinence  Started back in November. Happens daily. Has to wear panty liners daily. Patient says she doesn't feel it sometimes. Has trouble completely emptying bladder. Does C/O urgency. She reports  Fever blisters Occur  weekly lasting 6 days. Can have more than one at the same time. GERD  Patient use to be on zantac PRN.      Review of Systems  Constitutional: Negative for diaphoresis.  HENT: Positive for congestion and sinus  pressure. Negative for ear pain and sore throat.   Respiratory: Positive for cough.   Cardiovascular: Negative for chest pain and palpitations.  Gastrointestinal: Negative for diarrhea and constipation.  Endocrine: Negative for heat intolerance.  Genitourinary: Positive for urgency. Negative for dysuria, hematuria, flank pain, menstrual problem and pelvic pain.  Musculoskeletal: Negative for myalgias.  All other systems reviewed and are negative.      Objective:   Physical Exam  Constitutional: She is oriented to person, place, and time. She appears well-developed and well-nourished.  HENT:  Nose: Nose normal.  Mouth/Throat: Oropharynx is clear and moist.  Eyes: EOM are normal.  Neck: Trachea normal, normal range of motion and full passive range of motion without pain. Neck supple. No JVD present. Carotid bruit is not present. No thyromegaly present.  Cardiovascular: Normal rate, regular rhythm, normal heart sounds and intact distal pulses.  Exam reveals no gallop and no friction rub.   No murmur heard. Pulmonary/Chest: Effort normal. She has wheezes (exp failnt in lower lobes).  Deep dry cough  Abdominal: Soft. Bowel sounds are normal. She exhibits no distension and no mass. There is no tenderness.  Musculoskeletal: Normal range of motion.  Lymphadenopathy:    She has no cervical adenopathy.  Neurological: She is alert and oriented to person, place, and time. She has normal reflexes.  Skin: Skin is warm and dry.  Psychiatric: She has a normal mood and affect. Her behavior is normal. Judgment and thought content normal.    BP 136/88 mmHg  Pulse 95  Temp(Src) 98.1 F (36.7 C) (Oral)  Ht '5\' 1"'  (1.549 m)  Wt 265 lb (120.203 kg)  BMI 50.10 kg/m2        Assessment & Plan:  1. Essential hypertension, benign Do not add salt to diet - CMP14+EGFR  2. Intractable chronic migraine without aura and with status migrainosus  3. Gastroesophageal reflux disease, esophagitis  presence not specified Avoid spicy foods Do not eat 2 hours prior to bedtime  4. 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 Hyperlipidemia with target LDL less than 100 Low fta diet - Lipid panel  6. Bipolar 1 disorder, mixed, moderate (HCC) Continue current meds  7. Morbid obesity due to excess calories ([place]) Discussed diet and exercise for person with BMI >25 Will recheck weight in 3-6 months  8. Binge eating disorder  9. Agoraphobia with panic attacks - clonazePAM (KLONOPIN) 0.5 MG tablet; Take 1 tablet (0.5 mg total) by mouth 2 (two) times daily.  Dispense: 60 tablet; Refill: 2  10. H/O mammogram - MM Digital Screening; Future    Labs pending Health maintenance reviewed Diet and exercise encouraged Continue all meds Follow up  In 6 months   [place], FNP

## 2016-04-15 NOTE — Patient Instructions (Signed)
Health Maintenance, Female Adopting a healthy lifestyle and getting preventive care can go a long way to promote health and wellness. Talk with your health care provider about what schedule of regular examinations is right for you. This is a good chance for you to check in with your provider about disease prevention and staying healthy. In between checkups, there are plenty of things you can do on your own. Experts have done a lot of research about which lifestyle changes and preventive measures are most likely to keep you healthy. Ask your health care provider for more information. WEIGHT AND DIET  Eat a healthy diet  Be sure to include plenty of vegetables, fruits, low-fat dairy products, and lean protein.  Do not eat a lot of foods high in solid fats, added sugars, or salt.  Get regular exercise. This is one of the most important things you can do for your health.  Most adults should exercise for at least 150 minutes each week. The exercise should increase your heart rate and make you sweat (moderate-intensity exercise).  Most adults should also do strengthening exercises at least twice a week. This is in addition to the moderate-intensity exercise.  Maintain a healthy weight  Body mass index (BMI) is a measurement that can be used to identify possible weight problems. It estimates body fat based on height and weight. Your health care provider can help determine your BMI and help you achieve or maintain a healthy weight.  For females 20 years of age and older:   A BMI below 18.5 is considered underweight.  A BMI of 18.5 to 24.9 is normal.  A BMI of 25 to 29.9 is considered overweight.  A BMI of 30 and above is considered obese.  Watch levels of cholesterol and blood lipids  You should start having your blood tested for lipids and cholesterol at 45 years of age, then have this test every 5 years.  You may need to have your cholesterol levels checked more often if:  Your lipid  or cholesterol levels are high.  You are older than 45 years of age.  You are at high risk for heart disease.  CANCER SCREENING   Lung Cancer  Lung cancer screening is recommended for adults 55-80 years old who are at high risk for lung cancer because of a history of smoking.  A yearly low-dose CT scan of the lungs is recommended for people who:  Currently smoke.  Have quit within the past 15 years.  Have at least a 30-pack-year history of smoking. A pack year is smoking an average of one pack of cigarettes a day for 1 year.  Yearly screening should continue until it has been 15 years since you quit.  Yearly screening should stop if you develop a health problem that would prevent you from having lung cancer treatment.  Breast Cancer  Practice breast self-awareness. This means understanding how your breasts normally appear and feel.  It also means doing regular breast self-exams. Let your health care provider know about any changes, no matter how small.  If you are in your 20s or 30s, you should have a clinical breast exam (CBE) by a health care provider every 1-3 years as part of a regular health exam.  If you are 40 or older, have a CBE every year. Also consider having a breast X-ray (mammogram) every year.  If you have a family history of breast cancer, talk to your health care provider about genetic screening.  If you   are at high risk for breast cancer, talk to your health care provider about having an MRI and a mammogram every year.  Breast cancer gene (BRCA) assessment is recommended for women who have family members with BRCA-related cancers. BRCA-related cancers include:  Breast.  Ovarian.  Tubal.  Peritoneal cancers.  Results of the assessment will determine the need for genetic counseling and BRCA1 and BRCA2 testing. Cervical Cancer Your health care provider may recommend that you be screened regularly for cancer of the pelvic organs (ovaries, uterus, and  vagina). This screening involves a pelvic examination, including checking for microscopic changes to the surface of your cervix (Pap test). You may be encouraged to have this screening done every 3 years, beginning at age 21.  For women ages 30-65, health care providers may recommend pelvic exams and Pap testing every 3 years, or they may recommend the Pap and pelvic exam, combined with testing for human papilloma virus (HPV), every 5 years. Some types of HPV increase your risk of cervical cancer. Testing for HPV may also be done on women of any age with unclear Pap test results.  Other health care providers may not recommend any screening for nonpregnant women who are considered low risk for pelvic cancer and who do not have symptoms. Ask your health care provider if a screening pelvic exam is right for you.  If you have had past treatment for cervical cancer or a condition that could lead to cancer, you need Pap tests and screening for cancer for at least 20 years after your treatment. If Pap tests have been discontinued, your risk factors (such as having a new sexual partner) need to be reassessed to determine if screening should resume. Some women have medical problems that increase the chance of getting cervical cancer. In these cases, your health care provider may recommend more frequent screening and Pap tests. Colorectal Cancer  This type of cancer can be detected and often prevented.  Routine colorectal cancer screening usually begins at 45 years of age and continues through 45 years of age.  Your health care provider may recommend screening at an earlier age if you have risk factors for colon cancer.  Your health care provider may also recommend using home test kits to check for hidden blood in the stool.  A small camera at the end of a tube can be used to examine your colon directly (sigmoidoscopy or colonoscopy). This is done to check for the earliest forms of colorectal  cancer.  Routine screening usually begins at age 50.  Direct examination of the colon should be repeated every 5-10 years through 45 years of age. However, you may need to be screened more often if early forms of precancerous polyps or small growths are found. Skin Cancer  Check your skin from head to toe regularly.  Tell your health care provider about any new moles or changes in moles, especially if there is a change in a mole's shape or color.  Also tell your health care provider if you have a mole that is larger than the size of a pencil eraser.  Always use sunscreen. Apply sunscreen liberally and repeatedly throughout the day.  Protect yourself by wearing long sleeves, pants, a wide-brimmed hat, and sunglasses whenever you are outside. HEART DISEASE, DIABETES, AND HIGH BLOOD PRESSURE   High blood pressure causes heart disease and increases the risk of stroke. High blood pressure is more likely to develop in:  People who have blood pressure in the high end   of the normal range (130-139/85-89 mm Hg).  People who are overweight or obese.  People who are African American.  If you are 38-23 years of age, have your blood pressure checked every 3-5 years. If you are 61 years of age or older, have your blood pressure checked every year. You should have your blood pressure measured twice--once when you are at a hospital or clinic, and once when you are not at a hospital or clinic. Record the average of the two measurements. To check your blood pressure when you are not at a hospital or clinic, you can use:  An automated blood pressure machine at a pharmacy.  A home blood pressure monitor.  If you are between 45 years and 39 years old, ask your health care provider if you should take aspirin to prevent strokes.  Have regular diabetes screenings. This involves taking a blood sample to check your fasting blood sugar level.  If you are at a normal weight and have a low risk for diabetes,  have this test once every three years after 45 years of age.  If you are overweight and have a high risk for diabetes, consider being tested at a younger age or more often. PREVENTING INFECTION  Hepatitis B  If you have a higher risk for hepatitis B, you should be screened for this virus. You are considered at high risk for hepatitis B if:  You were born in a country where hepatitis B is common. Ask your health care provider which countries are considered high risk.  Your parents were born in a high-risk country, and you have not been immunized against hepatitis B (hepatitis B vaccine).  You have HIV or AIDS.  You use needles to inject street drugs.  You live with someone who has hepatitis B.  You have had sex with someone who has hepatitis B.  You get hemodialysis treatment.  You take certain medicines for conditions, including cancer, organ transplantation, and autoimmune conditions. Hepatitis C  Blood testing is recommended for:  Everyone born from [birth year] through 1965.  Anyone with known risk factors for hepatitis C. Sexually transmitted infections (STIs)  You should be screened for sexually transmitted infections (STIs) including gonorrhea and chlamydia if:  You are sexually active and are younger than 45 years of age.  You are older than 45 years of age and your health care provider tells you that you are at risk for this type of infection.  Your sexual activity has changed since you were last screened and you are at an increased risk for chlamydia or gonorrhea. Ask your health care provider if you are at risk.  If you do not have HIV, but are at risk, it may be recommended that you take a prescription medicine daily to prevent HIV infection. This is called pre-exposure prophylaxis (PrEP). You are considered at risk if:  You are sexually active and do not regularly use condoms or know the HIV status of your partner(s).  You take drugs by injection.  You are sexually  active with a partner who has HIV. Talk with your health care provider about whether you are at high risk of being infected with HIV. If you choose to begin PrEP, you should first be tested for HIV. You should then be tested every 3 months for as long as you are taking PrEP.  PREGNANCY   If you are premenopausal and you may become pregnant, ask your health care provider about preconception counseling.  If you may  become pregnant, take 400 to 800 micrograms (mcg) of folic acid every day.  If you want to prevent pregnancy, talk to your health care provider about birth control (contraception). OSTEOPOROSIS AND MENOPAUSE   Osteoporosis is a disease in which the bones lose minerals and strength with aging. This can result in serious bone fractures. Your risk for osteoporosis can be identified using a bone density scan.  If you are 61 years of age or older, or if you are at risk for osteoporosis and fractures, ask your health care provider if you should be screened.  Ask your health care provider whether you should take a calcium or vitamin D supplement to lower your risk for osteoporosis.  Menopause may have certain physical symptoms and risks.  Hormone replacement therapy may reduce some of these symptoms and risks. Talk to your health care provider about whether hormone replacement therapy is right for you.  HOME CARE INSTRUCTIONS   Schedule regular health, dental, and eye exams.  Stay current with your immunizations.   Do not use any tobacco products including cigarettes, chewing tobacco, or electronic cigarettes.  If you are pregnant, do not drink alcohol.  If you are breastfeeding, limit how much and how often you drink alcohol.  Limit alcohol intake to no more than 1 drink per day for nonpregnant women. One drink equals 12 ounces of beer, 5 ounces of wine, or 1 ounces of hard liquor.  Do not use street drugs.  Do not share needles.  Ask your health care provider for help if  you need support or information about quitting drugs.  Tell your health care provider if you often feel depressed.  Tell your health care provider if you have ever been abused or do not feel safe at home.   This information is not intended to replace advice given to you by your health care provider. Make sure you discuss any questions you have with your health care provider.   Document Released: 05/31/2011 Document Revised: 12/06/2014 Document Reviewed: 10/17/2013 Elsevier Interactive Patient Education [employer].

## 2016-04-16 LAB — CMP14+EGFR
A/G RATIO: 1.8 (ref 1.2–2.2)
ALT: 17 IU/L (ref 0–32)
AST: 20 IU/L (ref 0–40)
Albumin: 4.6 g/dL (ref 3.5–5.5)
Alkaline Phosphatase: 71 IU/L (ref 39–117)
BUN / CREAT RATIO: 11 (ref 9–23)
BUN: 8 mg/dL (ref 6–24)
Bilirubin Total: 0.9 mg/dL (ref 0.0–1.2)
CALCIUM: 9.6 mg/dL (ref 8.7–10.2)
CO2: 21 mmol/L (ref 18–29)
CREATININE: 0.76 mg/dL (ref 0.57–1.00)
Chloride: 101 mmol/L (ref 96–106)
GFR calc Af Amer: 110 mL/min/{1.73_m2} (ref >59)
GFR, EST NON AFRICAN AMERICAN: 96 mL/min/{1.73_m2} (ref >59)
Globulin, Total: 2.5 g/dL (ref 1.5–4.5)
Glucose: 112 mg/dL — ABNORMAL HIGH (ref 65–99)
Potassium: 3.4 mmol/L — ABNORMAL LOW (ref 3.5–5.2)
Sodium: 142 mmol/L (ref 134–144)
Total Protein: 7.1 g/dL (ref 6.0–8.5)

## 2016-04-16 LAB — BAYER DCA HB A1C WAIVED: HB A1C (BAYER DCA - WAIVED): 6.2 % (ref <7.0)

## 2016-04-16 LAB — LIPID PANEL
Chol/HDL Ratio: 3.6 ratio units (ref 0.0–4.4)
Cholesterol, Total: 116 mg/dL (ref 100–199)
HDL: 32 mg/dL — ABNORMAL LOW (ref >39)
LDL CALC: 66 mg/dL (ref 0–99)
TRIGLYCERIDES: 92 mg/dL (ref 0–149)
VLDL CHOLESTEROL CAL: 18 mg/dL (ref 5–40)

## 2016-04-16 NOTE — Addendum Note (Signed)
Addended by: Bennie PieriniMARTIN, MARY-MARGARET on: 04/16/2016 02:36 PM   Modules accepted: Orders

## 2016-06-07 ENCOUNTER — Other Ambulatory Visit: Payer: Self-pay | Admitting: Nurse Practitioner

## 2016-06-09 ENCOUNTER — Ambulatory Visit: Payer: Medicaid Other

## 2016-06-16 ENCOUNTER — Ambulatory Visit (INDEPENDENT_AMBULATORY_CARE_PROVIDER_SITE_OTHER): Payer: Medicaid Other

## 2016-06-16 DIAGNOSIS — Z3042 Encounter for surveillance of injectable contraceptive: Secondary | ICD-10-CM | POA: Diagnosis not present

## 2016-06-16 NOTE — Progress Notes (Signed)
Depo Provera injection given and patient tolerated well.  Patient's next Depo Provera due between 09/01/16 - 09/15/16.  Patient's appointment was made for 09/08/16 at 3:00 pm.

## 2016-06-27 ENCOUNTER — Other Ambulatory Visit: Payer: Self-pay | Admitting: Nurse Practitioner

## 2016-07-21 ENCOUNTER — Encounter: Payer: Medicaid Other | Admitting: *Deleted

## 2016-07-23 ENCOUNTER — Other Ambulatory Visit: Payer: Medicaid Other | Admitting: Nurse Practitioner

## 2016-08-11 ENCOUNTER — Other Ambulatory Visit: Payer: Self-pay | Admitting: Nurse Practitioner

## 2016-08-11 DIAGNOSIS — N393 Stress incontinence (female) (male): Secondary | ICD-10-CM

## 2016-09-07 ENCOUNTER — Other Ambulatory Visit: Payer: Self-pay | Admitting: Nurse Practitioner

## 2016-09-07 DIAGNOSIS — I10 Essential (primary) hypertension: Secondary | ICD-10-CM

## 2016-09-08 ENCOUNTER — Ambulatory Visit (INDEPENDENT_AMBULATORY_CARE_PROVIDER_SITE_OTHER): Payer: Medicaid Other | Admitting: *Deleted

## 2016-09-08 DIAGNOSIS — Z3042 Encounter for surveillance of injectable contraceptive: Secondary | ICD-10-CM | POA: Diagnosis not present

## 2016-09-08 NOTE — Progress Notes (Signed)
Pt given Depo-provera inj Tolerated well 

## 2016-10-25 ENCOUNTER — Ambulatory Visit (INDEPENDENT_AMBULATORY_CARE_PROVIDER_SITE_OTHER): Payer: Medicaid Other | Admitting: Nurse Practitioner

## 2016-10-25 ENCOUNTER — Encounter: Payer: Self-pay | Admitting: Nurse Practitioner

## 2016-10-25 VITALS — BP 144/96 | HR 111 | Temp 97.4°F | Ht 61.0 in | Wt 283.0 lb

## 2016-10-25 DIAGNOSIS — S86911A Strain of unspecified muscle(s) and tendon(s) at lower leg level, right leg, initial encounter: Secondary | ICD-10-CM

## 2016-10-25 DIAGNOSIS — Z01419 Encounter for gynecological examination (general) (routine) without abnormal findings: Secondary | ICD-10-CM | POA: Diagnosis not present

## 2016-10-25 DIAGNOSIS — F4001 Agoraphobia with panic disorder: Secondary | ICD-10-CM

## 2016-10-25 DIAGNOSIS — F3162 Bipolar disorder, current episode mixed, moderate: Secondary | ICD-10-CM

## 2016-10-25 DIAGNOSIS — K219 Gastro-esophageal reflux disease without esophagitis: Secondary | ICD-10-CM

## 2016-10-25 DIAGNOSIS — Z23 Encounter for immunization: Secondary | ICD-10-CM | POA: Diagnosis not present

## 2016-10-25 DIAGNOSIS — I10 Essential (primary) hypertension: Secondary | ICD-10-CM

## 2016-10-25 DIAGNOSIS — F5081 Binge eating disorder: Secondary | ICD-10-CM

## 2016-10-25 DIAGNOSIS — R05 Cough: Secondary | ICD-10-CM

## 2016-10-25 DIAGNOSIS — Z0000 Encounter for general adult medical examination without abnormal findings: Secondary | ICD-10-CM | POA: Diagnosis not present

## 2016-10-25 DIAGNOSIS — E032 Hypothyroidism due to medicaments and other exogenous substances: Secondary | ICD-10-CM

## 2016-10-25 DIAGNOSIS — R059 Cough, unspecified: Secondary | ICD-10-CM

## 2016-10-25 DIAGNOSIS — E785 Hyperlipidemia, unspecified: Secondary | ICD-10-CM

## 2016-10-25 DIAGNOSIS — G43711 Chronic migraine without aura, intractable, with status migrainosus: Secondary | ICD-10-CM

## 2016-10-25 MED ORDER — LOSARTAN POTASSIUM-HCTZ 100-25 MG PO TABS: 1.0000 | ORAL_TABLET | Freq: Every day | ORAL | 0 refills | Status: DC

## 2016-10-25 MED ORDER — CLONAZEPAM 0.5 MG PO TABS: 0.5000 mg | ORAL_TABLET | Freq: Two times a day (BID) | ORAL | 2 refills | Status: DC

## 2016-10-25 MED ORDER — RANITIDINE HCL 150 MG PO TABS: 150.0000 mg | ORAL_TABLET | Freq: Two times a day (BID) | ORAL | 1 refills | Status: DC

## 2016-10-25 MED ORDER — NAPROXEN 500 MG PO TABS: 500.0000 mg | ORAL_TABLET | Freq: Two times a day (BID) | ORAL | 1 refills | Status: DC

## 2016-10-25 MED ORDER — PRAVASTATIN SODIUM 40 MG PO TABS: ORAL_TABLET | ORAL | 1 refills | Status: DC

## 2016-10-25 MED ORDER — LAMOTRIGINE 100 MG PO TABS: 100.0000 mg | ORAL_TABLET | Freq: Every day | ORAL | 5 refills | Status: DC

## 2016-10-25 MED ORDER — LEVOTHYROXINE SODIUM 88 MCG PO TABS: ORAL_TABLET | ORAL | 1 refills | Status: DC

## 2016-10-25 MED ORDER — BENZONATATE 100 MG PO CAPS: 100.0000 mg | ORAL_CAPSULE | Freq: Three times a day (TID) | ORAL | 2 refills | Status: DC | PRN

## 2016-10-25 NOTE — Patient Instructions (Signed)
Stress and Stress Management Stress is a normal reaction to life events. It is what you feel when life demands more than you are used to or more than you can handle. Some stress can be useful. For example, the stress reaction can help you catch the last bus of the day, study for a test, or meet a deadline at work. But stress that occurs too often or for too long can cause problems. It can affect your emotional health and interfere with relationships and normal daily activities. Too much stress can weaken your immune system and increase your risk for physical illness. If you already have a medical problem, stress can make it worse. What are the causes? All sorts of life events may cause stress. An event that causes stress for one person may not be stressful for another person. Major life events commonly cause stress. These may be positive or negative. Examples include losing your job, moving into a new home, getting married, having a baby, or losing a loved one. Less obvious life events may also cause stress, especially if they occur day after day or in combination. Examples include working long hours, driving in traffic, caring for children, being in debt, or being in a difficult relationship. What are the signs or symptoms? Stress may cause emotional symptoms including, the following:  Anxiety. This is feeling worried, afraid, on edge, overwhelmed, or out of control.  Anger. This is feeling irritated or impatient.  Depression. This is feeling sad, down, helpless, or guilty.  Difficulty focusing, remembering, or making decisions. Stress may cause physical symptoms, including the following:  Aches and pains. These may affect your head, neck, back, stomach, or other areas of your body.  Tight muscles or clenched jaw.  Low energy or trouble sleeping. Stress may cause unhealthy behaviors, including the following:  Eating to feel better (overeating) or skipping meals.  Sleeping too little, too  much, or both.  Working too much or putting off tasks (procrastination).  Smoking, drinking alcohol, or using drugs to feel better. How is this diagnosed? Stress is diagnosed through an assessment by your health care provider. Your health care provider will ask questions about your symptoms and any stressful life events.Your health care provider will also ask about your medical history and may order blood tests or other tests. Certain medical conditions and medicine can cause physical symptoms similar to stress. Mental illness can cause emotional symptoms and unhealthy behaviors similar to stress. Your health care provider may refer you to a mental health professional for further evaluation. How is this treated? Stress management is the recommended treatment for stress.The goals of stress management are reducing stressful life events and coping with stress in healthy ways. Techniques for reducing stressful life events include the following:  Stress identification. Self-monitor for stress and identify what causes stress for you. These skills may help you to avoid some stressful events.  Time management. Set your priorities, keep a calendar of events, and learn to say "no." These tools can help you avoid making too many commitments. Techniques for coping with stress include the following:  Rethinking the problem. Try to think realistically about stressful events rather than ignoring them or overreacting. Try to find the positives in a stressful situation rather than focusing on the negatives.  Exercise. Physical exercise can release both physical and emotional tension. The key is to find a form of exercise you enjoy and do it regularly.  Relaxation techniques. These relax the body and mind. Examples include yoga,  meditation, tai chi, biofeedback, deep breathing, progressive muscle relaxation, listening to music, being out in nature, journaling, and other hobbies. Again, the key is to find one or  more that you enjoy and can do regularly.  Healthy lifestyle. Eat a balanced diet, get plenty of sleep, and do not smoke. Avoid using alcohol or drugs to relax.  Strong support network. Spend time with family, friends, or other people you enjoy being around.Express your feelings and talk things over with someone you trust. Counseling or talktherapy with a mental health professional may be helpful if you are having difficulty managing stress on your own. Medicine is typically not recommended for the treatment of stress.Talk to your health care provider if you think you need medicine for symptoms of stress. Follow these instructions at home:  Keep all follow-up visits as directed by your health care provider.  Take all medicines as directed by your health care provider. Contact a health care provider if:  Your symptoms get worse or you start having new symptoms.  You feel overwhelmed by your problems and can no longer manage them on your own. Get help right away if:  You feel like hurting yourself or someone else. This information is not intended to replace advice given to you by your health care provider. Make sure you discuss any questions you have with your health care provider. Document Released: 05/11/2001 Document Revised: 04/22/2016 Document Reviewed: 07/10/2013 Elsevier Interactive Patient Education  2017 [employer].

## 2016-10-25 NOTE — Progress Notes (Addendum)
Subjective:    Patient ID: Dawn Craig, female    DOB: 1971/11/18, 45 y.o.   MRN: [id number]  Patient here today for  Annual physical exam, PAP andfollow up of chronic medical problems. NO complaints today  Outpatient Encounter Prescriptions as of 04/15/2016  Medication Sig  . benzonatate (TESSALON PERLES) 100 MG capsule Take 1 capsule (100 mg total) by mouth 3 (three) times daily as needed for cough.  . betamethasone dipropionate (DIPROLENE) 0.05 % cream Apply topically 2 (two) times daily.  . Cholecalciferol (VITAMIN D) 2000 UNITS CAPS Take by mouth.  . clonazePAM (KLONOPIN) 0.5 MG tablet TAKE 1 TABLET BY MOUTH TWICE A DAY  . fluticasone (FLONASE) 50 MCG/ACT nasal spray INSTILL 1 SPRAY INTO EACH NOSTRIL 2 TIMES DAILY  . lamoTRIgine (LAMICTAL) 100 MG tablet Take 1 tablet (100 mg total) by mouth daily.  Marland Kitchen levothyroxine (SYNTHROID, LEVOTHROID) 88 MCG tablet TAKE 1 TABLET (88 MCG TOTAL) BY MOUTH DAILY.  Marland Kitchen loratadine (CLARITIN) 10 MG tablet TAKE 1 TABLET (10 MG TOTAL) BY MOUTH DAILY.  Marland Kitchen losartan-hydrochlorothiazide (HYZAAR) 100-25 MG tablet Take 1 tablet by mouth daily.  . mirabegron ER (MYRBETRIQ) 50 MG TB24 tablet Take 1 tablet (50 mg total) by mouth daily.  . pravastatin (PRAVACHOL) 40 MG tablet TAKE 1 TABLET (40 MG TOTAL) BY MOUTH DAILY.  . ranitidine (ZANTAC) 150 MG tablet Take 1 tablet (150 mg total) by mouth 2 (two) times daily.  . SUMAtriptan (IMITREX) 100 MG tablet Take 1 tablet (100 mg total) by mouth every 2 (two) hours as needed for migraine.  . triamcinolone cream (KENALOG) 0.1 % Apply 1 application topically 2 (two) times daily.  . valACYclovir (VALTREX) 500 MG tablet TAKE 1 TABLET (500 MG TOTAL) BY MOUTH 2 (TWO) TIMES DAILY.       Hypertension  This is a chronic problem. The current episode started more than 1 year ago. The problem is unchanged. The problem is controlled. Pertinent negatives include no chest pain or palpitations. Risk factors for coronary artery disease include  dyslipidemia, obesity and sedentary lifestyle. Past treatments include diuretics and angiotensin blockers. The current treatment provides mild improvement. Compliance problems include diet and exercise.  Hypertensive end-organ damage includes a thyroid problem.  Hyperlipidemia  This is a chronic problem. The current episode started more than 1 year ago. The problem is uncontrolled. Recent lipid tests were reviewed and are variable. Pertinent negatives include no chest pain or myalgias. Current antihyperlipidemic treatment includes statins. Compliance problems include adherence to diet and adherence to exercise.  Risk factors for coronary artery disease include dyslipidemia, hypertension and obesity.  Thyroid Problem  Presents for follow-up (hypothyroidism) visit. Patient reports no constipation, diaphoresis, diarrhea, heat intolerance, menstrual problem, palpitations or visual change. The symptoms have been stable. Her past medical history is significant for hyperlipidemia.  Migraines Chronic problem. Says she has some type of headache everyday. Her last migraine was 4  days ago imitrex not helping anymore. Patient has not been to a headache clinic. She has recently been seeing chiropractor which seems to help with the migraines.  Bipolar/ agrophobia Chronic problem. Patient is on Lamictal. Takes Klonopin when she goes out because she has panic attacks when she is around a lot of people. She does not have a psychiatrist here in [place]. Her last episode was very manic. Bladder leakage/stress incontinence  Started back in November. Happens daily. Has to wear panty liners daily. Patient says she doesn't feel it sometimes. Has trouble completely emptying bladder. Does C/O urgency. She  reports  Fever blisters Occur weekly lasting 6 days. Can have more than one at the same time. GERD  Patient use to be on zantac PRN.   * stepped in a hole in yard yesterday and twisted her right knee.   Review of Systems   Constitutional: Negative for diaphoresis.  HENT: Positive for congestion and sinus pressure. Negative for ear pain and sore throat.   Respiratory: Positive for cough.   Cardiovascular: Negative for chest pain and palpitations.  Gastrointestinal: Negative for constipation and diarrhea.  Endocrine: Negative for heat intolerance.  Genitourinary: Positive for urgency. Negative for dysuria, flank pain, hematuria, menstrual problem and pelvic pain.  Musculoskeletal: Negative for myalgias.  All other systems reviewed and are negative.      Objective:   Physical Exam  Constitutional: She is oriented to person, place, and time. She appears well-developed and well-nourished.  HENT:  Nose: Nose normal.  Mouth/Throat: Oropharynx is clear and moist.  Eyes: EOM are normal.  Neck: Trachea normal, normal range of motion and full passive range of motion without pain. Neck supple. No JVD present. Carotid bruit is not present. No thyromegaly present.  Cardiovascular: Normal rate, regular rhythm, normal heart sounds and intact distal pulses.  Exam reveals no gallop and no friction rub.   No murmur heard. Pulmonary/Chest: Effort normal. She has wheezes (exp failnt in lower lobes).  Deep dry cough  Abdominal: Soft. Bowel sounds are normal. She exhibits no distension and no mass. There is no tenderness.  Genitourinary: Vagina normal and uterus normal. No vaginal discharge found.  Genitourinary Comments: Cervix parous and pink No adnexal masses or tenderness  Musculoskeletal: Normal range of motion.  Mild right knee effusion with pain on flexion and extension No patella tenderness- all ligaments intact.  Lymphadenopathy:    She has no cervical adenopathy.  Neurological: She is alert and oriented to person, place, and time. She has normal reflexes.  Skin: Skin is warm and dry.  Psychiatric: She has a normal mood and affect. Her behavior is normal. Judgment and thought content normal.    BP (!) 144/96  (BP Location: Left Arm, Cuff Size: Large)   Pulse (!) 111   Temp 97.4 F (36.3 C) (Oral)   Ht '5\' 1"'  (1.549 m)   Wt 283 lb (128.4 kg)   BMI 53.47 kg/m        Assessment & Plan:  1. Annual physical exam - CBC with Differential/Platelet  2. Essential hypertension, benign Low sodium diet - losartan-hydrochlorothiazide (HYZAAR) 100-25 MG tablet; Take 1 tablet by mouth daily.  Dispense: 90 tablet; Refill: 0 - CMP14+EGFR  3. Intractable chronic migraine without aura and with status migrainosus  4. Gastroesophageal reflux disease, esophagitis presence not specified Avoid spicy foods Do not eat 2 hours prior to bedtime - ranitidine (ZANTAC) 150 MG tablet; Take 1 tablet (150 mg total) by mouth 2 (two) times daily.  Dispense: 90 tablet; Refill: 1  5. 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  Dispense: 90 tablet; Refill: 1 - Thyroid Panel With TSH  6. Agoraphobia with panic attacks Stress management - clonazePAM (KLONOPIN) 0.5 MG tablet; Take 1 tablet (0.5 mg total) by mouth 2 (two) times daily.  Dispense: 60 tablet; Refill: 2  7. Binge eating disorder  8. Bipolar 1 disorder, mixed, moderate (HCC) - lamoTRIgine (LAMICTAL) 100 MG tablet; Take 1 tablet (100 mg total) by mouth daily.  Dispense: 60  tablet; Refill: 5  9. Hyperlipidemia with target LDL less than 100 Low fat diet - pravastatin (PRAVACHOL) 40 MG tablet; TAKE 1 TABLET (40 MG TOTAL) BY MOUTH DAILY.  Dispense: 90 tablet; Refill: 1 - Lipid panel  10. Severe obesity (BMI >= 40) (HCC) Discussed diet and exercise for person with BMI >25 Will recheck weight in 3-6 months  11. Cough - benzonatate (TESSALON PERLES) 100 MG capsule; Take 1 capsule (100 mg total) by mouth 3 (three) times daily as needed for cough.  Dispense: 20 capsule; Refill: 2  12. Encounter for gynecological examination without abnormal  finding - IGP, Aptima HPV, rfx 16/18,45  13. Right knee strain Call if not improving RICE treatment discussed RTO if no better in 2 weeks  Labs pending Health maintenance reviewed Diet and exercise encouraged Continue all meds Follow up  In 3 months   [place], FNP

## 2016-10-25 NOTE — Addendum Note (Signed)
Addended by: Bennie PieriniMARTIN, MARY-MARGARET on: 10/25/2016 03:51 PM   Modules accepted: Orders

## 2016-10-26 LAB — CBC WITH DIFFERENTIAL/PLATELET
Basophils Absolute: 0.1 10*3/uL (ref 0.0–0.2)
Basos: 1 %
EOS (ABSOLUTE): 0.4 10*3/uL (ref 0.0–0.4)
EOS: 4 %
HEMATOCRIT: 39.9 % (ref 34.0–46.6)
HEMOGLOBIN: 14.1 g/dL (ref 11.1–15.9)
IMMATURE GRANULOCYTES: 0 %
Immature Grans (Abs): 0 10*3/uL (ref 0.0–0.1)
Lymphocytes Absolute: 3.4 10*3/uL — ABNORMAL HIGH (ref 0.7–3.1)
Lymphs: 34 %
MCH: 31.3 pg (ref 26.6–33.0)
MCHC: 35.3 g/dL (ref 31.5–35.7)
MCV: 89 fL (ref 79–97)
MONOCYTES: 7 %
MONOS ABS: 0.8 10*3/uL (ref 0.1–0.9)
NEUTROS PCT: 54 %
Neutrophils Absolute: 5.5 10*3/uL (ref 1.4–7.0)
Platelets: 318 10*3/uL (ref 150–379)
RBC: 4.5 x10E6/uL (ref 3.77–5.28)
RDW: 13.4 % (ref 12.3–15.4)
WBC: 10.1 10*3/uL (ref 3.4–10.8)

## 2016-10-26 LAB — CMP14+EGFR
A/G RATIO: 1.9 (ref 1.2–2.2)
ALBUMIN: 4.3 g/dL (ref 3.5–5.5)
ALT: 14 IU/L (ref 0–32)
AST: 18 IU/L (ref 0–40)
Alkaline Phosphatase: 78 IU/L (ref 39–117)
BILIRUBIN TOTAL: 0.4 mg/dL (ref 0.0–1.2)
BUN / CREAT RATIO: 9 (ref 9–23)
BUN: 6 mg/dL (ref 6–24)
CHLORIDE: 103 mmol/L (ref 96–106)
CO2: 21 mmol/L (ref 18–29)
Calcium: 9.3 mg/dL (ref 8.7–10.2)
Creatinine, Ser: 0.65 mg/dL (ref 0.57–1.00)
GFR calc non Af Amer: 108 mL/min/{1.73_m2} (ref >59)
GFR, EST AFRICAN AMERICAN: 124 mL/min/{1.73_m2} (ref >59)
Globulin, Total: 2.3 g/dL (ref 1.5–4.5)
Glucose: 87 mg/dL (ref 65–99)
POTASSIUM: 3.7 mmol/L (ref 3.5–5.2)
SODIUM: 142 mmol/L (ref 134–144)
TOTAL PROTEIN: 6.6 g/dL (ref 6.0–8.5)

## 2016-10-26 LAB — LIPID PANEL
Chol/HDL Ratio: 3.8 ratio units (ref 0.0–4.4)
Cholesterol, Total: 118 mg/dL (ref 100–199)
HDL: 31 mg/dL — ABNORMAL LOW (ref >39)
LDL Calculated: 57 mg/dL (ref 0–99)
Triglycerides: 150 mg/dL — ABNORMAL HIGH (ref 0–149)
VLDL Cholesterol Cal: 30 mg/dL (ref 5–40)

## 2016-10-26 LAB — THYROID PANEL WITH TSH
Free Thyroxine Index: 2.1 (ref 1.2–4.9)
T3 UPTAKE RATIO: 25 % (ref 24–39)
T4 TOTAL: 8.5 ug/dL (ref 4.5–12.0)
TSH: 2.18 u[IU]/mL (ref 0.450–4.500)

## 2016-10-27 LAB — IGP, APTIMA HPV, RFX 16/18,45
HPV APTIMA: NEGATIVE
PAP SMEAR COMMENT: 0

## 2016-11-08 ENCOUNTER — Encounter: Payer: Self-pay | Admitting: Family Medicine

## 2016-11-08 ENCOUNTER — Ambulatory Visit (INDEPENDENT_AMBULATORY_CARE_PROVIDER_SITE_OTHER): Payer: Medicaid Other | Admitting: Family Medicine

## 2016-11-08 VITALS — BP 130/80 | HR 90 | Temp 98.5°F | Ht 61.0 in | Wt 280.6 lb

## 2016-11-08 DIAGNOSIS — J0141 Acute recurrent pansinusitis: Secondary | ICD-10-CM

## 2016-11-08 DIAGNOSIS — B001 Herpesviral vesicular dermatitis: Secondary | ICD-10-CM

## 2016-11-08 MED ORDER — AZITHROMYCIN 250 MG PO TABS: ORAL_TABLET | ORAL | 0 refills | Status: DC

## 2016-11-08 MED ORDER — AMOXICILLIN-POT CLAVULANATE 875-125 MG PO TABS: 1.0000 | ORAL_TABLET | Freq: Two times a day (BID) | ORAL | 0 refills | Status: DC

## 2016-11-08 MED ORDER — VALACYCLOVIR HCL 1 G PO TABS: ORAL_TABLET | ORAL | 0 refills | Status: DC

## 2016-11-08 MED ORDER — METHYLPREDNISOLONE ACETATE 80 MG/ML IJ SUSP
80.0000 mg | Freq: Once | INTRAMUSCULAR | Status: AC
  Administered 2016-11-08: 80 mg via INTRAMUSCULAR

## 2016-11-08 NOTE — Patient Instructions (Addendum)
Great to meet you!   Sinusitis, Adult Sinusitis is soreness and inflammation of your sinuses. Sinuses are hollow spaces in the bones around your face. They are located:  Around your eyes.  In the middle of your forehead.  Behind your nose.  In your cheekbones. Your sinuses and nasal passages are lined with a stringy fluid (mucus). Mucus normally drains out of your sinuses. When your nasal tissues get inflamed or swollen, the mucus can get trapped or blocked so air cannot flow through your sinuses. This lets bacteria, viruses, and funguses grow, and that leads to infection. Follow these instructions at home: Medicines  Take, use, or apply over-the-counter and prescription medicines only as told by your doctor. These may include nasal sprays.  If you were prescribed an antibiotic medicine, take it as told by your doctor. Do not stop taking the antibiotic even if you start to feel better. Hydrate and Humidify  Drink enough water to keep your pee (urine) clear or pale yellow.  Use a cool mist humidifier to keep the humidity level in your home above 50%.  Breathe in steam for 10-15 minutes, 3-4 times a day or as told by your doctor. You can do this in the bathroom while a hot shower is running.  Try not to spend time in cool or dry air. Rest  Rest as much as possible.  Sleep with your head raised (elevated).  Make sure to get enough sleep each night. General instructions  Put a warm, moist washcloth on your face 3-4 times a day or as told by your doctor. This will help with discomfort.  Wash your hands often with soap and water. If there is no soap and water, use hand sanitizer.  Do not smoke. Avoid being around people who are smoking (secondhand smoke).  Keep all follow-up visits as told by your doctor. This is important. Contact a doctor if:  You have a fever.  Your symptoms get worse.  Your symptoms do not get better within 10 days. Get help right away if:  You  have a very bad headache.  You cannot stop throwing up (vomiting).  You have pain or swelling around your face or eyes.  You have trouble seeing.  You feel confused.  Your neck is stiff.  You have trouble breathing. This information is not intended to replace advice given to you by your health care provider. Make sure you discuss any questions you have with your health care provider. Document Released: 05/03/2008 Document Revised: 07/11/2016 Document Reviewed: 09/10/2015 Elsevier Interactive Patient Education  2017 [employer]vier Inc.  

## 2016-11-08 NOTE — Progress Notes (Addendum)
   HPI  Patient presents today here with concern for sinus infection.  Patient explains that over the last week she's had facial pain, cough, headaches, and dizziness. Things seem to be slowly getting worse. Cough is described as nagging nonproductive cough. She also has body aches and chills. She denies fever.  She is tolerating foods and fluids normally.  She states that she struggles with left-sided chronic sinusitis, however now she is having much more right-sided pain in usual. I had some bloody nasal discharge in the left nares, she's taking Flonase daily  PMH: Smoking status noted ROS: Per HPI  Objective: BP 130/80   Pulse 90   Temp 98.5 F (36.9 C) (Oral)   Ht 5\' 1"  (1.549 m)   Wt 280 lb 9.6 oz (127.3 kg)   BMI 53.02 kg/m  Gen: NAD, alert, cooperative with exam HEENT: NCAT, bilateral facial tenderness and bilateral maxillary sinuses and frontal sinuses, TMs normal bilaterally, oropharynx clear CV: RRR, good S1/S2, no murmur Resp: Nonlabored, wheezy cough, good air movement Ext: No edema, warm Neuro: Alert and oriented, No gross deficits  Assessment and plan:  # Acute recurrent pansinusitis Treat with Augmentin Discussed using nasal saline rinses instead of Flonase for a short time due to the nosebleeds IM Depo-Medrol with severe facial tenderness as well as wheezy cough RTC with any concerns   Herpes labialis Patient is requesting Valtrex for likely fever blister development. 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 DISCONTD: azithromycin (ZITHROMAX Z-PAK) 250 MG tablet    Sig: 2 tablets on day 1 and 1 tablet daily after that    Dispense:  6 tablet    Refill:  0  . amoxicillin-clavulanate (AUGMENTIN) 875-125 MG tablet    Sig: Take 1 tablet by mouth 2 (two) times  daily.    Dispense:  20 tablet    Refill:  0  . valACYclovir (VALTREX) 1000 MG tablet    Sig: Take 2 twice daily for 1 day for fever blister    Dispense:  12 tablet    Refill:  0    Murtis SinkSam Aliyah Abeyta, MD [facility] 11/08/2016, 2:50 PM

## 2016-11-16 ENCOUNTER — Encounter: Payer: Self-pay | Admitting: Family Medicine

## 2016-11-16 ENCOUNTER — Ambulatory Visit (INDEPENDENT_AMBULATORY_CARE_PROVIDER_SITE_OTHER): Payer: Medicaid Other

## 2016-11-16 ENCOUNTER — Ambulatory Visit (INDEPENDENT_AMBULATORY_CARE_PROVIDER_SITE_OTHER): Payer: Medicaid Other | Admitting: Family Medicine

## 2016-11-16 VITALS — BP 144/85 | HR 116 | Temp 99.3°F | Ht 61.0 in | Wt 275.0 lb

## 2016-11-16 DIAGNOSIS — J189 Pneumonia, unspecified organism: Secondary | ICD-10-CM

## 2016-11-16 DIAGNOSIS — J181 Lobar pneumonia, unspecified organism: Secondary | ICD-10-CM | POA: Diagnosis not present

## 2016-11-16 MED ORDER — ALBUTEROL SULFATE (2.5 MG/3ML) 0.083% IN NEBU: 2.5000 mg | INHALATION_SOLUTION | Freq: Once | RESPIRATORY_TRACT | Status: DC

## 2016-11-16 MED ORDER — LEVOFLOXACIN 500 MG PO TABS: 500.0000 mg | ORAL_TABLET | Freq: Every day | ORAL | 0 refills | Status: DC

## 2016-11-16 MED ORDER — ALBUTEROL SULFATE HFA 108 (90 BASE) MCG/ACT IN AERS: 2.0000 | INHALATION_SPRAY | Freq: Four times a day (QID) | RESPIRATORY_TRACT | 0 refills | Status: DC | PRN

## 2016-11-16 NOTE — Addendum Note (Signed)
Addended by: Lorelee CoverOSTOSKY, Hila Bolding C on: 11/16/2016 02:11 PM   Modules accepted: Orders

## 2016-11-16 NOTE — Patient Instructions (Signed)
Great to see you!  I have sent Levaquin to your pharmacy, you can stop Augmentin. Please call or come back if you have any worsening symptoms or new concerns.   Community-Acquired Pneumonia, Adult Pneumonia is an infection of the lungs. There are different types of pneumonia. One type can develop while a person is in a hospital. A different type, called community-acquired pneumonia, develops in people who are not, or have not recently been, in the hospital or other health care facility. What are the causes? Pneumonia may be caused by bacteria, viruses, or funguses. Community-acquired pneumonia is often caused by Streptococcus pneumonia bacteria. These bacteria are often passed from one person to another by breathing in droplets from the cough or sneeze of an infected person. What increases the risk? The condition is more likely to develop in:  People who havechronic diseases, such as chronic obstructive pulmonary disease (COPD), asthma, congestive heart failure, cystic fibrosis, diabetes, or kidney disease.  People who haveearly-stage or late-stage HIV.  People who havesickle cell disease.  People who havehad their spleen removed (splenectomy).  People who havepoor Administratordental hygiene.  People who havemedical conditions that increase the risk of breathing in (aspirating) secretions their own mouth and nose.  People who havea weakened immune system (immunocompromised).  People who smoke.  People whotravel to areas where pneumonia-causing germs commonly exist.  People whoare around animal habitats or animals that have pneumonia-causing germs, including birds, bats, rabbits, cats, and farm animals. What are the signs or symptoms? Symptoms of this condition include:  Adry cough.  A wet (productive) cough.  Fever.  Sweating.  Chest pain, especially when breathing deeply or coughing.  Rapid breathing or difficulty breathing.  Shortness of breath.  Shaking  chills.  Fatigue.  Muscle aches. How is this diagnosed? Your health care provider will take a medical history and perform a physical exam. You may also have other tests, including:  Imaging studies of your chest, including X-rays.  Tests to check your blood oxygen level and other blood gases.  Other tests on blood, mucus (sputum), fluid around your lungs (pleural fluid), and urine. If your pneumonia is severe, other tests may be done to identify the specific cause of your illness. How is this treated? The type of treatment that you receive depends on many factors, such as the cause of your pneumonia, the medicines you take, and other medical conditions that you have. For most adults, treatment and recovery from pneumonia may occur at home. In some cases, treatment must happen in a hospital. Treatment may include:  Antibiotic medicines, if the pneumonia was caused by bacteria.  Antiviral medicines, if the pneumonia was caused by a virus.  Medicines that are given by mouth or through an IV tube.  Oxygen.  Respiratory therapy. Although rare, treating severe pneumonia may include:  Mechanical ventilation. This is done if you are not breathing well on your own and you cannot maintain a safe blood oxygen level.  Thoracentesis. This procedureremoves fluid around one lung or both lungs to help you breathe better. Follow these instructions at home:  Take over-the-counter and prescription medicines only as told by your health care provider.  Only takecough medicine if you are losing sleep. Understand that cough medicine can prevent your body's natural ability to remove mucus from your lungs.  If you were prescribed an antibiotic medicine, take it as told by your health care provider. Do not stop taking the antibiotic even if you start to feel better.  Sleep in a  semi-upright position at night. Try sleeping in a reclining chair, or place a few pillows under your head.  Do not use  tobacco products, including cigarettes, chewing tobacco, and e-cigarettes. If you need help quitting, ask your health care provider.  Drink enough water to keep your urine clear or pale yellow. This will help to thin out mucus secretions in your lungs. How is this prevented? There are ways that you can decrease your risk of developing community-acquired pneumonia. Consider getting a pneumococcal vaccine if:  You are older than 45 years of age.  You are older than 45 years of age and are undergoing cancer treatment, have chronic lung disease, or have other medical conditions that affect your immune system. Ask your health care provider if this applies to you. There are different types and schedules of pneumococcal vaccines. Ask your health care provider which vaccination option is best for you. You may also prevent community-acquired pneumonia if you take these actions:  Get an influenza vaccine every year. Ask your health care provider which type of influenza vaccine is best for you.  Go to the dentist on a regular basis.  Wash your hands often. Use hand sanitizer if soap and water are not available. Contact a health care provider if:  You have a fever.  You are losing sleep because you cannot control your cough with cough medicine. Get help right away if:  You have worsening shortness of breath.  You have increased chest pain.  Your sickness becomes worse, especially if you are an older adult or have a weakened immune system.  You cough up blood. This information is not intended to replace advice given to you by your health care provider. Make sure you discuss any questions you have with your health care provider. Document Released: 11/15/2005 Document Revised: 03/25/2016 Document Reviewed: 03/12/2015 Elsevier Interactive Patient Education  2017 [employer]

## 2016-11-16 NOTE — Progress Notes (Addendum)
   HPI  Patient presents today here with cough.  Patient was seen about 9 days ago for sinusitis. Her sinus pressure is improved involvement, however 4-5 days ago she developed severe worsening cough. The cough is been steadily worsening since onset. She also has dyspnea, chest pain, and subjective fever.  Patient is tolerating food and fluids normally.   PMH: Smoking status noted ROS: Per HPI  Objective: BP (!) 144/85   Pulse (!) 116   Temp 99.3 F (37.4 C) (Oral)   Ht 5\' 1"  (1.549 m)   Wt 275 lb (124.7 kg)   BMI 51.96 kg/m  Gen: NAD, alert, cooperative with exam HEENT: NCAT CV: RRR, good S1/S2, no murmur Resp: Very wheezy cough, nonlabored, bilateral lower lung fields with coarse breath sounds left greater than right Ext: No edema, warm Neuro: Alert and oriented, No gross deficits  After nebulizer: Air movement improved, bilateral bases with coarse breath sounds. Cough improved   Assessment and plan:  # Community-acquired pneumonia Clinical diagnosis, x-ray pending Patient with tachycardia, temperature 99.3, and severe cough with coarse breath sounds bilateral bases Chest x-ray to confirm - pending Change Augmentin to Levaquin Return to clinic as needed, very low threshold for return Nebulizer improved respirations, given albuterol HFA.   CXR- Possible L lower lobe infiltrate, awaiting official read, discussed.    Murtis SinkSam Bradshaw, MD [facility] 11/16/2016, 1:13 PM

## 2016-11-19 ENCOUNTER — Ambulatory Visit (INDEPENDENT_AMBULATORY_CARE_PROVIDER_SITE_OTHER): Payer: Medicaid Other | Admitting: Family Medicine

## 2016-11-19 ENCOUNTER — Encounter: Payer: Self-pay | Admitting: Family Medicine

## 2016-11-19 VITALS — BP 139/82 | HR 106 | Temp 98.7°F | Ht 61.0 in | Wt 275.0 lb

## 2016-11-19 DIAGNOSIS — R300 Dysuria: Secondary | ICD-10-CM | POA: Diagnosis not present

## 2016-11-19 DIAGNOSIS — J40 Bronchitis, not specified as acute or chronic: Secondary | ICD-10-CM | POA: Diagnosis not present

## 2016-11-19 DIAGNOSIS — J329 Chronic sinusitis, unspecified: Secondary | ICD-10-CM | POA: Diagnosis not present

## 2016-11-19 LAB — URINALYSIS
Bilirubin, UA: NEGATIVE
Glucose, UA: NEGATIVE
KETONES UA: NEGATIVE
NITRITE UA: NEGATIVE
SPEC GRAV UA: 1.025 (ref 1.005–1.030)
UUROB: 0.2 mg/dL (ref 0.2–1.0)
pH, UA: 6 (ref 5.0–7.5)

## 2016-11-19 LAB — URINALYSIS, MICROSCOPIC ONLY: Renal Epithel, UA: NONE SEEN /hpf

## 2016-11-19 MED ORDER — MOXIFLOXACIN HCL 400 MG PO TABS: 400.0000 mg | ORAL_TABLET | Freq: Every day | ORAL | 0 refills | Status: DC

## 2016-11-19 MED ORDER — CEFUROXIME AXETIL 500 MG PO TABS: 500.0000 mg | ORAL_TABLET | Freq: Two times a day (BID) | ORAL | 0 refills | Status: AC

## 2016-11-19 MED ORDER — BETAMETHASONE SOD PHOS & ACET 6 (3-3) MG/ML IJ SUSP
6.0000 mg | Freq: Once | INTRAMUSCULAR | Status: AC
  Administered 2016-11-19: 6 mg via INTRAMUSCULAR

## 2016-11-19 MED ORDER — HYDROCODONE-HOMATROPINE 5-1.5 MG/5ML PO SYRP: 5.0000 mL | ORAL_SOLUTION | Freq: Four times a day (QID) | ORAL | 0 refills | Status: DC | PRN

## 2016-11-19 MED ORDER — NITROFURANTOIN MONOHYD MACRO 100 MG PO CAPS: 100.0000 mg | ORAL_CAPSULE | Freq: Two times a day (BID) | ORAL | 0 refills | Status: DC

## 2016-11-19 NOTE — Progress Notes (Signed)
Subjective:  Patient ID: Dawn Craig, female    DOB: 08/11/1971  Age: 45 y.o. MRN: [id number]  CC: Hematuria (pt here today c/o hematuria and dysuria)   HPI Dawn Craig presents for Follow-up of sinuses. She is also confused because someone told her 3 days ago that she had pneumonia. However, there was no pneumonia/infiltrate on the my chart x-ray report. She is coughing profusely still and has facial pressure across her nose. She has pain in her ears as well. She has not had fever or shortness of breath. She does have some blood noted on the pad she is wearing. This is because she tends to leak when she coughs. When she removes the pad there is a small dot of blood at the superior portion of the pad.   History Dawn Craig has a past medical history of Anxiety; Bipolar 1 disorder (HCC); GERD (gastroesophageal reflux disease); H/O sinus surgery; Hyperlipidemia; Hypertension; and Thyroid disease.   She has a past surgical history that includes Cesarean section.   Her family history includes Cancer in her mother; Hyperlipidemia in her father; Hypertension in her father and mother.She reports that she has never smoked. She has never used smokeless tobacco. She reports that she does not drink alcohol or use drugs.    ROS Review of Systems  Constitutional: Negative for activity change, appetite change and fever.  HENT: Negative for congestion, rhinorrhea and sore throat.   Eyes: Negative for visual disturbance.  Respiratory: Negative for cough and shortness of breath.   Cardiovascular: Negative for chest pain and palpitations.  Gastrointestinal: Negative for abdominal pain, diarrhea and nausea.  Genitourinary: Negative for dysuria.  Musculoskeletal: Negative for arthralgias and myalgias.    Objective:  BP 139/82   Pulse (!) 106   Temp 98.7 F (37.1 C) (Oral)   Ht 5\' 1"  (1.549 m)   Wt 275 lb (124.7 kg)   BMI 51.96 kg/m   BP Readings from Last 3 Encounters:  12/01/16 136/84    11/19/16 139/82  11/16/16 (!) 144/85    Wt Readings from Last 3 Encounters:  12/01/16 277 lb (125.6 kg)  11/19/16 275 lb (124.7 kg)  11/16/16 275 lb (124.7 kg)     Physical Exam  Constitutional: She is oriented to person, place, and time. She appears well-developed and well-nourished. No distress.  HENT:  Head: Normocephalic and atraumatic.  Eyes: Conjunctivae are normal. Pupils are equal, round, and reactive to light.  Neck: Normal range of motion. Neck supple. No thyromegaly present.  Cardiovascular: Normal rate, regular rhythm and normal heart sounds.   No murmur heard. Pulmonary/Chest: Effort normal and breath sounds normal. No respiratory distress. She has no wheezes. She has no rales.  Abdominal: Soft. Bowel sounds are normal. She exhibits no distension. There is no tenderness.  Musculoskeletal: Normal range of motion.  Lymphadenopathy:    She has no cervical adenopathy.  Neurological: She is alert and oriented to person, place, and time.  Skin: Skin is warm and dry.  Psychiatric: She has a normal mood and affect. Her behavior is normal. Judgment and thought content normal.     Lab Results  Component Value Date   WBC 10.1 10/25/2016   HGB 14.1 06/13/2013   HCT 39.9 10/25/2016   PLT 318 10/25/2016   GLUCOSE 87 10/25/2016   CHOL 118 10/25/2016   TRIG 150 (H) 10/25/2016   HDL 31 (L) 10/25/2016   LDLCALC 57 10/25/2016   ALT 14 10/25/2016   AST 18 10/25/2016   NA  142 10/25/2016   K 3.7 10/25/2016   CL 103 10/25/2016   CREATININE 0.65 10/25/2016   BUN 6 10/25/2016   CO2 21 10/25/2016   TSH 2.180 10/25/2016    Patient was never admitted.  Assessment & Plan:   Dawn Craig was seen today for hematuria.  Diagnoses and all orders for this visit:  Dysuria -     Urinalysis -     Urine Microscopic -     Urine culture  Sinobronchitis -     betamethasone acetate-betamethasone sodium phosphate (CELESTONE) injection 6 mg; Inject 1 mL (6 mg total) into the muscle  once.  Other orders -     Discontinue: moxifloxacin (AVELOX) 400 MG tablet; Take 1 tablet (400 mg total) by mouth daily. Take all of these, for infection -     Discontinue: nitrofurantoin, macrocrystal-monohydrate, (MACROBID) 100 MG capsule; Take 1 capsule (100 mg total) by mouth 2 (two) times daily. -     HYDROcodone-homatropine (HYCODAN) 5-1.5 MG/5ML syrup; Take 5 mLs by mouth every 6 (six) hours as needed for cough. -     cefUROXime (CEFTIN) 500 MG tablet; Take 1 tablet (500 mg total) by mouth 2 (two) times daily with a meal.   I have discontinued Ms. Lorusso's benzonatate, levofloxacin, and moxifloxacin. I am also having her start on HYDROcodone-homatropine and cefUROXime. Additionally, I am having her maintain her Vitamin D, betamethasone dipropionate, triamcinolone cream, loratadine, fluticasone, clonazePAM, lamoTRIgine, ranitidine, pravastatin, levothyroxine, naproxen, valACYclovir, and albuterol. We administered betamethasone acetate-betamethasone sodium phosphate. We will continue to administer albuterol.  Meds ordered this encounter  Medications  . DISCONTD: moxifloxacin (AVELOX) 400 MG tablet    Sig: Take 1 tablet (400 mg total) by mouth daily. Take all of these, for infection    Dispense:  7 tablet    Refill:  0  . DISCONTD: nitrofurantoin, macrocrystal-monohydrate, (MACROBID) 100 MG capsule    Sig: Take 1 capsule (100 mg total) by mouth 2 (two) times daily.    Dispense:  14 capsule    Refill:  0  . HYDROcodone-homatropine (HYCODAN) 5-1.5 MG/5ML syrup    Sig: Take 5 mLs by mouth every 6 (six) hours as needed for cough.    Dispense:  120 mL    Refill:  0  . betamethasone acetate-betamethasone sodium phosphate (CELESTONE) injection 6 mg  . cefUROXime (CEFTIN) 500 MG tablet    Sig: Take 1 tablet (500 mg total) by mouth 2 (two) times daily with a meal.    Dispense:  20 tablet    Refill:  0     Follow-up: Return if symptoms worsen or fail to improve.  Mechele ClaudeWarren Shanon Seawright, M.D.

## 2016-11-21 LAB — URINE CULTURE

## 2016-11-24 ENCOUNTER — Telehealth: Payer: Self-pay | Admitting: Family Medicine

## 2016-11-26 ENCOUNTER — Other Ambulatory Visit: Payer: Self-pay | Admitting: Nurse Practitioner

## 2016-11-30 ENCOUNTER — Other Ambulatory Visit: Payer: Self-pay | Admitting: Nurse Practitioner

## 2016-11-30 DIAGNOSIS — N393 Stress incontinence (female) (male): Secondary | ICD-10-CM

## 2016-12-01 ENCOUNTER — Encounter: Payer: Self-pay | Admitting: Nurse Practitioner

## 2016-12-01 ENCOUNTER — Ambulatory Visit (INDEPENDENT_AMBULATORY_CARE_PROVIDER_SITE_OTHER): Payer: Medicaid Other | Admitting: Nurse Practitioner

## 2016-12-01 ENCOUNTER — Ambulatory Visit: Payer: Medicaid Other

## 2016-12-01 VITALS — BP 136/84 | HR 114 | Temp 98.0°F | Ht 61.0 in | Wt 277.0 lb

## 2016-12-01 DIAGNOSIS — Z3042 Encounter for surveillance of injectable contraceptive: Secondary | ICD-10-CM

## 2016-12-01 DIAGNOSIS — H8113 Benign paroxysmal vertigo, bilateral: Secondary | ICD-10-CM

## 2016-12-01 DIAGNOSIS — H8110 Benign paroxysmal vertigo, unspecified ear: Secondary | ICD-10-CM

## 2016-12-01 DIAGNOSIS — Z87440 Personal history of urinary (tract) infections: Secondary | ICD-10-CM

## 2016-12-01 LAB — URINALYSIS, COMPLETE
Bilirubin, UA: NEGATIVE
Glucose, UA: NEGATIVE
KETONES UA: NEGATIVE
LEUKOCYTES UA: NEGATIVE
NITRITE UA: NEGATIVE
PH UA: 7.5 (ref 5.0–7.5)
Specific Gravity, UA: 1.02 (ref 1.005–1.030)
Urobilinogen, Ur: 0.2 mg/dL (ref 0.2–1.0)

## 2016-12-01 LAB — MICROSCOPIC EXAMINATION
BACTERIA UA: NONE SEEN
RENAL EPITHEL UA: NONE SEEN /HPF
WBC, UA: NONE SEEN /hpf (ref 0–?)

## 2016-12-01 MED ORDER — PREDNISONE 10 MG (21) PO TBPK: ORAL_TABLET | ORAL | 0 refills | Status: DC

## 2016-12-01 MED ORDER — MECLIZINE HCL 25 MG PO TABS: 25.0000 mg | ORAL_TABLET | Freq: Three times a day (TID) | ORAL | 0 refills | Status: DC | PRN

## 2016-12-01 NOTE — Patient Instructions (Signed)
Dizziness Dizziness is a common problem. It is a feeling of unsteadiness or light-headedness. You may feel like you are about to faint. Dizziness can lead to injury if you stumble or fall. Anyone can become dizzy, but dizziness is more common in older adults. This condition can be caused by a number of things, including medicines, dehydration, or illness. Follow these instructions at home: Taking these steps may help with your condition: Eating and drinking   Drink enough fluid to keep your urine clear or pale yellow. This helps to keep you from becoming dehydrated. Try to drink more clear fluids, such as water.  Do not drink alcohol.  Limit your caffeine intake if directed by your health care provider.  Limit your salt intake if directed by your health care provider. Activity   Avoid making quick movements.  Rise slowly from chairs and steady yourself until you feel okay.  In the morning, first sit up on the side of the bed. When you feel okay, stand slowly while you hold onto something until you know that your balance is fine.  Move your legs often if you need to stand in one place for a long time. Tighten and relax your muscles in your legs while you are standing.  Do not drive or operate heavy machinery if you feel dizzy.  Avoid bending down if you feel dizzy. Place items in your home so that they are easy for you to reach without leaning over. Lifestyle   Do not use any tobacco products, including cigarettes, chewing tobacco, or electronic cigarettes. If you need help quitting, ask your health care provider.  Try to reduce your stress level, such as with yoga or meditation. Talk with your health care provider if you need help. General instructions   Watch your dizziness for any changes.  Take medicines only as directed by your health care provider. Talk with your health care provider if you think that your dizziness is caused by a medicine that you are taking.  Tell a friend  or a family member that you are feeling dizzy. If he or she notices any changes in your behavior, have this person call your health care provider.  Keep all follow-up visits as directed by your health care provider. This is important. Contact a health care provider if:  Your dizziness does not go away.  Your dizziness or light-headedness gets worse.  You feel nauseous.  You have reduced hearing.  You have new symptoms.  You are unsteady on your feet or you feel like the room is spinning. Get help right away if:  You vomit or have diarrhea and are unable to eat or drink anything.  You have problems talking, walking, swallowing, or using your arms, hands, or legs.  You feel generally weak.  You are not thinking clearly or you have trouble forming sentences. It may take a friend or family member to notice this.  You have chest pain, abdominal pain, shortness of breath, or sweating.  Your vision changes.  You notice any bleeding.  You have a headache.  You have neck pain or a stiff neck.  You have a fever. This information is not intended to replace advice given to you by your health care provider. Make sure you discuss any questions you have with your health care provider. Document Released: 05/11/2001 Document Revised: 04/22/2016 Document Reviewed: 11/11/2014 Elsevier Interactive Patient Education  2017 [employer]  

## 2016-12-01 NOTE — Progress Notes (Signed)
   Subjective:    Patient ID: Dawn Craig, female    DOB: 02-09-1971, 46 y.o.   MRN: [id number]  HPI Patient comes in today with several complaints: -Patient saves that she is having pain in her ears- has been going on for several weeks. Has some associated dizziness. The dizziness is intermittent but is occurring every day. - Had UTI several weeks ago- completed antibiotic-  Now has some cloudy urine with slight perineal burning and itching.    Review of Systems  Constitutional: Negative for chills and fever.  HENT: Positive for ear pain.   Respiratory: Negative.   Cardiovascular: Negative.   Gastrointestinal: Negative.   Genitourinary: Negative.   Musculoskeletal: Negative.   Neurological: Positive for dizziness.  Psychiatric/Behavioral: Negative.   All other systems reviewed and are negative.      Objective:   Physical Exam  Constitutional: She is oriented to person, place, and time. She appears well-developed and well-nourished.  Cardiovascular: Normal rate, regular rhythm and normal heart sounds.   Pulmonary/Chest: Effort normal and breath sounds normal.  Neurological: She is oriented to person, place, and time. She has normal reflexes. No cranial nerve deficit.  Skin: Skin is warm.  Psychiatric: She has a normal mood and affect. Her behavior is normal. Judgment and thought content normal.   BP (!) 150/103   Pulse (!) 114   Temp 98 F (36.7 C) (Oral)   Ht 5\' 1"  (1.549 m)   Wt 277 lb (125.6 kg)   BMI 52.34 kg/m   Urine clear    Assessment & Plan:  1. Recent urinary tract infection Force fluids - Urinalysis, Complete  2. Benign paroxysmal positional vertigo, unspecified laterality Force fluids RTO if does n ot reslve - meclizine (ANTIVERT) 25 MG tablet; Take 1 tablet (25 mg total) by mouth 3 (three) times daily as needed for dizziness.  Dispense: 30 tablet; Refill: 0 - predniSONE (STERAPRED UNI-PAK 21 TAB) 10 MG (21) TBPK tablet; As directed x 6 days   Dispense: 21 tablet; Refill: 0  Mary-Margaret Daphine DeutscherMartin, FNP

## 2016-12-04 ENCOUNTER — Other Ambulatory Visit: Payer: Self-pay | Admitting: Nurse Practitioner

## 2016-12-04 DIAGNOSIS — E032 Hypothyroidism due to medicaments and other exogenous substances: Secondary | ICD-10-CM

## 2016-12-07 ENCOUNTER — Telehealth: Payer: Self-pay

## 2016-12-07 ENCOUNTER — Other Ambulatory Visit: Payer: Self-pay | Admitting: Nurse Practitioner

## 2016-12-07 MED ORDER — LOSARTAN POTASSIUM-HCTZ 100-25 MG PO TABS: 1.0000 | ORAL_TABLET | Freq: Every day | ORAL | 1 refills | Status: DC

## 2016-12-09 NOTE — Telephone Encounter (Signed)
x

## 2016-12-14 ENCOUNTER — Telehealth: Payer: Self-pay

## 2016-12-14 ENCOUNTER — Other Ambulatory Visit: Payer: Self-pay | Admitting: Nurse Practitioner

## 2016-12-14 NOTE — Telephone Encounter (Signed)
Received by pharmacy.

## 2016-12-14 NOTE — Telephone Encounter (Signed)
Was changed to hyzaar on 12/07/16-please chesk to make sure harmacy git change

## 2016-12-21 ENCOUNTER — Telehealth: Payer: Self-pay | Admitting: Nurse Practitioner

## 2016-12-21 ENCOUNTER — Other Ambulatory Visit: Payer: Self-pay | Admitting: Nurse Practitioner

## 2016-12-21 MED ORDER — LOSARTAN POTASSIUM 100 MG PO TABS: 100.0000 mg | ORAL_TABLET | Freq: Every day | ORAL | 5 refills | Status: DC

## 2016-12-21 MED ORDER — HYDROCHLOROTHIAZIDE 25 MG PO TABS: 25.0000 mg | ORAL_TABLET | Freq: Every day | ORAL | 5 refills | Status: DC

## 2016-12-21 NOTE — Progress Notes (Signed)
Patient notified that separated hyzaar into losartan and hctz.

## 2016-12-22 ENCOUNTER — Telehealth: Payer: Self-pay | Admitting: Nurse Practitioner

## 2016-12-22 NOTE — Telephone Encounter (Signed)
Patient states that losartan still needs a prior auth. Patient states she really does not want to switch medication since it took her awhile to find one that worked well with her and not to have any side effects and would rather wait for prior auth. Patient took last pill this morning. Aware MMM was not here and we would get back to her in the morning.  Please advise.

## 2016-12-23 NOTE — Telephone Encounter (Signed)
Eunice BlaseDebbie has done prior auth and has been waiting to hear back- sh ewil call patient and let her know.

## 2017-01-05 ENCOUNTER — Other Ambulatory Visit: Payer: Self-pay | Admitting: Nurse Practitioner

## 2017-01-23 ENCOUNTER — Other Ambulatory Visit: Payer: Self-pay | Admitting: Nurse Practitioner

## 2017-01-23 DIAGNOSIS — S86911A Strain of unspecified muscle(s) and tendon(s) at lower leg level, right leg, initial encounter: Secondary | ICD-10-CM

## 2017-01-26 ENCOUNTER — Ambulatory Visit (INDEPENDENT_AMBULATORY_CARE_PROVIDER_SITE_OTHER): Payer: Medicaid Other | Admitting: Nurse Practitioner

## 2017-01-26 ENCOUNTER — Encounter: Payer: Self-pay | Admitting: Nurse Practitioner

## 2017-01-26 VITALS — BP 143/98 | HR 109 | Temp 98.5°F | Ht 61.0 in | Wt 284.0 lb

## 2017-01-26 DIAGNOSIS — K219 Gastro-esophageal reflux disease without esophagitis: Secondary | ICD-10-CM | POA: Diagnosis not present

## 2017-01-26 DIAGNOSIS — B009 Herpesviral infection, unspecified: Secondary | ICD-10-CM | POA: Diagnosis not present

## 2017-01-26 DIAGNOSIS — F4001 Agoraphobia with panic disorder: Secondary | ICD-10-CM | POA: Diagnosis not present

## 2017-01-26 DIAGNOSIS — I10 Essential (primary) hypertension: Secondary | ICD-10-CM | POA: Diagnosis not present

## 2017-01-26 DIAGNOSIS — E785 Hyperlipidemia, unspecified: Secondary | ICD-10-CM | POA: Diagnosis not present

## 2017-01-26 DIAGNOSIS — E032 Hypothyroidism due to medicaments and other exogenous substances: Secondary | ICD-10-CM | POA: Diagnosis not present

## 2017-01-26 DIAGNOSIS — G43711 Chronic migraine without aura, intractable, with status migrainosus: Secondary | ICD-10-CM

## 2017-01-26 DIAGNOSIS — F3162 Bipolar disorder, current episode mixed, moderate: Secondary | ICD-10-CM

## 2017-01-26 MED ORDER — CLONAZEPAM 0.5 MG PO TABS: 0.5000 mg | ORAL_TABLET | Freq: Two times a day (BID) | ORAL | 2 refills | Status: DC

## 2017-01-26 NOTE — Progress Notes (Signed)
Subjective:    Patient ID: Dawn Craig, female    DOB: 1971-05-16, 46 y.o.   MRN: [id number]  Patient here today for follow up of chronic medical problems. NO complaints today  Outpatient Encounter Prescriptions as of 04/15/2016  Medication Sig  . benzonatate (TESSALON PERLES) 100 MG capsule Take 1 capsule (100 mg total) by mouth 3 (three) times daily as needed for cough.  . betamethasone dipropionate (DIPROLENE) 0.05 % cream Apply topically 2 (two) times daily.  . Cholecalciferol (VITAMIN D) 2000 UNITS CAPS Take by mouth.  . clonazePAM (KLONOPIN) 0.5 MG tablet TAKE 1 TABLET BY MOUTH TWICE A DAY  . fluticasone (FLONASE) 50 MCG/ACT nasal spray INSTILL 1 SPRAY INTO EACH NOSTRIL 2 TIMES DAILY  . lamoTRIgine (LAMICTAL) 100 MG tablet Take 1 tablet (100 mg total) by mouth daily.  Marland Kitchen levothyroxine (SYNTHROID, LEVOTHROID) 88 MCG tablet TAKE 1 TABLET (88 MCG TOTAL) BY MOUTH DAILY.  Marland Kitchen loratadine (CLARITIN) 10 MG tablet TAKE 1 TABLET (10 MG TOTAL) BY MOUTH DAILY.  Marland Kitchen losartan-hydrochlorothiazide (HYZAAR) 100-25 MG tablet Take 1 tablet by mouth daily.  . mirabegron ER (MYRBETRIQ) 50 MG TB24 tablet Take 1 tablet (50 mg total) by mouth daily.  . pravastatin (PRAVACHOL) 40 MG tablet TAKE 1 TABLET (40 MG TOTAL) BY MOUTH DAILY.  . ranitidine (ZANTAC) 150 MG tablet Take 1 tablet (150 mg total) by mouth 2 (two) times daily.  . SUMAtriptan (IMITREX) 100 MG tablet Take 1 tablet (100 mg total) by mouth every 2 (two) hours as needed for migraine.  . triamcinolone cream (KENALOG) 0.1 % Apply 1 application topically 2 (two) times daily.  . valACYclovir (VALTREX) 500 MG tablet TAKE 1 TABLET (500 MG TOTAL) BY MOUTH 2 (TWO) TIMES DAILY.          Hypertension  This is a chronic problem. The current episode started more than 1 year ago. The problem is unchanged. The problem is controlled. Pertinent negatives include no chest pain or palpitations. Risk factors for coronary artery disease include dyslipidemia, obesity  and sedentary lifestyle. Past treatments include diuretics and angiotensin blockers. The current treatment provides mild improvement. Compliance problems include diet and exercise.  Identifiable causes of hypertension include a thyroid problem.  Hyperlipidemia  This is a chronic problem. The current episode started more than 1 year ago. The problem is uncontrolled. Recent lipid tests were reviewed and are variable. Pertinent negatives include no chest pain or myalgias. Current antihyperlipidemic treatment includes statins. Compliance problems include adherence to diet and adherence to exercise.  Risk factors for coronary artery disease include dyslipidemia, hypertension and obesity.  Thyroid Problem  Presents for follow-up (hypothyroidism) visit. Patient reports no constipation, diaphoresis, diarrhea, heat intolerance, menstrual problem, palpitations or visual change. The symptoms have been stable. Her past medical history is significant for hyperlipidemia.  Migraines Chronic problem. Says she has some type of headache everyday. Her last migraine was 4  days ago imitrex not helping anymore. Patient has not been to a headache clinic. She has recently been seeing chiropractor which seems to help with the migraines.  Bipolar/ agrophobia Chronic problem. Patient is on Lamictal. Takes Klonopin when she goes out because she has panic attacks when she is around a lot of people. She does not have a psychiatrist here in [place]. Her last episode was very manic. Bladder leakage/stress incontinence  Started back in November. Happens daily. Has to wear panty liners daily. Patient says she doesn't feel it sometimes. Has trouble completely emptying bladder. Does C/O urgency. She reports  Fever blisters Occur weekly lasting 6 days. Can have more than one at the same time. GERD  Patient use to be on zantac PRN.      Review of Systems  Constitutional: Negative for diaphoresis.  HENT: Positive for congestion and sinus  pressure. Negative for ear pain and sore throat.   Respiratory: Positive for cough.   Cardiovascular: Negative for chest pain and palpitations.  Gastrointestinal: Negative for constipation and diarrhea.  Endocrine: Negative for heat intolerance.  Genitourinary: Positive for urgency. Negative for dysuria, flank pain, hematuria, menstrual problem and pelvic pain.  Musculoskeletal: Negative for myalgias.  All other systems reviewed and are negative.      Objective:   Physical Exam  Constitutional: She is oriented to person, place, and time. She appears well-developed and well-nourished.  HENT:  Nose: Nose normal.  Mouth/Throat: Oropharynx is clear and moist.  Eyes: EOM are normal.  Neck: Trachea normal, normal range of motion and full passive range of motion without pain. Neck supple. No JVD present. Carotid bruit is not present. No thyromegaly present.  Cardiovascular: Normal rate, regular rhythm, normal heart sounds and intact distal pulses.  Exam reveals no gallop and no friction rub.   No murmur heard. Pulmonary/Chest: Effort normal. She has wheezes (exp failnt in lower lobes).  Deep dry cough  Abdominal: Soft. Bowel sounds are normal. She exhibits no distension and no mass. There is no tenderness.  Musculoskeletal: Normal range of motion.  Lymphadenopathy:    She has no cervical adenopathy.  Neurological: She is alert and oriented to person, place, and time. She has normal reflexes.  Skin: Skin is warm and dry.  Psychiatric: She has a normal mood and affect. Her behavior is normal. Judgment and thought content normal.    BP (!) 143/98   Pulse (!) 109   Temp 98.5 F (36.9 C) (Oral)   Ht '5\' 1"'  (1.549 m)   Wt 284 lb (128.8 kg)   BMI 53.66 kg/m         Assessment & Plan:   1. Essential hypertension, benign   2. Agoraphobia with panic attacks   3. Intractable chronic migraine without aura and with status migrainosus   4. Gastroesophageal reflux disease, esophagitis  presence not specified   5. 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 Bipolar 1 disorder, mixed, moderate (HCC)   7. HSV-1 (herpes simplex virus 1) infection   8. Hyperlipidemia with target LDL less than 100   9. Severe obesity (BMI >= 40) (HCC)    Labs pending Diet and exercise encouraged Follow up in 3 months  Orders Placed This Encounter  Procedures  . CMP14+EGFR  . Lipid panel  . Thyroid Panel With TSH  . Amb Referral to Bariatric Surgery    Referral Priority:   Routine    Referral Type:   Consultation    Number of Visits Requested:   1   Meds ordered this encounter  Medications  . clonazePAM (KLONOPIN) 0.5 MG tablet    Sig: Take 1 tablet (0.5 mg total) by mouth 2 (two) times daily.    Dispense:  60 tablet    Refill:  2    Not to exceed 4 additional fills before 04/24/2016    Order Specific Question:   Supervising Provider    Answer:   Eustaquio Maize [[id number]]   Mary-Margaret Hassell Done, FNP

## 2017-01-26 NOTE — Patient Instructions (Signed)
Diet Following Bariatric Surgery The bariatric diet is designed to provide fluids and nourishment while promoting weight loss and healing after bariatric surgery. The diet is divided into 3 stages. Progress to each stage of the diet with your health care provider's approval. What do I need to know about my diet following bariatric surgery? Your surgeon may have individual guidelines for you about specific foods or the progression of your diet. Follow your surgeon's guidelines. You will follow these general guidelines during all stages of your diet:  Eat at set times.  Allow 30-45 minutes for each meal.  Take small bites. Chew your food until it is almost a liquid before swallowing it. Try setting down your utensils between bites to help yourself eat slower or make an "eat slowly" reminder sign.  Do not drink liquids for 30 minutes before meals and for 30 minutes after meals.  Drink between meals.  Stop eating when you are full. If you feel tightness or pressure in your chest, that means you are full. Wait 30 minutes before you try to eat again.  Take a chewable multivitamin daily in addition to other supplements as directed by your health care provider.  Sip at least 48-64 oz of liquid, preferably water, each day.  Stay away from concentrated sweets with more than 10 g of sugar per serving.  Protein is a very important part of the diet. Have protein with every meal when possible. Try eating your protein food first. Stage 1 bariatric diet Stage 1 will begin after surgery and last until about 2 weeks after surgery or as directed by your health care provider. You will be on clear liquids immediately after surgery. After your health care provider approves, you will move to full liquids. You will eat at scheduled times during the day (for example at 8 AM, 12 noon, or 5 PM). You will also take a liquid protein supplement as recommended by your dietitian. Your dietitian will let you know how much  and how often you can eat. Diet Guidelines  Limit intake to  cup of solid foods and  cup of beverages per meal.  You will need at least 60-80 g of protein daily or as determined by your dietitian. Guidelines for choosing a liquid protein supplement include:  At least 15 g of protein per 8 oz serving.  Less than 20 g total carbohydrate per 8 oz serving.  Less than 5 g fat per 8 oz serving.  Drink at least 48 oz (1440 mL) of fluid daily, which includes your protein supplement.  To get more protein, you can add 1 Tbsp non-fat dry milk powder to each  cup skim milk.  Avoid carbonated beverages, caffeine, alcohol, and concentrated sweets such as sugar, cakes, and cookies.  Take a chewable multivitamin complete with iron. Discuss additional supplements with your health care provider or dietitian. Beverages ( cup total per meal)  Decaffeinated coffee or tea.  Drinks with less than 25 g of sugar per serving.  Diet or sugar-free drinks.  Powdered drinks.  Sugar-free iced tea.  Broth.  Skim milk or lactose-free milk.  Unsweetened, plain soy milk.  Sugar-free gelatin dessert or frozen ice pops. Full Liquid Foods ( cup total per meal)  Protein-rich liquids (limit added protein powder to 25-30 g per serving).  Low-fat cream soup or soup that has been blended.  Artificially sweetened yogurt.  Sugar-free pudding.  Blended low-fat cottage cheese.  Plain yogurt or Greek yogurt (low-fat).  Unsweetened applesauce.  Hot   wheat cereal, cream of rice, grits. Stage 2 bariatric diet (soft or pureed diet) Stage 2 starts about 2 weeks after surgery and lasts until about 4 weeks after surgery. During this stage, you will eat soft, moist, ground, diced, or pureed foods in small meals, 3-6 times a day. Focus on protein-rich foods. You will also drink a liquid protein supplement between meals 2 times a day. After a week of soft protein foods, you can begin to add other soft foods in  addition to soft proteins. You should meet with your dietitian at this stage to begin preparation for Stage 3 of the bariatric diet. Diet Guidelines  Meals should not exceed -1 cup total per meal.  You will need to blend solid foods to the consistency of applesauce.  Choose low-fat foods. Low-fat foods are foods with less than 5 g of fat per serving.  Include a protein with every meal and snack. Eat the protein food first. Try to eat 60-80 g of protein per day when possible.  Choose grains made from white or refined grain. Choices should have no more than 2 g of fiber per serving.  Continue to eat mindfully and slowly and always listen to your body.  Staying hydrated is very important during this stage. Full liquids from Stage 1 may be used for a meal or snack replacement.  Slowly add other soft foods to your diet. Examples of soft foods that can be added to your diet are listed in the following section. Soft Protein Foods  Well-cooked beans and lentils.  Eggs (scrambled, soft-boiled).  Tofu and other soft soy products (tempeh or bean veggie burgers).  Fish.  Lean poultry, well cooked and soft. Can also try baby food chicken or turkey.  Ground meats.  Low-fat cottage cheese.  Hummus.  Fat-free or low-fat yogurt.  Gravy and light mayonnaise (to help moisten). Other Soft Foods  Soft fruit. This includes soft canned fruit in light syrup or natural juice, bananas, melons, peaches, pears, and strawberries.  Well-cooked vegetables.  Toast or crackers. Make sure these become soft by chewing them at least 20 times.  Hot wheat cereal.  Unflavored oatmeal.  Baby food or toddler fruits and vegetables.  Chicken or vegetable broth.  Blended fruit smoothies. Stage 3 bariatric diet (regular diet) This stage starts about 6-8 weeks after surgery and will continue to promote weight loss. You will be allowed to eat foods of various textures. Ask your dietitian to assist you in  meal planning and additional behavioral strategies to make this final stage a long-term success. Diet Guidelines  Meals should not exceed -1 cup. As you heal and advance, you may be able to eat a little more with each meal. Always listen to your body.  Your diet should include foods from all the food groups.  Slowly add recommended foods to your diet. See the following section for a list of recommended foods.  Eat only at your chosen meal times.  When you feel full, stop eating.  Carbohydrates should be limited. Eat no more than 30 g per meal or 130 g per day. There are about 15-20 g of carbohydrates in 1 piece of bread or a medium piece of fruit.  Stay hydrated. Drink at least 48-64 oz of noncarbonated, zero-calorie fluid per day. Water is the best choice.  At first, avoid hard-to-digest foods like popcorn, nuts, celery, seeds, and the white parts of citrus fruits. With time you may be able to eat these foods.  Take   any vitamin supplements as directed by your health care provider.  Do not fast or skip meals. If you are having a hard time eating, talk to your health care provider or dietitian. What foods can I eat in stage 3? Grains  Choose whole grains when possible; aim to get half of your total grains as whole grains. These include whole wheat breads, crackers, and pastas. Cold or hot cereals with no added sugars. Rice (brown or white). Vegetables  Choose a variety of vegetables. All are allowed. Fruits  Choose a variety of fruits. All are allowed. Meat and Other Protein Foods  Choose lean sources of protein such as poultry, fish, and eggs. You may need to cook meats to tender at first. Smooth nut butters. Beans. Dairy  Choose low-fat or nonfat dairy items (such as cheese, milk, and yogurt). Beverages  Decaffeinated coffee. Caffeine-free tea. Sugar-free soft drinks without caffeine. Limit alcohol. Condiments  All are acceptable. Choose low-fat and low-sodium when  possible. Sweets and Desserts  Low-fat, low-sugar options. As part of a healthy diet, everyone should limit added sugars. Fat and Oil  Choose healthy fats such as olive oil, canola oil, and avocados. The items listed above may not be a complete list of recommended foods or beverages. Contact your dietitian for more options.  This information is not intended to replace advice given to you by your health care provider. Make sure you discuss any questions you have with your health care provider. Document Released: 05/22/2003 Document Revised: 04/22/2016 Document Reviewed: 09/19/2013 Elsevier Interactive Patient Education  2017 [employer] Inc[employer]  

## 2017-01-27 LAB — CMP14+EGFR
A/G RATIO: 1.7 (ref 1.2–2.2)
ALT: 19 IU/L (ref 0–32)
AST: 22 IU/L (ref 0–40)
Albumin: 4.1 g/dL (ref 3.5–5.5)
Alkaline Phosphatase: 66 IU/L (ref 39–117)
BILIRUBIN TOTAL: 0.5 mg/dL (ref 0.0–1.2)
BUN/Creatinine Ratio: 13 (ref 9–23)
BUN: 9 mg/dL (ref 6–24)
CALCIUM: 9.1 mg/dL (ref 8.7–10.2)
CHLORIDE: 99 mmol/L (ref 96–106)
CO2: 23 mmol/L (ref 18–29)
Creatinine, Ser: 0.68 mg/dL (ref 0.57–1.00)
GFR calc non Af Amer: 106 mL/min/{1.73_m2} (ref >59)
GFR, EST AFRICAN AMERICAN: 122 mL/min/{1.73_m2} (ref >59)
GLUCOSE: 113 mg/dL — AB (ref 65–99)
Globulin, Total: 2.4 g/dL (ref 1.5–4.5)
POTASSIUM: 3.6 mmol/L (ref 3.5–5.2)
Sodium: 140 mmol/L (ref 134–144)
TOTAL PROTEIN: 6.5 g/dL (ref 6.0–8.5)

## 2017-01-27 LAB — LIPID PANEL
Chol/HDL Ratio: 3.5 ratio units (ref 0.0–4.4)
Cholesterol, Total: 112 mg/dL (ref 100–199)
HDL: 32 mg/dL — AB (ref >39)
LDL Calculated: 59 mg/dL (ref 0–99)
TRIGLYCERIDES: 103 mg/dL (ref 0–149)
VLDL CHOLESTEROL CAL: 21 mg/dL (ref 5–40)

## 2017-01-27 LAB — THYROID PANEL WITH TSH
FREE THYROXINE INDEX: 2.4 (ref 1.2–4.9)
T3 Uptake Ratio: 26 % (ref 24–39)
T4, Total: 9.1 ug/dL (ref 4.5–12.0)
TSH: 2.02 u[IU]/mL (ref 0.450–4.500)

## 2017-02-23 ENCOUNTER — Ambulatory Visit (INDEPENDENT_AMBULATORY_CARE_PROVIDER_SITE_OTHER): Payer: Medicaid Other | Admitting: *Deleted

## 2017-02-23 DIAGNOSIS — Z309 Encounter for contraceptive management, unspecified: Secondary | ICD-10-CM | POA: Diagnosis not present

## 2017-02-23 MED ORDER — MEDROXYPROGESTERONE ACETATE 150 MG/ML IM SUSP
150.0000 mg | INTRAMUSCULAR | Status: DC
  Administered 2017-02-23 – 2017-08-11 (×3): 150 mg via INTRAMUSCULAR

## 2017-03-30 ENCOUNTER — Telehealth: Payer: Self-pay | Admitting: Nurse Practitioner

## 2017-03-30 ENCOUNTER — Other Ambulatory Visit: Payer: Self-pay | Admitting: Nurse Practitioner

## 2017-03-30 DIAGNOSIS — S86911A Strain of unspecified muscle(s) and tendon(s) at lower leg level, right leg, initial encounter: Secondary | ICD-10-CM

## 2017-03-31 MED ORDER — CETIRIZINE HCL 10 MG PO CAPS: 1.0000 | ORAL_CAPSULE | Freq: Every day | ORAL | 3 refills | Status: DC

## 2017-05-05 ENCOUNTER — Encounter: Payer: Self-pay | Admitting: Nurse Practitioner

## 2017-05-05 ENCOUNTER — Ambulatory Visit (INDEPENDENT_AMBULATORY_CARE_PROVIDER_SITE_OTHER): Payer: Medicaid Other | Admitting: Nurse Practitioner

## 2017-05-05 VITALS — BP 148/94 | HR 112 | Temp 98.3°F | Ht 61.0 in | Wt 278.0 lb

## 2017-05-05 DIAGNOSIS — F5081 Binge eating disorder: Secondary | ICD-10-CM

## 2017-05-05 DIAGNOSIS — K219 Gastro-esophageal reflux disease without esophagitis: Secondary | ICD-10-CM | POA: Diagnosis not present

## 2017-05-05 DIAGNOSIS — M545 Low back pain: Secondary | ICD-10-CM | POA: Diagnosis not present

## 2017-05-05 DIAGNOSIS — G8929 Other chronic pain: Secondary | ICD-10-CM

## 2017-05-05 DIAGNOSIS — I10 Essential (primary) hypertension: Secondary | ICD-10-CM | POA: Diagnosis not present

## 2017-05-05 DIAGNOSIS — F50819 Binge eating disorder, unspecified: Secondary | ICD-10-CM

## 2017-05-05 DIAGNOSIS — G43711 Chronic migraine without aura, intractable, with status migrainosus: Secondary | ICD-10-CM

## 2017-05-05 DIAGNOSIS — E032 Hypothyroidism due to medicaments and other exogenous substances: Secondary | ICD-10-CM

## 2017-05-05 DIAGNOSIS — N939 Abnormal uterine and vaginal bleeding, unspecified: Secondary | ICD-10-CM | POA: Diagnosis not present

## 2017-05-05 DIAGNOSIS — F4001 Agoraphobia with panic disorder: Secondary | ICD-10-CM

## 2017-05-05 DIAGNOSIS — E785 Hyperlipidemia, unspecified: Secondary | ICD-10-CM | POA: Diagnosis not present

## 2017-05-05 DIAGNOSIS — F3162 Bipolar disorder, current episode mixed, moderate: Secondary | ICD-10-CM | POA: Diagnosis not present

## 2017-05-05 MED ORDER — CLONAZEPAM 0.5 MG PO TABS: 0.5000 mg | ORAL_TABLET | Freq: Two times a day (BID) | ORAL | 2 refills | Status: DC

## 2017-05-05 MED ORDER — MEDROXYPROGESTERONE ACETATE 150 MG/ML IM SUSY: 150.0000 mg | PREFILLED_SYRINGE | INTRAMUSCULAR | 3 refills | Status: DC

## 2017-05-05 MED ORDER — HYDROCHLOROTHIAZIDE 25 MG PO TABS: 25.0000 mg | ORAL_TABLET | Freq: Every day | ORAL | 5 refills | Status: DC

## 2017-05-05 MED ORDER — PRAVASTATIN SODIUM 40 MG PO TABS: ORAL_TABLET | ORAL | 1 refills | Status: DC

## 2017-05-05 MED ORDER — MEDROXYPROGESTERONE ACETATE 10 MG PO TABS: 10.0000 mg | ORAL_TABLET | Freq: Every day | ORAL | 0 refills | Status: DC

## 2017-05-05 MED ORDER — LAMOTRIGINE 100 MG PO TABS: 100.0000 mg | ORAL_TABLET | Freq: Every day | ORAL | 5 refills | Status: DC

## 2017-05-05 MED ORDER — LEVOTHYROXINE SODIUM 88 MCG PO TABS: ORAL_TABLET | ORAL | 1 refills | Status: DC

## 2017-05-05 MED ORDER — LOSARTAN POTASSIUM 100 MG PO TABS: 100.0000 mg | ORAL_TABLET | Freq: Every day | ORAL | 5 refills | Status: DC

## 2017-05-05 NOTE — Progress Notes (Signed)
Subjective:    Patient ID: Dawn Craig, female    DOB: Aug 17, 1971, 46 y.o.   MRN: [id number]  HPI  Dawn Craig is here today for follow up of chronic medical problem.  Outpatient Encounter Prescriptions as of 05/05/2017  Medication Sig  . betamethasone dipropionate (DIPROLENE) 0.05 % cream Apply topically 2 (two) times daily.  . Cetirizine HCl 10 MG CAPS Take 1 capsule (10 mg total) by mouth daily.  . Cholecalciferol (VITAMIN D) 2000 UNITS CAPS Take by mouth.  . clonazePAM (KLONOPIN) 0.5 MG tablet Take 1 tablet (0.5 mg total) by mouth 2 (two) times daily.  . hydrochlorothiazide (HYDRODIURIL) 25 MG tablet Take 1 tablet (25 mg total) by mouth daily.  Marland Kitchen lamoTRIgine (LAMICTAL) 100 MG tablet Take 1 tablet (100 mg total) by mouth daily.  Marland Kitchen levothyroxine (SYNTHROID, LEVOTHROID) 88 MCG tablet TAKE 1 TABLET (88 MCG TOTAL) BY MOUTH DAILY.  Marland Kitchen losartan (COZAAR) 100 MG tablet Take 1 tablet (100 mg total) by mouth daily.  . MedroxyPROGESTERone Acetate 150 MG/ML SUSY INJECT 1 ML (150 MG TOTAL) INTO THE MUSCLE ONCE.  . naproxen (NAPROSYN) 500 MG tablet TAKE 1 TABLET (500 MG TOTAL) BY MOUTH 2 (TWO) TIMES DAILY WITH A MEAL.  . pravastatin (PRAVACHOL) 40 MG tablet TAKE 1 TABLET (40 MG TOTAL) BY MOUTH DAILY.  Marland Kitchen triamcinolone cream (KENALOG) 0.1 % Apply 1 application topically 2 (two) times daily.     1. Intractable chronic migraine without aura and with status migrainosus  Doing better- has stopped caffeine and is doing better- last migraine was over a month ago.  2. Essential hypertension, benign  No chest pain,sob or HA- does not check blood pressure at home  3. Gastroesophageal reflux disease, esophagitis presence not specified  Was on zantac but as no needed in several months- she has changed her diet and it is better  4. 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 Severe obesity (BMI >= 40) (HCC)  weight is down 6 lbs  6. Hyperlipidemia with target LDL less than  100  Has been trying to watch diet  7. Bipolar 1 disorder, mixed, moderate (HCC)   she is currently on is clonazepam and lamictal and she is doing well right now  8. Binge eating disorder  Says that she is doing much better and has not been binge eating at all. She eats till she is full then stops.  9. Agoraphobia with panic attacks  Hates to go out in public    New complaints: -irritating cough-has had for awhile -patient on depoprovera shots and has been bleeding a lot since may- not normal for her to bleed like that. -chronic low back pain- seeing chiropractor and is helping- needs referral    Review of Systems  Constitutional: Negative for diaphoresis.  HENT: Negative.   Eyes: Negative for pain.  Respiratory: Negative for shortness of breath.   Cardiovascular: Negative for chest pain, palpitations and leg swelling.  Gastrointestinal: Negative for abdominal pain.  Endocrine: Negative for polydipsia.  Genitourinary: Negative.   Skin: Negative for rash.  Neurological: Negative for dizziness, weakness and headaches.  Hematological: Does not bruise/bleed easily.  Psychiatric/Behavioral: Negative.   All other systems reviewed and are negative.      Objective:   Physical Exam  Constitutional: She is oriented to person, place, and time. She appears well-developed and well-nourished.  HENT:  Nose: Nose normal.  Mouth/Throat: Oropharynx is clear and moist.  Eyes: EOM are normal.  Neck: Trachea normal,  normal range of motion and full passive range of motion without pain. Neck supple. No JVD present. Carotid bruit is not present. No thyromegaly present.  Cardiovascular: Normal rate, regular rhythm, normal heart sounds and intact distal pulses.  Exam reveals no gallop and no friction rub.   No murmur heard. Pulmonary/Chest: Effort normal and breath sounds normal.  Abdominal: Soft. Bowel sounds are normal. She exhibits no distension and no mass. There is no tenderness.    Musculoskeletal: Normal range of motion.  Lymphadenopathy:    She has no cervical adenopathy.  Neurological: She is alert and oriented to person, place, and time. She has normal reflexes.  Skin: Skin is warm and dry.  Psychiatric: She has a normal mood and affect. Her behavior is normal. Judgment and thought content normal.   BP (!) 148/94   Pulse (!) 112   Temp 98.3 F (36.8 C) (Oral)   Ht '5\' 1"'  (1.549 m)   Wt 278 lb (126.1 kg)   BMI 52.53 kg/m       Assessment & Plan:  1. Intractable chronic migraine without aura and with status migrainosus Continue to avoid caffeine  2. Essential hypertension, benign Low sodium diet - hydrochlorothiazide (HYDRODIURIL) 25 MG tablet; Take 1 tablet (25 mg total) by mouth daily.  Dispense: 30 tablet; Refill: 5 - losartan (COZAAR) 100 MG tablet; Take 1 tablet (100 mg total) by mouth daily.  Dispense: 30 tablet; Refill: 5 - CMP14+EGFR  3. Gastroesophageal reflux disease, esophagitis presence not specified Need to go back on zantac and see if will help with cough  4. 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  Dispense: 90 tablet; Refill: 1 - Thyroid Panel With TSH  5. Severe obesity (BMI >= 40) (HCC) Discussed diet and exercise for person with BMI >25 Will recheck weight in 3-6 months  6. Hyperlipidemia with target LDL less than 100 Low fat diet - pravastatin (PRAVACHOL) 40 MG tablet; TAKE 1 TABLET (40 MG TOTAL) BY MOUTH DAILY.  Dispense: 90 tablet; Refill: 1 - Lipid panel  7. Bipolar 1 disorder, mixed, moderate (HCC) - lamoTRIgine (LAMICTAL) 100 MG tablet; Take 1 tablet (100 mg total) by mouth daily.  Dispense: 60 tablet; Refill: 5  8. Binge eating disorder  9. Agoraphobia with panic attacks - clonazePAM (KLONOPIN) 0.5 MG tablet; Take 1 tablet (0.5 mg total) by mouth 2 (two) times daily.  Dispense: 60 tablet; Refill:  2  10. Abnormal uterine bleeding If provera does not help with bleeding will need to do endometrial bx - medroxyPROGESTERone (PROVERA) 10 MG tablet; Take 1 tablet (10 mg total) by mouth daily.  Dispense: 10 tablet; Refill: 0 - CBC with Differential/Platelet  11. Chronic midline low back pain without sciatica - Ambulatory referral to Chiropractic    Labs pending Health maintenance reviewed Diet and exercise encouraged Continue all meds Follow up  In 3 months   [place], FNP

## 2017-05-05 NOTE — Patient Instructions (Signed)

## 2017-05-06 LAB — THYROID PANEL WITH TSH
FREE THYROXINE INDEX: 2.8 (ref 1.2–4.9)
T3 UPTAKE RATIO: 28 % (ref 24–39)
T4, Total: 10.1 ug/dL (ref 4.5–12.0)
TSH: 2.03 u[IU]/mL (ref 0.450–4.500)

## 2017-05-06 LAB — CBC WITH DIFFERENTIAL/PLATELET
Basophils Absolute: 0 10*3/uL (ref 0.0–0.2)
Basos: 0 %
EOS (ABSOLUTE): 0.2 10*3/uL (ref 0.0–0.4)
EOS: 2 %
HEMATOCRIT: 41.4 % (ref 34.0–46.6)
HEMOGLOBIN: 14.3 g/dL (ref 11.1–15.9)
IMMATURE GRANULOCYTES: 0 %
Immature Grans (Abs): 0 10*3/uL (ref 0.0–0.1)
LYMPHS: 35 %
Lymphocytes Absolute: 3.5 10*3/uL — ABNORMAL HIGH (ref 0.7–3.1)
MCH: 31 pg (ref 26.6–33.0)
MCHC: 34.5 g/dL (ref 31.5–35.7)
MCV: 90 fL (ref 79–97)
MONOCYTES: 7 %
Monocytes Absolute: 0.7 10*3/uL (ref 0.1–0.9)
Neutrophils Absolute: 5.7 10*3/uL (ref 1.4–7.0)
Neutrophils: 56 %
Platelets: 304 10*3/uL (ref 150–379)
RBC: 4.61 x10E6/uL (ref 3.77–5.28)
RDW: 13.5 % (ref 12.3–15.4)
WBC: 10.2 10*3/uL (ref 3.4–10.8)

## 2017-05-06 LAB — CMP14+EGFR
ALBUMIN: 4.4 g/dL (ref 3.5–5.5)
ALT: 20 IU/L (ref 0–32)
AST: 20 IU/L (ref 0–40)
Albumin/Globulin Ratio: 1.9 (ref 1.2–2.2)
Alkaline Phosphatase: 71 IU/L (ref 39–117)
BUN/Creatinine Ratio: 10 (ref 9–23)
BUN: 7 mg/dL (ref 6–24)
Bilirubin Total: 0.8 mg/dL (ref 0.0–1.2)
CALCIUM: 9.7 mg/dL (ref 8.7–10.2)
CO2: 25 mmol/L (ref 18–29)
CREATININE: 0.71 mg/dL (ref 0.57–1.00)
Chloride: 100 mmol/L (ref 96–106)
GFR calc Af Amer: 119 mL/min/{1.73_m2} (ref >59)
GFR, EST NON AFRICAN AMERICAN: 103 mL/min/{1.73_m2} (ref >59)
GLOBULIN, TOTAL: 2.3 g/dL (ref 1.5–4.5)
Glucose: 96 mg/dL (ref 65–99)
Potassium: 3.6 mmol/L (ref 3.5–5.2)
SODIUM: 142 mmol/L (ref 134–144)
Total Protein: 6.7 g/dL (ref 6.0–8.5)

## 2017-05-06 LAB — LIPID PANEL
CHOL/HDL RATIO: 4.7 ratio — AB (ref 0.0–4.4)
Cholesterol, Total: 136 mg/dL (ref 100–199)
HDL: 29 mg/dL — ABNORMAL LOW (ref >39)
LDL CALC: 75 mg/dL (ref 0–99)
TRIGLYCERIDES: 161 mg/dL — AB (ref 0–149)
VLDL CHOLESTEROL CAL: 32 mg/dL (ref 5–40)

## 2017-05-18 ENCOUNTER — Ambulatory Visit (INDEPENDENT_AMBULATORY_CARE_PROVIDER_SITE_OTHER): Payer: Medicaid Other

## 2017-05-18 DIAGNOSIS — Z3042 Encounter for surveillance of injectable contraceptive: Secondary | ICD-10-CM

## 2017-05-18 DIAGNOSIS — Z309 Encounter for contraceptive management, unspecified: Secondary | ICD-10-CM

## 2017-07-02 ENCOUNTER — Other Ambulatory Visit: Payer: Self-pay | Admitting: Nurse Practitioner

## 2017-07-02 DIAGNOSIS — E032 Hypothyroidism due to medicaments and other exogenous substances: Secondary | ICD-10-CM

## 2017-08-05 ENCOUNTER — Encounter: Payer: Self-pay | Admitting: Family Medicine

## 2017-08-05 ENCOUNTER — Ambulatory Visit (INDEPENDENT_AMBULATORY_CARE_PROVIDER_SITE_OTHER): Payer: Medicaid Other | Admitting: Family Medicine

## 2017-08-05 VITALS — BP 137/94 | HR 95 | Temp 99.6°F | Ht 61.0 in | Wt 274.8 lb

## 2017-08-05 DIAGNOSIS — H9202 Otalgia, left ear: Secondary | ICD-10-CM | POA: Diagnosis not present

## 2017-08-05 MED ORDER — ONDANSETRON HCL 4 MG PO TABS: 4.0000 mg | ORAL_TABLET | Freq: Three times a day (TID) | ORAL | 0 refills | Status: DC | PRN

## 2017-08-05 MED ORDER — AZITHROMYCIN 250 MG PO TABS: ORAL_TABLET | ORAL | 0 refills | Status: DC

## 2017-08-05 MED ORDER — METHYLPREDNISOLONE ACETATE 80 MG/ML IJ SUSP
80.0000 mg | Freq: Once | INTRAMUSCULAR | Status: AC
  Administered 2017-08-05: 80 mg via INTRAMUSCULAR

## 2017-08-05 NOTE — Patient Instructions (Signed)
Great to see you!  I believe your symptoms are due to eustacian tube dysfunction, flonase may help this long term For now we will treat you with azithromycin and the injection of depo medrol ( a steroid)  Call back with any concerns

## 2017-08-05 NOTE — Progress Notes (Signed)
   HPI  Patient presents today here with left ear pain and illness.  Patient explains that for the last 2-3 months she's had off and on left ear pain. Over the last 3 days she's had persistent severe left ear pain. Her hearing is normal. She has a clicking sound in her left ear. She also has nasal congestion, mild dry cough, and mild left-sided headache. Patient states that the ear pain extends onto her neck. She's tolerating food and fluids like usual. She denies overt fever, she has had some subjective fever.  She denies severe shortness of breath or chest pain. She also has some new scotoma over the last 2-3 days  PMH: Smoking status noted ROS: Per HPI  Objective: BP (!) 137/94   Pulse 95   Temp 99.6 F (37.6 C) (Oral)   Ht 5\' 1"  (1.549 m)   Wt 274 lb 12.8 oz (124.6 kg)   BMI 51.92 kg/m  Gen: NAD, alert, cooperative with exam HEENT: NCAT, TMs with effusion bilaterally right greater than left, very mild mastoid tenderness in the left, oropharynx moist and clear, PERRLA, EOMI Neck: Full range of motion without limitation due to pain or severe pain. CV: RRR, good S1/S2, no murmur Resp: CTABL, no wheezes, non-labored Abd: SNTND, BS present, no guarding or organomegaly Ext: No edema, warm Neuro: Alert and oriented, No gross deficits  [facility] 08/05/2017 05/05/2017 01/26/2017 12/01/2016 11/19/2016  Decreased Interest 0 3 3 2 2   Down, Depressed, Hopeless 0 3 2 2 2   PHQ - 2 Score 0 6 5 4 4   Altered sleeping - 3 3 2 2   Tired, decreased energy - 3 3 3 3   Change in appetite - 2 2 3 3   Feeling bad or failure about yourself  - 3 2 2 2   Trouble concentrating - 3 2 2 2   Moving slowly or fidgety/restless - 3 2 1 2   Suicidal thoughts - 3 1 0 0  PHQ-9 Score - 26 20 17 18   Some recent data might be hidden     Assessment and plan:  # Left ear pain Unusual constellation of symptoms, however I believe that she most likely has viral illness with worsening eustachian tube  dysfunction. Given subjective fever and severe pain on ahead and given her azithromycin, she states amoxicillin causes too many side effects. Also given her IM Depo-Medrol to hopefully help her eustachian tube open.  Patient states that she gets nauseous with medications, Zofran has been sent.    Meds ordered this encounter  Medications  . azithromycin (ZITHROMAX) 250 MG tablet    Sig: Take 2 tablets on day 1 and 1 tablet daily after that    Dispense:  6 tablet    Refill:  0  . methylPREDNISolone acetate (DEPO-MEDROL) injection 80 mg  . ondansetron (ZOFRAN) 4 MG tablet    Sig: Take 1 tablet (4 mg total) by mouth every 8 (eight) hours as needed for nausea or vomiting.    Dispense:  20 tablet    Refill:  0    Murtis SinkSam Bradshaw, MD [facility] 08/05/2017, 12:26 PM

## 2017-08-09 ENCOUNTER — Other Ambulatory Visit: Payer: Self-pay | Admitting: Nurse Practitioner

## 2017-08-11 ENCOUNTER — Encounter: Payer: Self-pay | Admitting: Nurse Practitioner

## 2017-08-11 ENCOUNTER — Ambulatory Visit (INDEPENDENT_AMBULATORY_CARE_PROVIDER_SITE_OTHER): Payer: Medicaid Other | Admitting: Nurse Practitioner

## 2017-08-11 VITALS — BP 135/93 | HR 100 | Temp 97.7°F | Ht 61.0 in | Wt 272.0 lb

## 2017-08-11 DIAGNOSIS — F4001 Agoraphobia with panic disorder: Secondary | ICD-10-CM

## 2017-08-11 DIAGNOSIS — E032 Hypothyroidism due to medicaments and other exogenous substances: Secondary | ICD-10-CM | POA: Diagnosis not present

## 2017-08-11 DIAGNOSIS — K219 Gastro-esophageal reflux disease without esophagitis: Secondary | ICD-10-CM | POA: Diagnosis not present

## 2017-08-11 DIAGNOSIS — Z3042 Encounter for surveillance of injectable contraceptive: Secondary | ICD-10-CM

## 2017-08-11 DIAGNOSIS — F5081 Binge eating disorder: Secondary | ICD-10-CM

## 2017-08-11 DIAGNOSIS — G43711 Chronic migraine without aura, intractable, with status migrainosus: Secondary | ICD-10-CM

## 2017-08-11 DIAGNOSIS — R198 Other specified symptoms and signs involving the digestive system and abdomen: Secondary | ICD-10-CM | POA: Diagnosis not present

## 2017-08-11 DIAGNOSIS — I10 Essential (primary) hypertension: Secondary | ICD-10-CM | POA: Diagnosis not present

## 2017-08-11 DIAGNOSIS — H6983 Other specified disorders of Eustachian tube, bilateral: Secondary | ICD-10-CM

## 2017-08-11 DIAGNOSIS — F3162 Bipolar disorder, current episode mixed, moderate: Secondary | ICD-10-CM

## 2017-08-11 DIAGNOSIS — E785 Hyperlipidemia, unspecified: Secondary | ICD-10-CM | POA: Diagnosis not present

## 2017-08-11 MED ORDER — RANITIDINE HCL 150 MG PO TABS: 150.0000 mg | ORAL_TABLET | Freq: Two times a day (BID) | ORAL | 3 refills | Status: DC

## 2017-08-11 MED ORDER — CLONAZEPAM 0.5 MG PO TABS: 0.5000 mg | ORAL_TABLET | Freq: Two times a day (BID) | ORAL | 2 refills | Status: DC

## 2017-08-11 NOTE — Patient Instructions (Signed)
Abdominal Bloating °When you have abdominal bloating, your abdomen may feel full, tight, or painful. It may also look bigger than normal or swollen (distended). Common causes of abdominal bloating include: °· Swallowing air. °· Constipation. °· Problems digesting food. °· Eating too much. °· Irritable bowel syndrome. This is a condition that affects the large intestine. °· Lactose intolerance. This is an inability to digest lactose, a natural sugar in dairy products. °· Celiac disease. This is a condition that affects the ability to digest gluten, a protein found in some grains. °· Gastroparesis. This is a condition that slows down the movement of food in the stomach and small intestine. It is more common in people with diabetes mellitus. °· Gastroesophageal reflux disease (GERD). This is a digestive condition that makes stomach acid flow back into the esophagus. °· Urinary retention. This means that the body is holding onto urine, and the bladder cannot be emptied all the way. ° °Follow these instructions at home: °Eating and drinking °· Avoid eating too much. °· Try not to swallow air while talking or eating. °· Avoid eating while lying down. °· Avoid these foods and drinks: °? Foods that cause gas, such as broccoli, cabbage, cauliflower, and baked beans. °? Carbonated drinks. °? Hard candy. °? Chewing gum. °Medicines °· Take over-the-counter and prescription medicines only as told by your health care provider. °· Take probiotic medicines. These medicines contain live bacteria or yeasts that can help digestion. °· Take coated peppermint oil capsules. °Activity °· Try to exercise regularly. Exercise may help to relieve bloating that is caused by gas and relieve constipation. °General instructions °· Keep all follow-up visits as told by your health care provider. This is important. °Contact a health care provider if: °· You have nausea and vomiting. °· You have diarrhea. °· You have abdominal pain. °· You have  unusual weight loss or weight gain. °· You have severe pain, and medicines do not help. °Get help right away if: °· You have severe chest pain. °· You have trouble breathing. °· You have shortness of breath. °· You have trouble urinating. °· You have darker urine than normal. °· You have blood in your stools or have dark, tarry stools. °Summary °· Abdominal bloating means that the abdomen is swollen. °· Common causes of abdominal bloating are swallowing air, constipation, and problems digesting food. °· Avoid eating too much and avoid swallowing air. °· Avoid foods that cause gas, carbonated drinks, hard candy, and chewing gum. °This information is not intended to replace advice given to you by your health care provider. Make sure you discuss any questions you have with your health care provider. °Document Released: 12/17/2016 Document Revised: 12/17/2016 Document Reviewed: 12/17/2016 °Elsevier Interactive Patient Education © 2018 [employer] ° °

## 2017-08-11 NOTE — Progress Notes (Signed)
Subjective:    Patient ID: Dawn Craig, female    DOB: 02/27/1971, 46 y.o.   MRN: [id number]  HPI  Dawn Craig is here today for follow up of chronic medical problem.  Outpatient Encounter Prescriptions as of 08/11/2017  Medication Sig  . betamethasone dipropionate (DIPROLENE) 0.05 % cream Apply topically 2 (two) times daily.  . cetirizine (ZYRTEC) 10 MG tablet TAKE 1 CAPSULE (10 MG TOTAL) BY MOUTH DAILY.  Marland Kitchen. Cholecalciferol (VITAMIN D) 2000 UNITS CAPS Take by mouth.  . clonazePAM (KLONOPIN) 0.5 MG tablet Take 1 tablet (0.5 mg total) by mouth 2 (two) times daily.  . fluticasone (FLONASE) 50 MCG/ACT nasal spray Place into both nostrils daily.  . hydrochlorothiazide (HYDRODIURIL) 25 MG tablet Take 1 tablet (25 mg total) by mouth daily.  Marland Kitchen. lamoTRIgine (LAMICTAL) 100 MG tablet Take 1 tablet (100 mg total) by mouth daily.  Marland Kitchen. levothyroxine (SYNTHROID, LEVOTHROID) 88 MCG tablet TAKE 1 TABLET (88 MCG TOTAL) BY MOUTH DAILY.  Marland Kitchen. losartan (COZAAR) 100 MG tablet Take 1 tablet (100 mg total) by mouth daily.  . MedroxyPROGESTERone Acetate 150 MG/ML SUSY Inject 1 mL (150 mg total) into the muscle every 3 (three) months.  . naproxen (NAPROSYN) 500 MG tablet TAKE 1 TABLET (500 MG TOTAL) BY MOUTH 2 (TWO) TIMES DAILY WITH A MEAL.  Marland Kitchen. ondansetron (ZOFRAN) 4 MG tablet Take 1 tablet (4 mg total) by mouth every 8 (eight) hours as needed for nausea or vomiting.  . triamcinolone cream (KENALOG) 0.1 % Apply 1 application topically 2 (two) times daily.     1. 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 She usually just takes a zofran for nausea and just lets migraine go away on its own  2. Essential hypertension, benign  No c/o chest pain, SOB or headache. Does not check blood pressure at home  3. She takes OTC meds when needed  4. 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 Severe  obesity (BMI >= 40) (HCC)  No recent weight changes  6. Hyperlipidemia with target LDL less than 100  Does not watch diet- eating is the only thing she enjoys doing  7. Bipolar 1 disorder, mixed, moderate (HCC)  She has been oin lamictal for many years . Does well for her. Still has days when she feels down  8. Binge eating disorder  cannot control it sometimes  9. Agoraphobia with panic attacks  patient does not like to be around other people or crowds. Is a home body.    New complaints: - lots of gas- has been alternating from diarrhea to constipation for 3 weeks but her diet has been all messed up from assines - saw Dr. Ermalinda MemosBradshaw last week with eustachian tube dysfunction- was given antibiotic- still having ear pain. Social history: Lives alone- has 1 son that lives [place] that she does not get to see very much.    Review of Systems  Constitutional: Negative for chills and fever.  HENT: Positive for ear pain (bil). Negative for congestion, sinus pain, sinus pressure, sneezing and sore throat.   Respiratory: Negative for cough and shortness of breath.   Cardiovascular: Negative.   Gastrointestinal: Positive for constipation, diarrhea and nausea. Negative for vomiting.  Genitourinary: Negative.   Neurological: Negative.   Psychiatric/Behavioral: Negative.        Objective:   Physical Exam  Constitutional: She is oriented to  person, place, and time. She appears well-developed and well-nourished.  HENT:  Right Ear: External ear normal.  Left Ear: External ear normal.  Nose: Nose normal.  Mouth/Throat: Oropharynx is clear and moist.  Eyes: EOM are normal.  Neck: Trachea normal, normal range of motion and full passive range of motion without pain. Neck supple. No JVD present. Carotid bruit is not present. No thyromegaly present.  Cardiovascular: Normal rate, regular rhythm, normal heart sounds and intact distal pulses.  Exam reveals no gallop and no friction rub.   No murmur  heard. Pulmonary/Chest: Effort normal and breath sounds normal.  Abdominal: Soft. Bowel sounds are normal. She exhibits no distension and no mass. There is no tenderness.  Musculoskeletal: Normal range of motion.  Lymphadenopathy:    She has no cervical adenopathy.  Neurological: She is alert and oriented to person, place, and time. She has normal reflexes.  Skin: Skin is warm and dry.  Psychiatric: She has a normal mood and affect. Her behavior is normal. Judgment and thought content normal.   BP (!) 135/93   Pulse 100   Temp 97.7 F (36.5 C) (Oral)   Ht  (1.549 m)   Wt 272 lb (123.4 kg)   BMI 51.39 kg/m       Assessment & Plan:  1. 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 Essential hypertension, benign Low sodium diet  3. Gastroesophageal reflux disease, esophagitis presence not specified Take zantac BID Avoid spicy foods Do not eat 2 hours prior to bedtime   4. 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 Severe obesity (BMI >= 40) (HCC) Discussed diet and exercise for person with BMI >25 Will recheck weight in 3-6 months  6. Hyperlipidemia with target LDL less than 100 Low fat diet  7. Bipolar 1 disorder, mixed, moderate (HCC) Continue lamictal as rx  8. Binge eating disorder  9. Agoraphobia with panic attacks - clonazePAM (KLONOPIN) 0.5 MG tablet; Take 1 tablet (0.5 mg total) by mouth 2 (two) times daily.  Dispense: 60 tablet; Refill: 2  10. Dysfunction of both eustachian tubes OTC decongestant along with flonase  11. Abnormal bowel habits Keep diary See if gasx or beano will help with gassiness so can get back to normal eating habits    Labs pending Health maintenance reviewed Diet and exercise encouraged Continue all meds Follow up  In 3 months   Mary-Margaret Daphine Deutscher, FNP

## 2017-08-12 LAB — CMP14+EGFR
ALK PHOS: 77 IU/L (ref 39–117)
ALT: 15 IU/L (ref 0–32)
AST: 13 IU/L (ref 0–40)
Albumin/Globulin Ratio: 1.9 (ref 1.2–2.2)
Albumin: 4.5 g/dL (ref 3.5–5.5)
BUN/Creatinine Ratio: 9 (ref 9–23)
BUN: 7 mg/dL (ref 6–24)
Bilirubin Total: 0.5 mg/dL (ref 0.0–1.2)
CO2: 23 mmol/L (ref 20–29)
Calcium: 9.6 mg/dL (ref 8.7–10.2)
Chloride: 101 mmol/L (ref 96–106)
Creatinine, Ser: 0.75 mg/dL (ref 0.57–1.00)
GFR calc Af Amer: 111 mL/min/{1.73_m2} (ref >59)
GFR calc non Af Amer: 97 mL/min/{1.73_m2} (ref >59)
GLOBULIN, TOTAL: 2.4 g/dL (ref 1.5–4.5)
Glucose: 98 mg/dL (ref 65–99)
POTASSIUM: 3.8 mmol/L (ref 3.5–5.2)
SODIUM: 142 mmol/L (ref 134–144)
Total Protein: 6.9 g/dL (ref 6.0–8.5)

## 2017-08-12 LAB — THYROID PANEL WITH TSH
FREE THYROXINE INDEX: 2.1 (ref 1.2–4.9)
T3 Uptake Ratio: 26 % (ref 24–39)
T4, Total: 8.1 ug/dL (ref 4.5–12.0)
TSH: 1.75 u[IU]/mL (ref 0.450–4.500)

## 2017-08-12 LAB — LIPID PANEL
Chol/HDL Ratio: 3.8 ratio (ref 0.0–4.4)
Cholesterol, Total: 138 mg/dL (ref 100–199)
HDL: 36 mg/dL — AB (ref >39)
LDL Calculated: 79 mg/dL (ref 0–99)
TRIGLYCERIDES: 117 mg/dL (ref 0–149)
VLDL CHOLESTEROL CAL: 23 mg/dL (ref 5–40)

## 2017-08-15 ENCOUNTER — Ambulatory Visit: Payer: Medicaid Other

## 2017-09-08 ENCOUNTER — Other Ambulatory Visit: Payer: Self-pay | Admitting: Nurse Practitioner

## 2017-11-03 ENCOUNTER — Ambulatory Visit (INDEPENDENT_AMBULATORY_CARE_PROVIDER_SITE_OTHER): Payer: Medicaid Other | Admitting: *Deleted

## 2017-11-03 DIAGNOSIS — Z23 Encounter for immunization: Secondary | ICD-10-CM | POA: Diagnosis not present

## 2017-11-04 ENCOUNTER — Ambulatory Visit: Payer: Medicaid Other | Admitting: Family Medicine

## 2017-11-04 ENCOUNTER — Ambulatory Visit (INDEPENDENT_AMBULATORY_CARE_PROVIDER_SITE_OTHER): Payer: Medicaid Other

## 2017-11-04 ENCOUNTER — Ambulatory Visit: Payer: Medicaid Other | Admitting: Family

## 2017-11-04 ENCOUNTER — Encounter: Payer: Self-pay | Admitting: Family Medicine

## 2017-11-04 VITALS — BP 132/82 | HR 116 | Temp 99.4°F | Ht 61.0 in | Wt 275.0 lb

## 2017-11-04 DIAGNOSIS — R000 Tachycardia, unspecified: Secondary | ICD-10-CM | POA: Diagnosis not present

## 2017-11-04 DIAGNOSIS — R059 Cough, unspecified: Secondary | ICD-10-CM

## 2017-11-04 DIAGNOSIS — R05 Cough: Secondary | ICD-10-CM | POA: Diagnosis not present

## 2017-11-04 DIAGNOSIS — R0602 Shortness of breath: Secondary | ICD-10-CM

## 2017-11-04 MED ORDER — AZITHROMYCIN 250 MG PO TABS: ORAL_TABLET | ORAL | 0 refills | Status: DC

## 2017-11-04 MED ORDER — BENZONATATE 200 MG PO CAPS: 200.0000 mg | ORAL_CAPSULE | Freq: Two times a day (BID) | ORAL | 0 refills | Status: DC | PRN

## 2017-11-04 MED ORDER — ALBUTEROL SULFATE HFA 108 (90 BASE) MCG/ACT IN AERS: 2.0000 | INHALATION_SPRAY | Freq: Four times a day (QID) | RESPIRATORY_TRACT | 0 refills | Status: DC | PRN

## 2017-11-04 NOTE — Patient Instructions (Addendum)
You had a chest xray today which showed a possible left lower lobe infiltrate.  You had labs performed today.  You will be contacted with the results of the labs once they are available, usually in the next 3 days for routine lab work.  - Get plenty of rest and drink plenty of fluids. - Try to breathe moist air. Use a cold mist humidifier. - Consume warm fluids (soup or tea) to provide relief for a stuffy nose and to loosen phlegm. - For nasal stuffiness, try saline nasal spray or a Neti Pot. Afrin nasal spray can also be used but this product should not be used longer than 3 days or it will cause rebound nasal stuffiness (worsening nasal congestion). - For sore throat pain relief: suck on throat lozenges, hard candy or popsicles; gargle with warm salt water (1/4 tsp. salt per 8 oz. of water); and eat soft, bland foods. - Eat a well-balanced diet. If you cannot, ensure you are getting enough nutrients by taking a daily multivitamin. - Avoid dairy products, as they can thicken phlegm. - Avoid alcohol, as it impairs your body's immune system.  CONTACT YOUR DOCTOR IF YOU EXPERIENCE ANY OF THE FOLLOWING: - High fever - Ear pain - Sinus-type headache - Unusually severe cold symptoms - Cough that gets worse while other cold symptoms improve - Flare up of any chronic lung problem, such as asthma - Your symptoms persist longer than 2 weeks

## 2017-11-04 NOTE — Progress Notes (Signed)
Subjective: CC: cough/ wheeze PCP: Bennie PieriniMartin, Mary-Margaret, FNP VHQ:IONGEXHPI:Dawn Valora PiccoloRoney is a 46 y.o. female presenting to clinic today for:  1. Cough/ wheeze  Patient reports intermittent wheeze that started 2 weeks ago.  She reports associated persistent cough and intermittent shortness of breath that worsened on Wednesday.  She reports intermittent nausea without vomiting, subjective fevers and chills.  Denies chest pain, heart palpitations, pleuritic chest pain.  Denies hemoptysis, congestion, rhinorrhea, sinus pressure, headache, dizziness, rash, vomiting, diarrhea, fevers, chills, myalgia, sick contacts, recent travel, LE swelling or calf pain.  Patient has used LawyerTessalon Perles with little relief of symptoms.  Denies history of COPD or asthma.  No tobacco use/ exposure.  ROS: Per HPI  Allergies  Allergen Reactions  . Ace Inhibitors Cough  . Famvir [Famciclovir] Hives and Swelling    Throat closes   . Tetanus Toxoids     Swelling at sight   Past Medical History:  Diagnosis Date  . Anxiety   . Bipolar 1 disorder (HCC)   . GERD (gastroesophageal reflux disease)   . H/O sinus surgery   . Hyperlipidemia   . Hypertension   . Thyroid disease     Current Outpatient Medications:  .  betamethasone dipropionate (DIPROLENE) 0.05 % cream, Apply topically 2 (two) times daily., Disp: 30 g, Rfl: 3 .  cetirizine (ZYRTEC) 10 MG tablet, TAKE 1 CAPSULE (10 MG TOTAL) BY MOUTH DAILY., Disp: 30 tablet, Rfl: 5 .  Cholecalciferol (VITAMIN D) 2000 UNITS CAPS, Take by mouth., Disp: , Rfl:  .  clonazePAM (KLONOPIN) 0.5 MG tablet, Take 1 tablet (0.5 mg total) by mouth 2 (two) times daily., Disp: 60 tablet, Rfl: 2 .  fluticasone (FLONASE) 50 MCG/ACT nasal spray, INSTILL 1 SPRAY INTO EACH NOSTRIL 2 TIMES DAILY, Disp: 16 g, Rfl: 2 .  hydrochlorothiazide (HYDRODIURIL) 25 MG tablet, Take 1 tablet (25 mg total) by mouth daily., Disp: 30 tablet, Rfl: 5 .  lamoTRIgine (LAMICTAL) 100 MG tablet, Take 1 tablet (100 mg  total) by mouth daily., Disp: 60 tablet, Rfl: 5 .  levothyroxine (SYNTHROID, LEVOTHROID) 88 MCG tablet, TAKE 1 TABLET (88 MCG TOTAL) BY MOUTH DAILY., Disp: 90 tablet, Rfl: 1 .  losartan (COZAAR) 100 MG tablet, Take 1 tablet (100 mg total) by mouth daily., Disp: 30 tablet, Rfl: 5 .  MedroxyPROGESTERone Acetate 150 MG/ML SUSY, Inject 1 mL (150 mg total) into the muscle every 3 (three) months., Disp: 1 Syringe, Rfl: 3 .  naproxen (NAPROSYN) 500 MG tablet, TAKE 1 TABLET (500 MG TOTAL) BY MOUTH 2 (TWO) TIMES DAILY WITH A MEAL., Disp: 60 tablet, Rfl: 0 .  ranitidine (ZANTAC) 150 MG tablet, Take 1 tablet (150 mg total) by mouth 2 (two) times daily., Disp: 60 tablet, Rfl: 3 .  triamcinolone cream (KENALOG) 0.1 %, Apply 1 application topically 2 (two) times daily., Disp: 30 g, Rfl: 0 Social History   Socioeconomic History  . Marital status: Divorced    Spouse name: Not on file  . Number of children: Not on file  . Years of education: Not on file  . Highest education level: Not on file  Social Needs  . Financial resource strain: Not on file  . Food insecurity - worry: Not on file  . Food insecurity - inability: Not on file  . Transportation needs - medical: Not on file  . Transportation needs - non-medical: Not on file  Occupational History  . Not on file  Tobacco Use  . Smoking status: Never Smoker  . Smokeless  tobacco: Never Used  Substance and Sexual Activity  . Alcohol use: No  . Drug use: No  . Sexual activity: Not on file  Other Topics Concern  . Not on file  Social History Narrative  . Not on file   Family History  Problem Relation Age of Onset  . Hypertension Mother   . Cancer Mother        skin,face  . Hypertension Father   . Hyperlipidemia Father     Objective: Office vital signs reviewed. BP 132/82   Pulse (!) 116   Temp 99.4 F (37.4 C) (Oral)   Ht 5\' 1"  (1.549 m)   Wt 275 lb (124.7 kg)   SpO2 96%   BMI 51.96 kg/m   Physical Examination:  General: Awake,  alert, morbidly obese, No acute distress HEENT: Normal    Neck: No masses palpated. No lymphadenopathy    Ears: Tympanic membranes intact, normal light reflex, no erythema, no bulging    Eyes: PERRLA, extraocular membranes intact, sclera white, no discharge    Nose: nasal turbinates moist, mildly erythematous and edematous.  Clear nasal discharge    Throat: moist mucus membranes, no erythema, no tonsillar exudate.  Airway is patent Cardio: mildly tachycardic w/ normal rhythm, S1S2 heard, no murmurs appreciated Pulm: clear to auscultation bilaterally, no wheezes, rhonchi or rales; moves good air. normal work of breathing on room air  Dg Chest 2 View  Result Date: 11/04/2017 CLINICAL DATA:  Cough and congestion. EXAM: CHEST  2 VIEW COMPARISON:  11/16/2016 . FINDINGS: Right-sided aortic arch again noted. Heart size normal. No focal infiltrate. No pleural effusion or pneumothorax. Degenerative change thoracic spine. IMPRESSION: Right-sided aortic arch again noted. No acute cardiopulmonary disease. Chest is stable from prior exam. Electronically Signed   By: Maisie Fus  Register   On: 11/04/2017 10:09    Assessment/ Plan: 46 y.o. female   1. Shortness of breath Patient is tachycardic with a low-grade temperature to 99.4 degrees here in office.  She has normal respiratory rate with audible wheezes.  Pulse ox 96% on room air.  No evidence of DVT on exam.  She has baseline intermittent tachycardia appreciated on several of her last office visits.  Her well score is 1.5 for tachycardia.  Suspicion for PE is low.  Chest x-ray was obtained and on personal review there does appear to be a more prominent left-sided opacity in the lower lobe.  Lateral views were not well interpreted because of poor penetration.  Cannot rule out a possible left lower lobe pneumonia.  Will empirically treat with azithromycin.  Strict return precautions and reasons for emergent evaluation in the emergency department review with  patient.  She voiced understanding and will follow-up as needed. - DG Chest 2 View; Future - CBC with Differential  2. Cough - DG Chest 2 View; Future - CBC with Differential  3. Tachycardia - TSH   Orders Placed This Encounter  Procedures  . DG Chest 2 View    Standing Status:   Future    Number of Occurrences:   1    Standing Expiration Date:   01/05/2019    Order Specific Question:   Reason for Exam (SYMPTOM  OR DIAGNOSIS REQUIRED)    Answer:   wheeze x2 weeks, SOB x3 days    Order Specific Question:   Is patient pregnant?    Answer:   No    Order Specific Question:   Preferred imaging location?    Answer:   Internal  Order Specific Question:   Radiology Contrast Protocol - do NOT remove file path    Answer:   [email or web address]  . CBC with Differential  . TSH   Meds ordered this encounter  Medications  . azithromycin (ZITHROMAX Z-PAK) 250 MG tablet    Sig: As directed    Dispense:  6 tablet    Refill:  0  . albuterol (PROVENTIL HFA;VENTOLIN HFA) 108 (90 Base) MCG/ACT inhaler    Sig: Inhale 2 puffs into the lungs every 6 (six) hours as needed for wheezing or shortness of breath.    Dispense:  1 Inhaler    Refill:  0  . benzonatate (TESSALON) 200 MG capsule    Sig: Take 1 capsule (200 mg total) by mouth 2 (two) times daily as needed for cough.    Dispense:  20 capsule    Refill:  0     Ashly Hulen SkainsM Gottschalk, DO [facility] [phone]

## 2017-11-05 LAB — CBC WITH DIFFERENTIAL/PLATELET
Basophils Absolute: 0 10*3/uL (ref 0.0–0.2)
Basos: 1 %
EOS (ABSOLUTE): 0.2 10*3/uL (ref 0.0–0.4)
EOS: 2 %
HEMATOCRIT: 39.7 % (ref 34.0–46.6)
HEMOGLOBIN: 13.9 g/dL (ref 11.1–15.9)
Immature Grans (Abs): 0 10*3/uL (ref 0.0–0.1)
Immature Granulocytes: 0 %
LYMPHS ABS: 2 10*3/uL (ref 0.7–3.1)
Lymphs: 25 %
MCH: 31.3 pg (ref 26.6–33.0)
MCHC: 35 g/dL (ref 31.5–35.7)
MCV: 89 fL (ref 79–97)
MONOCYTES: 7 %
MONOS ABS: 0.6 10*3/uL (ref 0.1–0.9)
NEUTROS ABS: 5.1 10*3/uL (ref 1.4–7.0)
Neutrophils: 65 %
Platelets: 289 10*3/uL (ref 150–379)
RBC: 4.44 x10E6/uL (ref 3.77–5.28)
RDW: 13.5 % (ref 12.3–15.4)
WBC: 7.9 10*3/uL (ref 3.4–10.8)

## 2017-11-05 LAB — TSH: TSH: 1.59 u[IU]/mL (ref 0.450–4.500)

## 2017-11-10 ENCOUNTER — Ambulatory Visit: Payer: Medicaid Other | Admitting: Nurse Practitioner

## 2017-11-10 IMAGING — DX DG CHEST 2V
2 series · 2 of 2 positions shown · non-contrast
Comparison: None.

CLINICAL DATA: Cough with fever for 1 week

EXAM:
CHEST  2 VIEW

[chest pa]
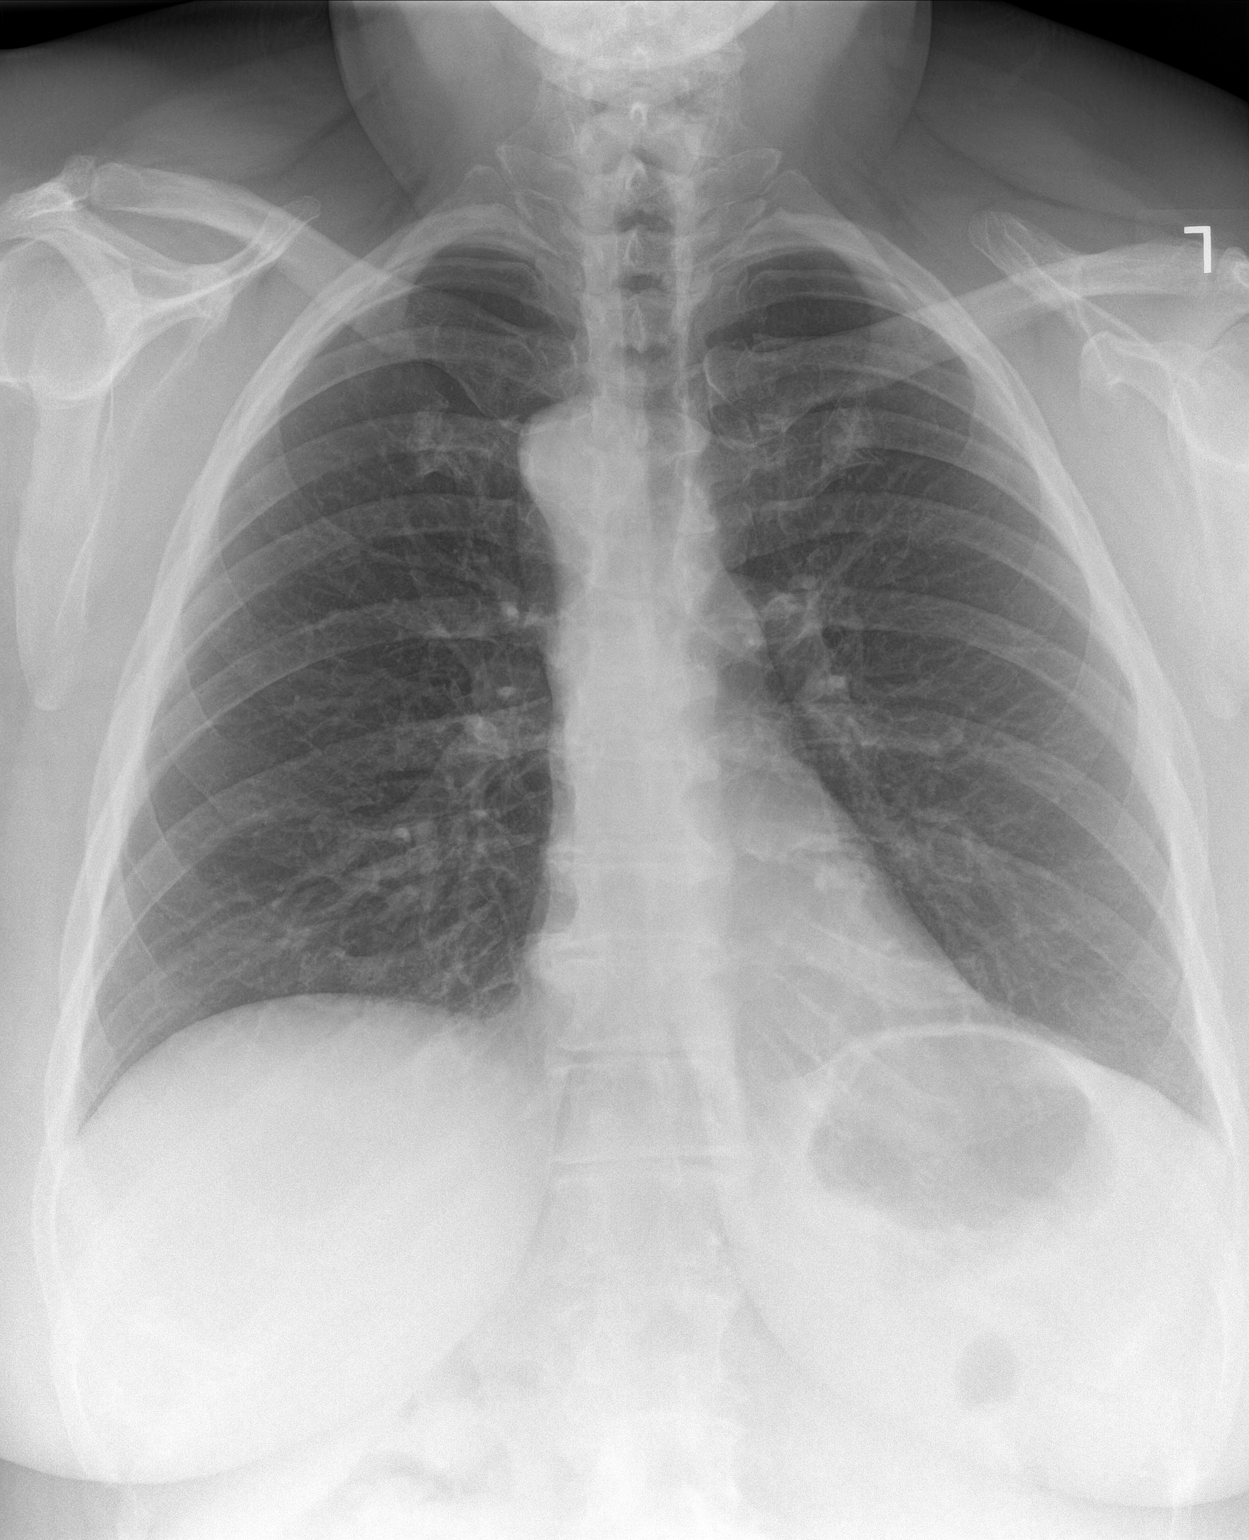

[chest lat]
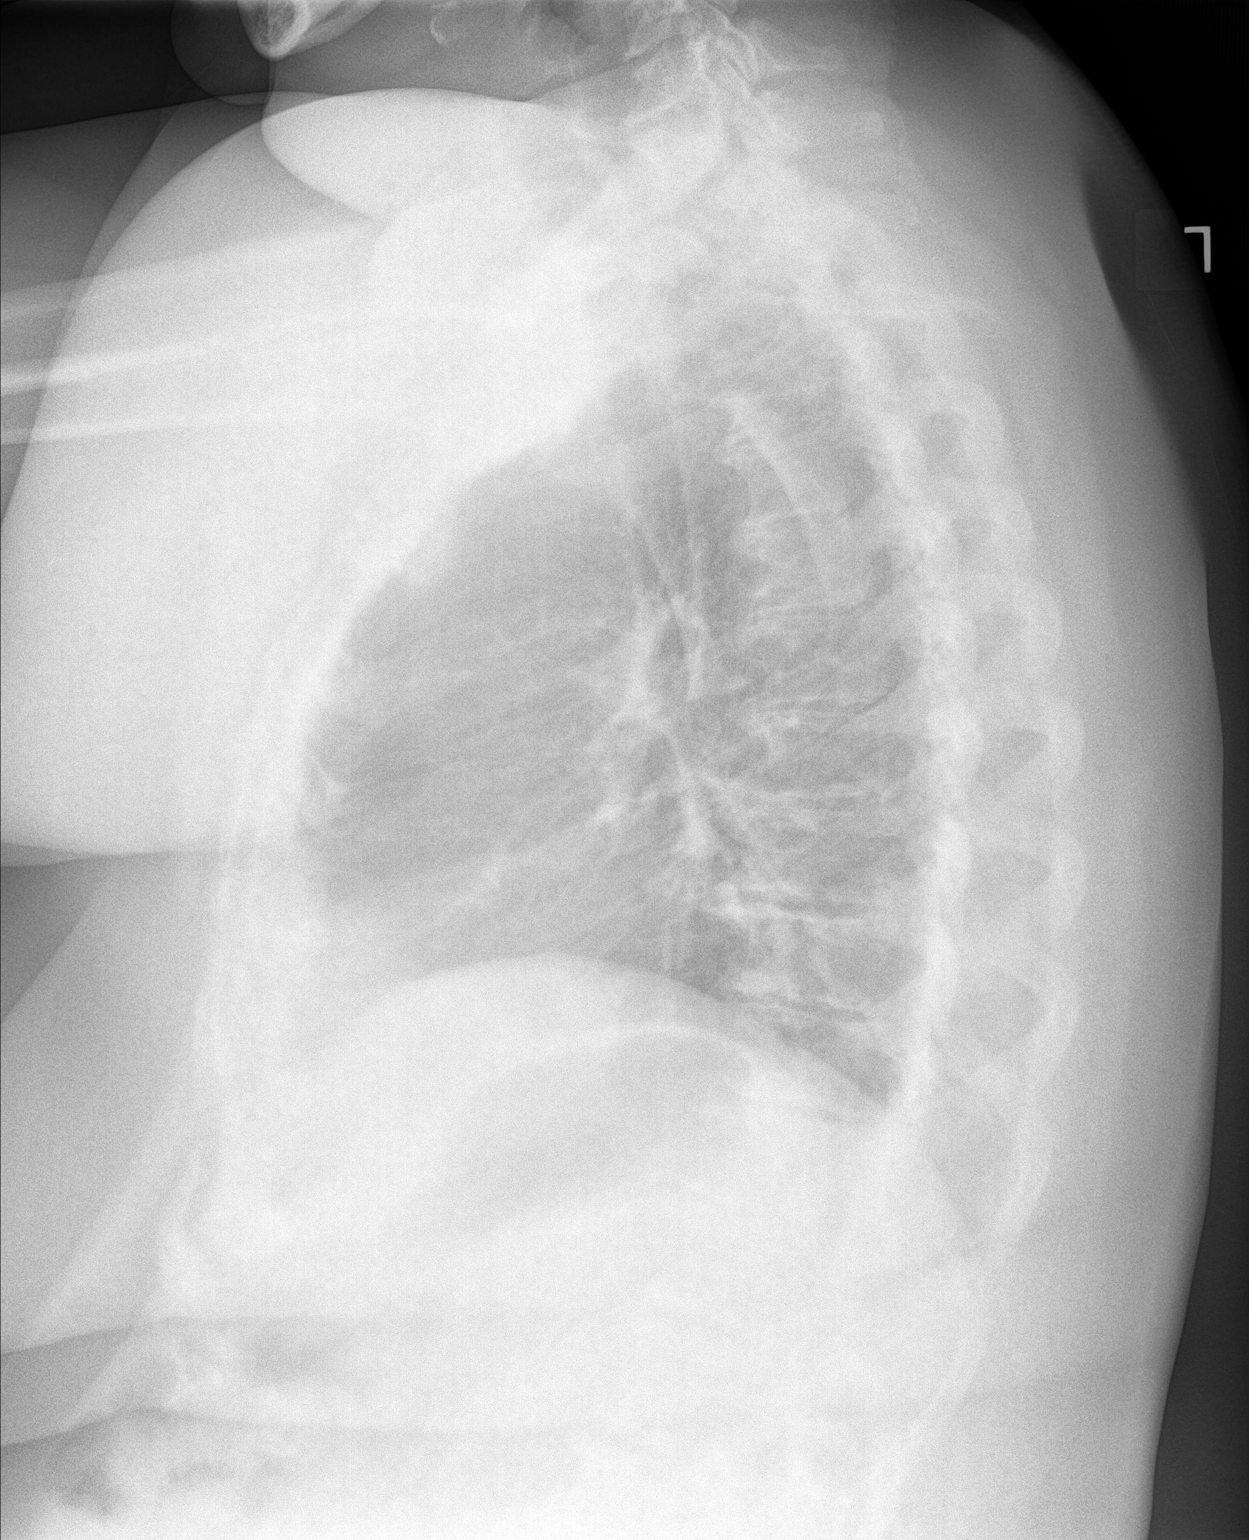

[2 of 2 positions shown; findings below may reference images not displayed]

FINDINGS: There is no edema or consolidation. Heart size and pulmonary
vascularity are normal. There is a right-sided aortic arch. No
adenopathy. There is mild degenerative change in thoracic spine.
IMPRESSION: Right-sided aortic arch.  No edema or consolidation.

## 2017-11-12 ENCOUNTER — Encounter: Payer: Self-pay | Admitting: Family Medicine

## 2017-11-12 ENCOUNTER — Ambulatory Visit: Payer: Medicaid Other | Admitting: Family Medicine

## 2017-11-12 VITALS — BP 131/82 | HR 108 | Temp 98.7°F | Resp 20 | Ht 61.0 in | Wt 274.8 lb

## 2017-11-12 DIAGNOSIS — J0100 Acute maxillary sinusitis, unspecified: Secondary | ICD-10-CM

## 2017-11-12 MED ORDER — METHYLPREDNISOLONE ACETATE 80 MG/ML IJ SUSP
80.0000 mg | Freq: Once | INTRAMUSCULAR | Status: AC
  Administered 2017-11-12: 80 mg via INTRAMUSCULAR

## 2017-11-12 MED ORDER — DOXYCYCLINE HYCLATE 100 MG PO TABS: 100.0000 mg | ORAL_TABLET | Freq: Two times a day (BID) | ORAL | 0 refills | Status: DC

## 2017-11-12 NOTE — Patient Instructions (Signed)
Great to see you!   Sinusitis, Adult Sinusitis is soreness and inflammation of your sinuses. Sinuses are hollow spaces in the bones around your face. They are located:  Around your eyes.  In the middle of your forehead.  Behind your nose.  In your cheekbones.  Your sinuses and nasal passages are lined with a stringy fluid (mucus). Mucus normally drains out of your sinuses. When your nasal tissues get inflamed or swollen, the mucus can get trapped or blocked so air cannot flow through your sinuses. This lets bacteria, viruses, and funguses grow, and that leads to infection. Follow these instructions at home: Medicines  Take, use, or apply over-the-counter and prescription medicines only as told by your doctor. These may include nasal sprays.  If you were prescribed an antibiotic medicine, take it as told by your doctor. Do not stop taking the antibiotic even if you start to feel better. Hydrate and Humidify  Drink enough water to keep your pee (urine) clear or pale yellow.  Use a cool mist humidifier to keep the humidity level in your home above 50%.  Breathe in steam for 10-15 minutes, 3-4 times a day or as told by your doctor. You can do this in the bathroom while a hot shower is running.  Try not to spend time in cool or dry air. Rest  Rest as much as possible.  Sleep with your head raised (elevated).  Make sure to get enough sleep each night. General instructions  Put a warm, moist washcloth on your face 3-4 times a day or as told by your doctor. This will help with discomfort.  Wash your hands often with soap and water. If there is no soap and water, use hand sanitizer.  Do not smoke. Avoid being around people who are smoking (secondhand smoke).  Keep all follow-up visits as told by your doctor. This is important. Contact a doctor if:  You have a fever.  Your symptoms get worse.  Your symptoms do not get better within 10 days. Get help right away if:  You  have a very bad headache.  You cannot stop throwing up (vomiting).  You have pain or swelling around your face or eyes.  You have trouble seeing.  You feel confused.  Your neck is stiff.  You have trouble breathing. This information is not intended to replace advice given to you by your health care provider. Make sure you discuss any questions you have with your health care provider. Document Released: 05/03/2008 Document Revised: 07/11/2016 Document Reviewed: 09/10/2015 Elsevier Interactive Patient Education  2018 [employer] Inc[employer]  

## 2017-11-12 NOTE — Progress Notes (Signed)
   HPI  Patient presents today with cough.  Patient was seen about 1 week ago with cough and elevated heart rate, she was treated with azithromycin.  She states that since she left she feels that her cough is actually worse. She has bad side effects from amoxicillin and wants to avoid that.  In the interim she has developed bilateral maxillary sinus pain and pressure which is worse on the left, she states this is been going on about 6 days.  She is tolerating food and fluids like usual. She is having difficulty sleeping at night due to cough.   PMH: Smoking status noted ROS: Per HPI  Objective: BP 131/82 (BP Location: Left Arm, Patient Position: Sitting, Cuff Size: Normal)   Pulse (!) 108   Temp 98.7 F (37.1 C) (Oral)   Resp 20   Ht 5\' 1"  (1.549 m)   Wt 274 lb 12.8 oz (124.6 kg)   SpO2 97%   BMI 51.92 kg/m  Gen: NAD, alert, cooperative with exam HEENT: NCAT, tenderness to palpation of left maxillary sinus, oropharynx moist and clear CV: RRR, good S1/S2, no murmur Resp: Nonlabored, expiratory wheezes that are faint Ext: No edema, warm Neuro: Alert and oriented, No gross deficits  Assessment and plan:  #Acute maxillary sinusitis With underlying persistent cough Given doxycycline to treat sinus infection, also given IM Depo-Medrol to relieve some of her pressure as well as help the wheezing related cough. Return to clinic as needed  Heart rate appears to be increased on most occasions when she presents to the clinic, recommend follow-up with PCP to discuss this   Meds ordered this encounter  Medications  . doxycycline (VIBRA-TABS) 100 MG tablet    Sig: Take 1 tablet (100 mg total) by mouth 2 (two) times daily. 1 po bid    Dispense:  20 tablet    Refill:  0    Murtis SinkSam Tya Haughey, MD [facility] 11/12/2017, 11:52 AM

## 2017-11-12 NOTE — Addendum Note (Signed)
Addended by: Bearl MulberryUTHERFORD, Kamee Bobst K on: 11/12/2017 12:04 PM   Modules accepted: Orders

## 2017-11-30 ENCOUNTER — Telehealth: Payer: Self-pay | Admitting: Nurse Practitioner

## 2017-11-30 ENCOUNTER — Encounter: Payer: Self-pay | Admitting: Nurse Practitioner

## 2017-11-30 ENCOUNTER — Ambulatory Visit: Payer: Medicaid Other | Admitting: Nurse Practitioner

## 2017-11-30 VITALS — BP 160/107 | HR 108 | Temp 98.1°F | Ht 61.0 in | Wt 274.0 lb

## 2017-11-30 DIAGNOSIS — E032 Hypothyroidism due to medicaments and other exogenous substances: Secondary | ICD-10-CM | POA: Diagnosis not present

## 2017-11-30 DIAGNOSIS — J0101 Acute recurrent maxillary sinusitis: Secondary | ICD-10-CM | POA: Diagnosis not present

## 2017-11-30 DIAGNOSIS — I10 Essential (primary) hypertension: Secondary | ICD-10-CM

## 2017-11-30 DIAGNOSIS — E785 Hyperlipidemia, unspecified: Secondary | ICD-10-CM

## 2017-11-30 DIAGNOSIS — G43711 Chronic migraine without aura, intractable, with status migrainosus: Secondary | ICD-10-CM | POA: Diagnosis not present

## 2017-11-30 DIAGNOSIS — R000 Tachycardia, unspecified: Secondary | ICD-10-CM

## 2017-11-30 DIAGNOSIS — K219 Gastro-esophageal reflux disease without esophagitis: Secondary | ICD-10-CM | POA: Diagnosis not present

## 2017-11-30 DIAGNOSIS — F3162 Bipolar disorder, current episode mixed, moderate: Secondary | ICD-10-CM | POA: Diagnosis not present

## 2017-11-30 DIAGNOSIS — F4001 Agoraphobia with panic disorder: Secondary | ICD-10-CM | POA: Diagnosis not present

## 2017-11-30 MED ORDER — HYDROCHLOROTHIAZIDE 25 MG PO TABS: 25.0000 mg | ORAL_TABLET | Freq: Every day | ORAL | 5 refills | Status: DC

## 2017-11-30 MED ORDER — LOSARTAN POTASSIUM 100 MG PO TABS: 100.0000 mg | ORAL_TABLET | Freq: Every day | ORAL | 5 refills | Status: DC

## 2017-11-30 MED ORDER — CLONAZEPAM 0.5 MG PO TABS: 0.5000 mg | ORAL_TABLET | Freq: Two times a day (BID) | ORAL | 2 refills | Status: DC

## 2017-11-30 MED ORDER — LEVOTHYROXINE SODIUM 88 MCG PO TABS: ORAL_TABLET | ORAL | 1 refills | Status: DC

## 2017-11-30 MED ORDER — LAMOTRIGINE 100 MG PO TABS: 100.0000 mg | ORAL_TABLET | Freq: Every day | ORAL | 5 refills | Status: DC

## 2017-11-30 MED ORDER — LEVOFLOXACIN 500 MG PO TABS: 500.0000 mg | ORAL_TABLET | Freq: Every day | ORAL | 0 refills | Status: DC

## 2017-11-30 MED ORDER — METOPROLOL SUCCINATE ER 50 MG PO TB24
50.0000 mg | ORAL_TABLET | Freq: Every day | ORAL | 1 refills | Status: DC
Start: 2017-11-30 — End: 2018-06-05

## 2017-11-30 MED ORDER — RANITIDINE HCL 150 MG PO TABS: 150.0000 mg | ORAL_TABLET | Freq: Two times a day (BID) | ORAL | 3 refills | Status: DC

## 2017-11-30 NOTE — Patient Instructions (Signed)

## 2017-11-30 NOTE — Progress Notes (Signed)
Subjective:    Patient ID: Dawn Craig, female    DOB: 28-Sep-1971, 47 y.o.   MRN: [id number]  HPI  Dawn Craig is here today for follow up of chronic medical problem.  Outpatient Encounter Medications as of 11/30/2017  Medication Sig  . betamethasone dipropionate (DIPROLENE) 0.05 % cream Apply topically 2 (two) times daily.  . cetirizine (ZYRTEC) 10 MG tablet TAKE 1 CAPSULE (10 MG TOTAL) BY MOUTH DAILY.  Marland Kitchen Cholecalciferol (VITAMIN D) 2000 UNITS CAPS Take by mouth.  . clonazePAM (KLONOPIN) 0.5 MG tablet Take 1 tablet (0.5 mg total) by mouth 2 (two) times daily.  . fluticasone (FLONASE) 50 MCG/ACT nasal spray INSTILL 1 SPRAY INTO EACH NOSTRIL 2 TIMES DAILY  . hydrochlorothiazide (HYDRODIURIL) 25 MG tablet Take 1 tablet (25 mg total) by mouth daily.  Marland Kitchen lamoTRIgine (LAMICTAL) 100 MG tablet Take 1 tablet (100 mg total) by mouth daily.  Marland Kitchen levothyroxine (SYNTHROID, LEVOTHROID) 88 MCG tablet TAKE 1 TABLET (88 MCG TOTAL) BY MOUTH DAILY.  Marland Kitchen losartan (COZAAR) 100 MG tablet Take 1 tablet (100 mg total) by mouth daily.  . MedroxyPROGESTERone Acetate 150 MG/ML SUSY Inject 1 mL (150 mg total) into the muscle every 3 (three) months.  . naproxen (NAPROSYN) 500 MG tablet TAKE 1 TABLET (500 MG TOTAL) BY MOUTH 2 (TWO) TIMES DAILY WITH A MEAL.  . ranitidine (ZANTAC) 150 MG tablet Take 1 tablet (150 mg total) by mouth 2 (two) times daily.  Marland Kitchen triamcinolone cream (KENALOG) 0.1 % Apply 1 application topically 2 (two) times daily.     1. Essential hypertension, benign  No c/o chest pain, sob or headache. Doe snot check blood pressure at home. BP Readings from Last 3 Encounters:  11/30/17 (!) 160/107  11/12/17 131/82  11/04/17 132/82     2. 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 Gastroesophageal reflux disease, esophagitis presence not specified  Takes zantac daily  4. Hypothyroidism due to non-medication exogenous substances  no problems that she is aware of othe rthen she just feels tired all the time with in the last week.  5. Agoraphobia with panic attacks  Still doe snot like to be out in crowds  6. Hyperlipidemia with target LDL less than 100  Is not watching diet  7. Bipolar 1 disorder, mixed, moderate (HCC)  Is currently on lamictal and klonopin and is doing okay. She has tried ssri's and says they do not help. Does not want to take one. Depression screen [facility] 2/9 11/30/2017 11/04/2017 08/11/2017  Decreased Interest _0 Down, Depressed, Hopeless _1 PHQ - 2 Score _2 Altered sleeping _3 Tired, decreased energy _4 Change in appetite _5 Feeling bad or failure about yourself  _6 Trouble concentrating _7 Moving slowly or fidgety/restless _8 Suicidal thoughts _9 PHQ-9 Score _10 Some recent data might be hidden     8. Severe obesity (BMI >= 40) (HCC)  No recent weight changes    New complaints: Congestion cough and bil ear pain that started about 4 days ago. Denies fever  Social history: Lives alone with dog- does not get out of house very often    Review of Systems  Constitutional: Positive for fatigue. Negative for activity change, appetite change, chills and fever.  HENT: Positive for  congestion, ear pain, sinus pressure and sinus pain. Negative for sore throat and trouble swallowing.   Eyes: Negative for pain.  Respiratory: Negative for shortness of breath. Cough: slight.   Cardiovascular: Negative for chest pain, palpitations and leg swelling.  Gastrointestinal: Negative for abdominal pain.  Endocrine: Negative for polydipsia.  Genitourinary: Negative.   Skin: Negative for rash.  Neurological: Negative for dizziness, weakness and headaches.  Hematological: Does not bruise/bleed easily.  Psychiatric/Behavioral: Positive for dysphoric mood.  All other systems reviewed and are negative.      Objective:   Physical Exam  Constitutional: She is  oriented to person, place, and time. She appears well-developed and well-nourished.  HENT:  Right Ear: Hearing, tympanic membrane, external ear and ear canal normal.  Left Ear: Hearing, tympanic membrane, external ear and ear canal normal.  Nose: Mucosal edema and rhinorrhea present. Right sinus exhibits maxillary sinus tenderness. Right sinus exhibits no frontal sinus tenderness. Left sinus exhibits maxillary sinus tenderness.  Mouth/Throat: Uvula is midline and oropharynx is clear and moist.  Eyes: EOM are normal.  Neck: Trachea normal, normal range of motion and full passive range of motion without pain. Neck supple. No JVD present. Carotid bruit is not present. No thyromegaly present.  Cardiovascular: Normal rate, regular rhythm, normal heart sounds and intact distal pulses. Exam reveals no gallop and no friction rub.  No murmur heard. Pulmonary/Chest: Effort normal and breath sounds normal.  Abdominal: Soft. Bowel sounds are normal. She exhibits no distension and no mass. There is no tenderness.  Musculoskeletal: Normal range of motion.  Lymphadenopathy:    She has no cervical adenopathy.  Neurological: She is alert and oriented to person, place, and time. She has normal reflexes.  Skin: Skin is warm and dry.  Psychiatric: She has a normal mood and affect. Her behavior is normal. Judgment and thought content normal.   BP (!) 160/107   Pulse (!) 108   Temp 98.1 F (36.7 C) (Oral)   Ht 5' 1" (1.549 m)   Wt 274 lb (124.3 kg)   BMI 51.77 kg/m   EKG- nsr-Mary-Margaret Hassell Done, FNP       Assessment & Plan:  1. Essential hypertension, benign Low sodium diet - hydrochlorothiazide (HYDRODIURIL) 25 MG tablet; Take 1 tablet (25 mg total) by mouth daily.  Dispense: 30 tablet; Refill: 5 - losartan (COZAAR) 100 MG tablet; Take 1 tablet (100 mg total) by mouth daily.  Dispense: 30 tablet; Refill: 5 - CMP14+EGFR  2. Intractable chronic migraine without aura and with status  migrainosus Continue to keep diary of migraines  3. Gastroesophageal reflux disease, esophagitis presence not specified Avoid spicy foods Do not eat 2 hours prior to bedtime - ranitidine (ZANTAC) 150 MG tablet; Take 1 tablet (150 mg total) by mouth 2 (two) times daily.  Dispense: 60 tablet; Refill: 3  4. 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  Dispense: 90 tablet; Refill: 1 - Thyroid Panel With TSH  5. Agoraphobia with panic attacks Stress management - clonazePAM (KLONOPIN) 0.5 MG tablet; Take 1 tablet (0.5 mg total) by mouth 2 (two) times daily.  Dispense: 60 tablet; Refill: 2  6. Hyperlipidemia with target LDL less than 100 Low fat diet encouraged - Lipid panel  7. Bipolar 1 disorder, mixed, moderate ([place]) patient refuse antidepressant Given list of counselors - lamoTRIgine (LAMICTAL) 100 MG tablet; Take 1 tablet (100 mg total) by mouth daily.  Dispense:  60 tablet; Refill: 5  8. Severe obesity (BMI >= 40) (HCC) Discussed diet and exercise for person with BMI >25 Will recheck weight in 3-6 months  9. Sinus tachycardia Avoid caffeine - EKG 12-Lead - metoprolol succinate (TOPROL-XL) 50 MG 24 hr tablet; Take 1 tablet (50 mg total) by mouth daily. Take with or immediately following a meal.  Dispense: 90 tablet; Refill: 1  10. Acute recurrent maxillary sinusitis 1. Take meds as prescribed 2. Use a cool mist humidifier especially during the winter months and when heat has been humid. 3. Use saline nose sprays frequently 4. Saline irrigations of the nose can be very helpful if done frequently.  * 4X daily for 1 week*  * Use of a nettie pot can be helpful with this. Follow directions with this* 5. Drink plenty of fluids 6. Keep thermostat turn down low 7.For any cough or congestion  Use plain Mucinex- regular strength or max strength is fine   * Children-  consult with Pharmacist for dosing 8. For fever or aces or pains- take tylenol or ibuprofen appropriate for age and weight.  * for fevers greater than 101 orally you may alternate ibuprofen and tylenol every  3 hours.   - CBC with Differential/Platelet    Labs pending Health maintenance reviewed Diet and exercise encouraged Continue all meds Follow up  In 3 months   [place], FNP

## 2017-11-30 NOTE — Telephone Encounter (Signed)
rx resubmitted

## 2017-12-01 LAB — CBC WITH DIFFERENTIAL/PLATELET
BASOS ABS: 0 10*3/uL (ref 0.0–0.2)
BASOS: 0 %
EOS (ABSOLUTE): 0.2 10*3/uL (ref 0.0–0.4)
Eos: 2 %
Hematocrit: 41.4 % (ref 34.0–46.6)
Hemoglobin: 14.4 g/dL (ref 11.1–15.9)
Immature Grans (Abs): 0 10*3/uL (ref 0.0–0.1)
Immature Granulocytes: 0 %
Lymphocytes Absolute: 3.2 10*3/uL — ABNORMAL HIGH (ref 0.7–3.1)
Lymphs: 33 %
MCH: 31.4 pg (ref 26.6–33.0)
MCHC: 34.8 g/dL (ref 31.5–35.7)
MCV: 90 fL (ref 79–97)
Monocytes Absolute: 0.5 10*3/uL (ref 0.1–0.9)
Monocytes: 6 %
NEUTROS ABS: 5.7 10*3/uL (ref 1.4–7.0)
Neutrophils: 59 %
PLATELETS: 304 10*3/uL (ref 150–379)
RBC: 4.58 x10E6/uL (ref 3.77–5.28)
RDW: 13.5 % (ref 12.3–15.4)
WBC: 9.6 10*3/uL (ref 3.4–10.8)

## 2017-12-01 LAB — LIPID PANEL
CHOL/HDL RATIO: 3.8 ratio (ref 0.0–4.4)
Cholesterol, Total: 135 mg/dL (ref 100–199)
HDL: 36 mg/dL — ABNORMAL LOW (ref >39)
LDL Calculated: 80 mg/dL (ref 0–99)
TRIGLYCERIDES: 95 mg/dL (ref 0–149)
VLDL CHOLESTEROL CAL: 19 mg/dL (ref 5–40)

## 2017-12-01 LAB — CMP14+EGFR
A/G RATIO: 1.6 (ref 1.2–2.2)
ALT: 14 IU/L (ref 0–32)
AST: 16 IU/L (ref 0–40)
Albumin: 4.3 g/dL (ref 3.5–5.5)
Alkaline Phosphatase: 76 IU/L (ref 39–117)
BUN/Creatinine Ratio: 11 (ref 9–23)
BUN: 8 mg/dL (ref 6–24)
Bilirubin Total: 0.5 mg/dL (ref 0.0–1.2)
CALCIUM: 10 mg/dL (ref 8.7–10.2)
CHLORIDE: 103 mmol/L (ref 96–106)
CO2: 23 mmol/L (ref 20–29)
Creatinine, Ser: 0.74 mg/dL (ref 0.57–1.00)
GFR calc Af Amer: 112 mL/min/{1.73_m2} (ref >59)
GFR, EST NON AFRICAN AMERICAN: 97 mL/min/{1.73_m2} (ref >59)
GLUCOSE: 111 mg/dL — AB (ref 65–99)
Globulin, Total: 2.7 g/dL (ref 1.5–4.5)
POTASSIUM: 3.6 mmol/L (ref 3.5–5.2)
Sodium: 142 mmol/L (ref 134–144)
TOTAL PROTEIN: 7 g/dL (ref 6.0–8.5)

## 2017-12-01 LAB — THYROID PANEL WITH TSH
FREE THYROXINE INDEX: 2.5 (ref 1.2–4.9)
T3 Uptake Ratio: 27 % (ref 24–39)
T4, Total: 9.3 ug/dL (ref 4.5–12.0)
TSH: 2.44 u[IU]/mL (ref 0.450–4.500)

## 2018-01-26 ENCOUNTER — Ambulatory Visit: Payer: Medicaid Other

## 2018-01-27 ENCOUNTER — Ambulatory Visit (INDEPENDENT_AMBULATORY_CARE_PROVIDER_SITE_OTHER): Payer: Medicaid Other | Admitting: *Deleted

## 2018-01-27 DIAGNOSIS — Z3042 Encounter for surveillance of injectable contraceptive: Secondary | ICD-10-CM

## 2018-01-27 DIAGNOSIS — Z309 Encounter for contraceptive management, unspecified: Secondary | ICD-10-CM

## 2018-01-27 MED ORDER — MEDROXYPROGESTERONE ACETATE 150 MG/ML IM SUSP
150.0000 mg | INTRAMUSCULAR | Status: DC
  Administered 2018-01-27 – 2018-07-12 (×3): 150 mg via INTRAMUSCULAR

## 2018-01-27 NOTE — Progress Notes (Signed)
Pt given Medroxyprogesterone inj Tolerated well 

## 2018-02-02 ENCOUNTER — Other Ambulatory Visit: Payer: Self-pay | Admitting: Family Medicine

## 2018-02-21 ENCOUNTER — Telehealth: Payer: Self-pay | Admitting: Nurse Practitioner
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
## 2018-02-23 ENCOUNTER — Ambulatory Visit (HOSPITAL_COMMUNITY): Payer: Medicaid Other | Admitting: Psychiatry

## 2018-02-23 ENCOUNTER — Encounter (HOSPITAL_COMMUNITY): Payer: Self-pay | Admitting: Psychiatry

## 2018-02-23 VITALS — BP 163/87 | HR 115 | Ht 61.0 in | Wt 282.0 lb

## 2018-02-23 DIAGNOSIS — Z811 Family history of alcohol abuse and dependence: Secondary | ICD-10-CM | POA: Diagnosis not present

## 2018-02-23 DIAGNOSIS — F3132 Bipolar disorder, current episode depressed, moderate: Secondary | ICD-10-CM

## 2018-02-23 DIAGNOSIS — G4700 Insomnia, unspecified: Secondary | ICD-10-CM

## 2018-02-23 DIAGNOSIS — Z818 Family history of other mental and behavioral disorders: Secondary | ICD-10-CM

## 2018-02-23 DIAGNOSIS — R441 Visual hallucinations: Secondary | ICD-10-CM

## 2018-02-23 DIAGNOSIS — R440 Auditory hallucinations: Secondary | ICD-10-CM | POA: Diagnosis not present

## 2018-02-23 DIAGNOSIS — F410 Panic disorder [episodic paroxysmal anxiety] without agoraphobia: Secondary | ICD-10-CM | POA: Diagnosis not present

## 2018-02-23 DIAGNOSIS — R45851 Suicidal ideations: Secondary | ICD-10-CM

## 2018-02-23 DIAGNOSIS — R450 Nervousness: Secondary | ICD-10-CM

## 2018-02-23 DIAGNOSIS — F419 Anxiety disorder, unspecified: Secondary | ICD-10-CM

## 2018-02-23 DIAGNOSIS — Z91411 Personal history of adult psychological abuse: Secondary | ICD-10-CM | POA: Diagnosis not present

## 2018-02-23 MED ORDER — LURASIDONE HCL 20 MG PO TABS: 20.0000 mg | ORAL_TABLET | Freq: Every day | ORAL | 1 refills | Status: DC

## 2018-02-23 NOTE — Patient Instructions (Signed)
1. Start latuda 20 mg daily  2. Continue lamotrigine 100 mg daily  3. Continue clonazepam 0.5 mg daily as needed for anxiety  4. Return to clinic in one month for 30 mins

## 2018-03-02 ENCOUNTER — Encounter: Payer: Self-pay | Admitting: Nurse Practitioner

## 2018-03-02 ENCOUNTER — Ambulatory Visit: Payer: Medicaid Other | Admitting: Nurse Practitioner

## 2018-03-02 VITALS — BP 138/89 | HR 97 | Temp 98.2°F | Ht 61.0 in | Wt 283.0 lb

## 2018-03-02 DIAGNOSIS — E785 Hyperlipidemia, unspecified: Secondary | ICD-10-CM

## 2018-03-02 DIAGNOSIS — F5081 Binge eating disorder: Secondary | ICD-10-CM

## 2018-03-02 DIAGNOSIS — I10 Essential (primary) hypertension: Secondary | ICD-10-CM

## 2018-03-02 DIAGNOSIS — F50819 Binge eating disorder, unspecified: Secondary | ICD-10-CM

## 2018-03-02 DIAGNOSIS — F319 Bipolar disorder, unspecified: Secondary | ICD-10-CM

## 2018-03-02 DIAGNOSIS — E032 Hypothyroidism due to medicaments and other exogenous substances: Secondary | ICD-10-CM

## 2018-03-02 DIAGNOSIS — G43711 Chronic migraine without aura, intractable, with status migrainosus: Secondary | ICD-10-CM | POA: Diagnosis not present

## 2018-03-02 DIAGNOSIS — F4001 Agoraphobia with panic disorder: Secondary | ICD-10-CM

## 2018-03-02 DIAGNOSIS — R739 Hyperglycemia, unspecified: Secondary | ICD-10-CM

## 2018-03-02 DIAGNOSIS — K219 Gastro-esophageal reflux disease without esophagitis: Secondary | ICD-10-CM

## 2018-03-02 DIAGNOSIS — N3941 Urge incontinence: Secondary | ICD-10-CM

## 2018-03-02 MED ORDER — FLUTICASONE PROPIONATE 50 MCG/ACT NA SUSP: NASAL | 2 refills | Status: DC

## 2018-03-02 NOTE — Progress Notes (Signed)
Subjective:    Patient ID: Dawn Craig, female    DOB: Sep 03, 1971, 47 y.o.   MRN: [id number]  HPI   Dawn Craig is here today for follow up of chronic medical problem.  Outpatient Encounter Medications as of 03/02/2018  Medication Sig  . betamethasone dipropionate (DIPROLENE) 0.05 % cream Apply topically 2 (two) times daily.  . cetirizine (ZYRTEC) 10 MG tablet TAKE 1 TABLET BY MOUTH EVERY DAY  . Cholecalciferol (VITAMIN D) 2000 UNITS CAPS Take by mouth.  . clonazePAM (KLONOPIN) 0.5 MG tablet Take 1 tablet (0.5 mg total) by mouth 2 (two) times daily.  . fluticasone (FLONASE) 50 MCG/ACT nasal spray INSTILL 1 SPRAY INTO EACH NOSTRIL 2 TIMES DAILY  . hydrochlorothiazide (HYDRODIURIL) 25 MG tablet Take 1 tablet (25 mg total) by mouth daily.  Marland Kitchen lamoTRIgine (LAMICTAL) 100 MG tablet Take 1 tablet (100 mg total) by mouth daily.  Marland Kitchen levothyroxine (SYNTHROID, LEVOTHROID) 88 MCG tablet TAKE 1 TABLET (88 MCG TOTAL) BY MOUTH DAILY.  Marland Kitchen losartan (COZAAR) 100 MG tablet Take 1 tablet (100 mg total) by mouth daily.  Marland Kitchen lurasidone (LATUDA) 20 MG TABS tablet Take 1 tablet (20 mg total) by mouth daily.  . MedroxyPROGESTERone Acetate 150 MG/ML SUSY Inject 1 mL (150 mg total) into the muscle every 3 (three) months.  . metoprolol succinate (TOPROL-XL) 50 MG 24 hr tablet Take 1 tablet (50 mg total) by mouth daily. Take with or immediately following a meal.  . naproxen (NAPROSYN) 500 MG tablet TAKE 1 TABLET (500 MG TOTAL) BY MOUTH 2 (TWO) TIMES DAILY WITH A MEAL.  . ranitidine (ZANTAC) 150 MG tablet Take 1 tablet (150 mg total) by mouth 2 (two) times daily.  Marland Kitchen triamcinolone cream (KENALOG) 0.1 % Apply 1 application topically 2 (two) times daily.    1. 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  2. Essential hypertension, benign  No c/o chest pain, sob or headache. Does not check blood pressures at home. BP Readings from Last 3 Encounters:  03/02/18 (!)  135/92  11/30/17 (!) 160/107  11/12/17 131/82     3. Gastroesophageal reflux disease, esophagitis presence not specified  She takes zantac as needed- she thinks a lot of her symptoms could be from over eating.  4. 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 Severe obesity (BMI >= 40) (HCC)  No recent weight changes  6. Urge incontinence of urine  Usually just dribble- no full blown accidents  7. Hyperlipidemia with target LDL less than 100  Does not watch diet  8. Binge eating disorder  She over eats every day. somethings she just cannot stop eating until all gone  9. Agoraphobia with panic attacks  Does not like being out in public around people.   10.    Bipolar          Is on lamitcal  which usually  keeps her mood swings under control.  Has more           depression then manic episodes. Is seeing psych now an dthey started her on          latuda  1 week ago and she says that she thinks it is helping Depression screen [facility] 2/9 03/02/2018 11/30/2017 11/04/2017  Decreased Interest '3 3 3  ' Down, Depressed, Hopeless '3 2 2  ' PHQ - 2 Score '6 5 5  ' Altered sleeping 3 2 2  Tired, decreased energy '3 3 3  ' Change in appetite '3 3 3  ' Feeling bad or failure about yourself  '3 3 3  ' Trouble concentrating '3 2 2  ' Moving slowly or fidgety/restless '3 2 2  ' Suicidal thoughts '3 2 2  ' PHQ-9 Score '27 22 22  ' Some recent data might be hidden      New complaints: None today  Social history: Lives alone. Has a son that lives several hours away that she does not get to see very often. He does call her frequently.  Review of Systems  Constitutional: Negative for activity change and appetite change.  HENT: Negative.   Eyes: Negative for pain.  Respiratory: Negative for shortness of breath.   Cardiovascular: Negative for chest pain, palpitations and leg swelling.  Gastrointestinal: Negative for abdominal pain.  Endocrine: Negative for polydipsia.    Genitourinary: Negative.   Skin: Negative for rash.  Neurological: Negative for dizziness, weakness and headaches.  Hematological: Does not bruise/bleed easily.  Psychiatric/Behavioral: Negative.   All other systems reviewed and are negative.      Objective:   Physical Exam  Constitutional: She is oriented to person, place, and time. She appears well-developed and well-nourished.  HENT:  Nose: Nose normal.  Mouth/Throat: Oropharynx is clear and moist.  Eyes: EOM are normal.  Neck: Trachea normal, normal range of motion and full passive range of motion without pain. Neck supple. No JVD present. Carotid bruit is not present. No thyromegaly present.  Cardiovascular: Normal rate, regular rhythm, normal heart sounds and intact distal pulses. Exam reveals no gallop and no friction rub.  No murmur heard. Pulmonary/Chest: Effort normal and breath sounds normal.  Abdominal: Soft. Bowel sounds are normal. She exhibits no distension and no mass. There is no tenderness.  Musculoskeletal: Normal range of motion.  Lymphadenopathy:    She has no cervical adenopathy.  Neurological: She is alert and oriented to person, place, and time. She has normal reflexes.  Skin: Skin is warm and dry.  Psychiatric: She has a normal mood and affect. Her behavior is normal. Judgment and thought content normal.  Flat affect   BP 138/89 (BP Location: Left Arm, Cuff Size: Large)   Pulse 97   Temp 98.2 F (36.8 C) (Oral)   Ht '5\' 1"'  (1.549 m)   Wt 283 lb (128.4 kg)   BMI 53.47 kg/m       Assessment & Plan:  1. 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 Essential hypertension, benign Low sodium diet - CMP14+EGFR  3. Gastroesophageal reflux disease, esophagitis presence not specified Avoid spicy foods Do not eat 2 hours prior to bedtime  4. 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 Severe obesity (BMI >= 40)  (HCC) Discussed diet and exercise for person with BMI >25 Will recheck weight in 3-6 months  6. Urge incontinence of urine  7. Hyperlipidemia with target LDL less than 100 Low fat diet - Lipid panel  8. Binge eating disorder At least try to start exercisine  9. Agoraphobia with panic attacks Hopefully counseling and latuda will help  10. Bipolar 1 disorder ([place]) Continue counseling    Labs pending Health maintenance reviewed Diet and exercise encouraged Continue all meds Follow up  In 3 months   [place], FNP

## 2018-03-03 LAB — THYROID PANEL WITH TSH
Free Thyroxine Index: 2.3 (ref 1.2–4.9)
T3 UPTAKE RATIO: 26 % (ref 24–39)
T4 TOTAL: 8.8 ug/dL (ref 4.5–12.0)
TSH: 2.87 u[IU]/mL (ref 0.450–4.500)

## 2018-03-03 LAB — LIPID PANEL
CHOLESTEROL TOTAL: 151 mg/dL (ref 100–199)
Chol/HDL Ratio: 5 ratio — ABNORMAL HIGH (ref 0.0–4.4)
HDL: 30 mg/dL — ABNORMAL LOW (ref >39)
LDL Calculated: 84 mg/dL (ref 0–99)
Triglycerides: 185 mg/dL — ABNORMAL HIGH (ref 0–149)
VLDL Cholesterol Cal: 37 mg/dL (ref 5–40)

## 2018-03-03 LAB — CMP14+EGFR
ALBUMIN: 4.6 g/dL (ref 3.5–5.5)
ALK PHOS: 75 IU/L (ref 39–117)
ALT: 17 IU/L (ref 0–32)
AST: 19 IU/L (ref 0–40)
Albumin/Globulin Ratio: 2 (ref 1.2–2.2)
BUN / CREAT RATIO: 10 (ref 9–23)
BUN: 8 mg/dL (ref 6–24)
Bilirubin Total: 0.6 mg/dL (ref 0.0–1.2)
CO2: 24 mmol/L (ref 20–29)
CREATININE: 0.78 mg/dL (ref 0.57–1.00)
Calcium: 9.8 mg/dL (ref 8.7–10.2)
Chloride: 101 mmol/L (ref 96–106)
GFR calc Af Amer: 105 mL/min/{1.73_m2} (ref >59)
GFR calc non Af Amer: 91 mL/min/{1.73_m2} (ref >59)
GLUCOSE: 114 mg/dL — AB (ref 65–99)
Globulin, Total: 2.3 g/dL (ref 1.5–4.5)
Potassium: 4 mmol/L (ref 3.5–5.2)
Sodium: 140 mmol/L (ref 134–144)
Total Protein: 6.9 g/dL (ref 6.0–8.5)

## 2018-03-06 NOTE — Addendum Note (Signed)
Addended by: Bennie PieriniMARTIN, MARY-MARGARET on: 03/06/2018 11:50 AM   Modules accepted: Orders

## 2018-03-07 LAB — BAYER DCA HB A1C WAIVED: HB A1C (BAYER DCA - WAIVED): 6 % (ref <7.0)
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
## 2018-04-04 ENCOUNTER — Ambulatory Visit (HOSPITAL_COMMUNITY): Payer: Medicaid Other | Admitting: Psychiatry

## 2018-04-04 ENCOUNTER — Encounter (HOSPITAL_COMMUNITY): Payer: Self-pay | Admitting: Psychiatry

## 2018-04-04 ENCOUNTER — Telehealth (HOSPITAL_COMMUNITY): Payer: Self-pay | Admitting: Psychiatry

## 2018-04-04 VITALS — BP 110/68 | HR 104 | Ht 61.0 in | Wt 282.0 lb

## 2018-04-04 DIAGNOSIS — F3181 Bipolar II disorder: Secondary | ICD-10-CM | POA: Insufficient documentation

## 2018-04-04 DIAGNOSIS — R450 Nervousness: Secondary | ICD-10-CM

## 2018-04-04 DIAGNOSIS — F5081 Binge eating disorder: Secondary | ICD-10-CM

## 2018-04-04 DIAGNOSIS — F419 Anxiety disorder, unspecified: Secondary | ICD-10-CM

## 2018-04-04 DIAGNOSIS — Z915 Personal history of self-harm: Secondary | ICD-10-CM | POA: Diagnosis not present

## 2018-04-04 DIAGNOSIS — F1099 Alcohol use, unspecified with unspecified alcohol-induced disorder: Secondary | ICD-10-CM

## 2018-04-04 DIAGNOSIS — Z818 Family history of other mental and behavioral disorders: Secondary | ICD-10-CM

## 2018-04-04 MED ORDER — LURASIDONE HCL 20 MG PO TABS
20.0000 mg | ORAL_TABLET | Freq: Every day | ORAL | 0 refills | Status: DC
Start: 2018-04-04 — End: 2018-05-05

## 2018-04-04 MED ORDER — SERTRALINE HCL 50 MG PO TABS: ORAL_TABLET | ORAL | 0 refills | Status: DC

## 2018-04-04 NOTE — Patient Instructions (Signed)
1. Continue latuda 20 mg at nmnight 2. Start sertraline 25 mg at night for one weeks, the 50 mg at night  2. Continue lamotrigine 100 mg daily  3. Continue clonazepam 0.5 mg daily as needed for anxiety  4. Return to clinic in one month for 30 mins

## 2018-04-04 NOTE — Telephone Encounter (Signed)
Reviewed chart from general adult & geriatric psychiatry. Noted dated from 2001- 2013. 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 Patient mainly complained of anxiety and depression during those visits.

## 2018-04-17 ENCOUNTER — Other Ambulatory Visit: Payer: Self-pay | Admitting: Nurse Practitioner

## 2018-04-20 ENCOUNTER — Ambulatory Visit (INDEPENDENT_AMBULATORY_CARE_PROVIDER_SITE_OTHER): Payer: Medicaid Other | Admitting: *Deleted

## 2018-04-20 DIAGNOSIS — Z3042 Encounter for surveillance of injectable contraceptive: Secondary | ICD-10-CM

## 2018-04-20 DIAGNOSIS — Z309 Encounter for contraceptive management, unspecified: Secondary | ICD-10-CM

## 2018-04-20 NOTE — Progress Notes (Signed)
Pt given Medroxyprogesterone inj Tolerated well 
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
## 2018-05-05 ENCOUNTER — Ambulatory Visit (HOSPITAL_COMMUNITY): Payer: Medicaid Other | Admitting: Psychiatry

## 2018-05-05 VITALS — BP 115/73 | HR 95 | Ht 61.0 in | Wt 277.0 lb

## 2018-05-05 DIAGNOSIS — G4700 Insomnia, unspecified: Secondary | ICD-10-CM

## 2018-05-05 DIAGNOSIS — Z818 Family history of other mental and behavioral disorders: Secondary | ICD-10-CM

## 2018-05-05 DIAGNOSIS — F5081 Binge eating disorder: Secondary | ICD-10-CM

## 2018-05-05 DIAGNOSIS — I10 Essential (primary) hypertension: Secondary | ICD-10-CM | POA: Diagnosis not present

## 2018-05-05 DIAGNOSIS — E039 Hypothyroidism, unspecified: Secondary | ICD-10-CM | POA: Diagnosis not present

## 2018-05-05 DIAGNOSIS — Z560 Unemployment, unspecified: Secondary | ICD-10-CM

## 2018-05-05 DIAGNOSIS — G43909 Migraine, unspecified, not intractable, without status migrainosus: Secondary | ICD-10-CM

## 2018-05-05 DIAGNOSIS — F410 Panic disorder [episodic paroxysmal anxiety] without agoraphobia: Secondary | ICD-10-CM

## 2018-05-05 DIAGNOSIS — R454 Irritability and anger: Secondary | ICD-10-CM

## 2018-05-05 DIAGNOSIS — Z6841 Body Mass Index (BMI) 40.0 and over, adult: Secondary | ICD-10-CM

## 2018-05-05 DIAGNOSIS — F3181 Bipolar II disorder: Secondary | ICD-10-CM

## 2018-05-05 DIAGNOSIS — R450 Nervousness: Secondary | ICD-10-CM

## 2018-05-05 DIAGNOSIS — Z91410 Personal history of adult physical and sexual abuse: Secondary | ICD-10-CM

## 2018-05-05 DIAGNOSIS — F1099 Alcohol use, unspecified with unspecified alcohol-induced disorder: Secondary | ICD-10-CM

## 2018-05-05 DIAGNOSIS — F419 Anxiety disorder, unspecified: Secondary | ICD-10-CM | POA: Diagnosis not present

## 2018-05-05 DIAGNOSIS — Z915 Personal history of self-harm: Secondary | ICD-10-CM

## 2018-05-05 DIAGNOSIS — K219 Gastro-esophageal reflux disease without esophagitis: Secondary | ICD-10-CM

## 2018-05-05 DIAGNOSIS — E785 Hyperlipidemia, unspecified: Secondary | ICD-10-CM

## 2018-05-05 DIAGNOSIS — R45851 Suicidal ideations: Secondary | ICD-10-CM

## 2018-05-05 DIAGNOSIS — G8929 Other chronic pain: Secondary | ICD-10-CM

## 2018-05-05 DIAGNOSIS — Z6379 Other stressful life events affecting family and household: Secondary | ICD-10-CM

## 2018-05-05 MED ORDER — LURASIDONE HCL 40 MG PO TABS: 40.0000 mg | ORAL_TABLET | Freq: Every day | ORAL | 0 refills | Status: DC

## 2018-05-05 MED ORDER — SERTRALINE HCL 50 MG PO TABS: 50.0000 mg | ORAL_TABLET | Freq: Every day | ORAL | 0 refills | Status: DC

## 2018-05-05 NOTE — Patient Instructions (Signed)
1. Increase latuda 40 mg at night 2. Continue sertraline 50 mg at night  2. Continue lamotrigine 100 mg daily  3. Continue clonazepam 0.5 mg daily as needed for anxiety 4.Return to clinic in one month for 30 mins

## 2018-05-16 ENCOUNTER — Ambulatory Visit: Payer: Medicaid Other | Admitting: Family Medicine

## 2018-05-16 ENCOUNTER — Encounter: Payer: Self-pay | Admitting: Family Medicine

## 2018-05-16 VITALS — BP 112/68 | Temp 98.4°F | Ht 61.0 in | Wt 281.0 lb

## 2018-05-16 DIAGNOSIS — L304 Erythema intertrigo: Secondary | ICD-10-CM

## 2018-05-16 MED ORDER — KETOCONAZOLE 2 % EX CREA: 1.0000 "application " | TOPICAL_CREAM | Freq: Every day | CUTANEOUS | 1 refills | Status: DC

## 2018-05-16 NOTE — Patient Instructions (Signed)
The rash under your breast is fungal and related to excess moisture accumulation and friction.  I have prescribed ketoconazole to apply once daily to the affected area for the next 2 to 3 weeks.  We discussed reasons for return evaluation and signs and symptoms of secondary bacterial infection.  Keep the area as dry as possible.   Intertrigo Intertrigo is skin irritation or inflammation (dermatitis) that occurs when folds of skin rub together. The irritation can cause a rash and make skin raw and itchy. This condition most commonly occurs in the skin folds of these areas:  Toes.  Armpits.  Groin.  Belly.  Breasts.  Buttocks.  Intertrigo is not passed from person to person (is not contagious). What are the causes? This condition is caused by heat, moisture, friction, and lack of air circulation. The condition can be made worse by:  Sweat.  Bacteria or a fungus, such as yeast.  What increases the risk? This condition is more likely to occur if you have moisture in your skin folds. It is also more likely to develop in people who:  Have diabetes.  Are overweight.  Are on bed rest.  Live in a warm and moist climate.  Wear splints, braces, or other medical devices.  Are not able to control their bowels or bladder (have incontinence).  What are the signs or symptoms? Symptoms of this condition include:  A pink or red skin rash.  Brown patches on the skin.  Raw or scaly skin.  Itchiness.  A burning feeling.  Bleeding.  Leaking fluid.  A bad smell.  How is this diagnosed? This condition is diagnosed with a medical history and physical exam. You may also have a skin swab to test for bacteria or a fungus, such as yeast. How is this treated? Treatment may include:  Cleaning and drying your skin.  An oral antibiotic medicine or antibiotic skin cream for a bacterial infection.  Antifungal cream or pills for an infection that was caused by a fungus, such as  yeast.  Steroid ointment to relieve itchiness and irritation.  Follow these instructions at home:  Keep the affected area clean and dry.  Do not scratch your skin.  Stay in a cool environment as much as possible. Use an air conditioner or fan, if available.  Apply over-the-counter and prescription medicines only as told by your health care provider.  If you were prescribed an antibiotic medicine, use it as told by your health care provider. Do not stop using the antibiotic even if your condition improves.  Keep all follow-up visits as told by your health care provider. This is important. How is this prevented?  Maintain a healthy weight.  Take care of your feet, especially if you have diabetes. Foot care includes: ? Wearing shoes that fit well. ? Keeping your feet dry. ? Wearing clean, breathable socks.  Protect the skin around your groin and buttocks, especially if you have incontinence. Skin protection includes: ? Following a regular cleaning routine. ? Using moisturizers and skin protectants. ? Changing protection pads frequently.  Do not wear tight clothes. Wear clothes that are loose and absorbent. Wear clothes that are made of cotton.  Wear a bra that gives good support, if needed.  Shower and dry yourself thoroughly after activity. Use a hair dryer on a cool setting to dry between skin folds, especially after you bathe.  If you have diabetes, keep your blood sugar under control. Contact a health care provider if:  Your symptoms  do not improve with treatment.  Your symptoms get worse or they spread.  You notice increased redness and warmth.  You have a fever. This information is not intended to replace advice given to you by your health care provider. Make sure you discuss any questions you have with your health care provider. Document Released: 11/15/2005 Document Revised: 04/22/2016 Document Reviewed: 05/19/2015 Elsevier Interactive Patient Education  2018  [employer]

## 2018-05-16 NOTE — Progress Notes (Signed)
Subjective: CC: Rash PCP: Bennie Pierini, FNP Dawn Craig is a 47 y.o. female presenting to clinic today for:  1. Rash Patient reports development of itchy and now tender rash along her bra line underneath the left breast on Tuesday of last week.  She notes that it is slowly becoming larger and spreading further underneath her breast.  She notes that she sweats quite a bit at baseline but was not convinced that this was a fungal rash because it had no odor.  Denies any fevers, chills, purulence from the lesions.  She thought that perhaps there was some vesicular formation but was not sure.  She has not been applying any treatments to the affected area because she was unsure as to what exactly it was.  She has been treated for intertrigo in the past with Nystop powders and antifungals over-the-counter.  Of note patient is also on Lamictal.  No rash elsewhere.   ROS: Per HPI  Allergies  Allergen Reactions  . Ace Inhibitors Cough  . Famvir [Famciclovir] Hives and Swelling    Throat closes   . Tetanus Toxoids     Swelling at sight   Past Medical History:  Diagnosis Date  . Anxiety   . Bipolar 1 disorder (HCC)   . GERD (gastroesophageal reflux disease)   . H/O sinus surgery   . Hyperlipidemia   . Hypertension   . Thyroid disease     Current Outpatient Medications:  .  betamethasone dipropionate (DIPROLENE) 0.05 % cream, Apply topically 2 (two) times daily., Disp: 30 g, Rfl: 3 .  cetirizine (ZYRTEC) 10 MG tablet, TAKE 1 TABLET BY MOUTH EVERY DAY, Disp: 90 tablet, Rfl: 1 .  Cholecalciferol (VITAMIN D) 2000 UNITS CAPS, Take by mouth., Disp: , Rfl:  .  clonazePAM (KLONOPIN) 0.5 MG tablet, Take 1 tablet (0.5 mg total) by mouth 2 (two) times daily., Disp: 60 tablet, Rfl: 2 .  fluticasone (FLONASE) 50 MCG/ACT nasal spray, INSTILL 1 SPRAY INTO EACH NOSTRIL 2 TIMES DAILY, Disp: 16 g, Rfl: 2 .  hydrochlorothiazide (HYDRODIURIL) 25 MG tablet, Take 1 tablet (25 mg total) by mouth  daily., Disp: 30 tablet, Rfl: 5 .  lamoTRIgine (LAMICTAL) 100 MG tablet, Take 1 tablet (100 mg total) by mouth daily., Disp: 60 tablet, Rfl: 5 .  levothyroxine (SYNTHROID, LEVOTHROID) 88 MCG tablet, TAKE 1 TABLET (88 MCG TOTAL) BY MOUTH DAILY., Disp: 90 tablet, Rfl: 1 .  losartan (COZAAR) 100 MG tablet, Take 1 tablet (100 mg total) by mouth daily., Disp: 30 tablet, Rfl: 5 .  lurasidone (LATUDA) 40 MG TABS tablet, Take 1 tablet (40 mg total) by mouth daily with breakfast., Disp: 30 tablet, Rfl: 0 .  medroxyPROGESTERone Acetate 150 MG/ML SUSY, INJECT 1 ML (150 MG TOTAL) INTO THE MUSCLE EVERY 3 (THREE) MONTHS., Disp: 1 Syringe, Rfl: 1 .  metoprolol succinate (TOPROL-XL) 50 MG 24 hr tablet, Take 1 tablet (50 mg total) by mouth daily. Take with or immediately following a meal., Disp: 90 tablet, Rfl: 1 .  naproxen (NAPROSYN) 500 MG tablet, TAKE 1 TABLET (500 MG TOTAL) BY MOUTH 2 (TWO) TIMES DAILY WITH A MEAL., Disp: 60 tablet, Rfl: 0 .  ranitidine (ZANTAC) 150 MG tablet, Take 1 tablet (150 mg total) by mouth 2 (two) times daily., Disp: 60 tablet, Rfl: 3 .  sertraline (ZOLOFT) 50 MG tablet, Take 1 tablet (50 mg total) by mouth daily., Disp: 30 tablet, Rfl: 0 .  triamcinolone cream (KENALOG) 0.1 %, Apply 1 application topically 2 (two)  times daily., Disp: 30 g, Rfl: 0  Current Facility-Administered Medications:  .  medroxyPROGESTERone (DEPO-PROVERA) injection 150 mg, 150 mg, Intramuscular, Q90 days, Daphine DeutscherMartin, Mary-Margaret, FNP, 150 mg at 04/20/18 1447 Social History   Socioeconomic History  . Marital status: Divorced    Spouse name: Not on file  . Number of children: Not on file  . Years of education: Not on file  . Highest education level: Not on file  Occupational History  . Not on file  Social Needs  . Financial resource strain: Not on file  . Food insecurity:    Worry: Not on file    Inability: Not on file  . Transportation needs:    Medical: Not on file    Non-medical: Not on file    Tobacco Use  . Smoking status: Never Smoker  . Smokeless tobacco: Never Used  Substance and Sexual Activity  . Alcohol use: No  . Drug use: No  . Sexual activity: Not on file  Lifestyle  . Physical activity:    Days per week: Not on file    Minutes per session: Not on file  . Stress: Not on file  Relationships  . Social connections:    Talks on phone: Not on file    Gets together: Not on file    Attends religious service: Not on file    Active member of club or organization: Not on file    Attends meetings of clubs or organizations: Not on file    Relationship status: Not on file  . Intimate partner violence:    Fear of current or ex partner: Not on file    Emotionally abused: Not on file    Physically abused: Not on file    Forced sexual activity: Not on file  Other Topics Concern  . Not on file  Social History Narrative  . Not on file   Family History  Problem Relation Age of Onset  . Hypertension Mother   . Cancer Mother        skin,face  . Hypertension Father   . Hyperlipidemia Father   . Depression Maternal Aunt   . Bipolar disorder Cousin   . Depression Other   . Suicidality Other     Objective: Office vital signs reviewed. BP 112/68   Temp 98.4 F (36.9 C) (Oral)   Ht 5\' 1"  (1.549 m)   Wt 281 lb (127.5 kg)   BMI 53.09 kg/m   Physical Examination:  General: Awake, alert, obese, No acute distress Skin: Patient with a 20 cm long area of blanching erythema with maceration underneath the left breast.  There are some satellite lesions appreciated.  No purulence or vesicles.  Area is mildly tender to palpation.  Quite a bit of moisture is appreciated under the breast.  Assessment/ Plan: 47 y.o. female   1. Intertriginous dermatitis associated with moisture Clinically consistent with intertrigo likely secondary to candidiasis and excessive moisture.  No evidence of secondary bacterial infection.  I have prescribed her ketoconazole cream to apply  underneath the left breast daily for the next 2 to 3 weeks.  Home care instructions were reviewed.  I encouraged her to keep the area dry and clean.  Return precautions discussed.  Follow-up as needed.   Meds ordered this encounter  Medications  . ketoconazole (NIZORAL) 2 % cream    Sig: Apply 1 application topically daily. x2-3 weeks    Dispense:  60 g    Refill:  1  Dawn Norlander, DO [facility] [phone]

## 2018-06-05 ENCOUNTER — Ambulatory Visit: Payer: Medicaid Other | Admitting: Nurse Practitioner

## 2018-06-05 ENCOUNTER — Encounter: Payer: Self-pay | Admitting: Nurse Practitioner

## 2018-06-05 VITALS — BP 121/84 | HR 95 | Temp 98.0°F | Ht 61.0 in | Wt 278.0 lb

## 2018-06-05 DIAGNOSIS — F3162 Bipolar disorder, current episode mixed, moderate: Secondary | ICD-10-CM

## 2018-06-05 DIAGNOSIS — N3941 Urge incontinence: Secondary | ICD-10-CM | POA: Diagnosis not present

## 2018-06-05 DIAGNOSIS — K219 Gastro-esophageal reflux disease without esophagitis: Secondary | ICD-10-CM

## 2018-06-05 DIAGNOSIS — G43711 Chronic migraine without aura, intractable, with status migrainosus: Secondary | ICD-10-CM | POA: Diagnosis not present

## 2018-06-05 DIAGNOSIS — F4001 Agoraphobia with panic disorder: Secondary | ICD-10-CM

## 2018-06-05 DIAGNOSIS — F3181 Bipolar II disorder: Secondary | ICD-10-CM | POA: Diagnosis not present

## 2018-06-05 DIAGNOSIS — B009 Herpesviral infection, unspecified: Secondary | ICD-10-CM | POA: Diagnosis not present

## 2018-06-05 DIAGNOSIS — E032 Hypothyroidism due to medicaments and other exogenous substances: Secondary | ICD-10-CM

## 2018-06-05 DIAGNOSIS — I10 Essential (primary) hypertension: Secondary | ICD-10-CM | POA: Diagnosis not present

## 2018-06-05 DIAGNOSIS — E785 Hyperlipidemia, unspecified: Secondary | ICD-10-CM | POA: Diagnosis not present

## 2018-06-05 DIAGNOSIS — R000 Tachycardia, unspecified: Secondary | ICD-10-CM

## 2018-06-05 DIAGNOSIS — B372 Candidiasis of skin and nail: Secondary | ICD-10-CM

## 2018-06-05 MED ORDER — HYDROCHLOROTHIAZIDE 25 MG PO TABS: 25.0000 mg | ORAL_TABLET | Freq: Every day | ORAL | 5 refills | Status: DC

## 2018-06-05 MED ORDER — LAMOTRIGINE 100 MG PO TABS: 100.0000 mg | ORAL_TABLET | Freq: Every day | ORAL | 5 refills | Status: DC

## 2018-06-05 MED ORDER — CLONAZEPAM 0.5 MG PO TABS: 0.5000 mg | ORAL_TABLET | Freq: Two times a day (BID) | ORAL | 2 refills | Status: DC

## 2018-06-05 MED ORDER — LEVOTHYROXINE SODIUM 88 MCG PO TABS
ORAL_TABLET | ORAL | 1 refills | Status: DC
Start: 2018-06-05 — End: 2019-01-22

## 2018-06-05 MED ORDER — RANITIDINE HCL 150 MG PO TABS: 150.0000 mg | ORAL_TABLET | Freq: Two times a day (BID) | ORAL | 3 refills | Status: DC

## 2018-06-05 MED ORDER — FLUCONAZOLE 150 MG PO TABS: ORAL_TABLET | ORAL | 0 refills | Status: DC

## 2018-06-05 MED ORDER — METOPROLOL SUCCINATE ER 50 MG PO TB24: 50.0000 mg | ORAL_TABLET | Freq: Every day | ORAL | 1 refills | Status: DC

## 2018-06-05 MED ORDER — TOLTERODINE TARTRATE ER 4 MG PO CP24: 4.0000 mg | ORAL_CAPSULE | Freq: Every day | ORAL | 3 refills | Status: DC

## 2018-06-05 MED ORDER — LOSARTAN POTASSIUM 100 MG PO TABS: 100.0000 mg | ORAL_TABLET | Freq: Every day | ORAL | 5 refills | Status: DC
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
## 2018-06-05 NOTE — Patient Instructions (Signed)
Urinary Incontinence Urinary incontinence is the involuntary loss of urine from your bladder. What are the causes? There are many causes of urinary incontinence. They include:  Medicines.  Infections.  Prostatic enlargement, leading to overflow of urine from your bladder.  Surgery.  Neurological diseases.  Emotional factors.  What are the signs or symptoms? Urinary Incontinence can be divided into four types: 1. Urge incontinence. Urge incontinence is the involuntary loss of urine before you have the opportunity to go to the bathroom. There is a sudden urge to void but not enough time to reach a bathroom. 2. Stress incontinence. Stress incontinence is the sudden loss of urine with any activity that forces urine to pass. It is commonly caused by anatomical changes to the pelvis and sphincter areas of your body. 3. Overflow incontinence. Overflow incontinence is the loss of urine from an obstructed opening to your bladder. This results in a backup of urine and a resultant buildup of pressure within the bladder. When the pressure within the bladder exceeds the closing pressure of the sphincter, the urine overflows, which causes incontinence, similar to water overflowing a dam. 4. Total incontinence. Total incontinence is the loss of urine as a result of the inability to store urine within your bladder.  How is this diagnosed? Evaluating the cause of incontinence may require:  A thorough and complete medical and obstetric history.  A complete physical exam.  Laboratory tests such as a urine culture and sensitivities.  When additional tests are indicated, they can include:  An ultrasound exam.  Kidney and bladder X-rays.  Cystoscopy. This is an exam of the bladder using a narrow scope.  Urodynamic testing to test the nerve function to the bladder and sphincter areas.  How is this treated? Treatment for urinary incontinence depends on the cause:  For urge incontinence caused  by a bacterial infection, antibiotics will be prescribed. If the urge incontinence is related to medicines you take, your health care provider may have you change the medicine.  For stress incontinence, surgery to re-establish anatomical support to the bladder or sphincter, or both, will often correct the condition.  For overflow incontinence caused by an enlarged prostate, an operation to open the channel through the enlarged prostate will allow the flow of urine out of the bladder. In women with fibroids, a hysterectomy may be recommended.  For total incontinence, surgery on your urinary sphincter may help. An artificial urinary sphincter (an inflatable cuff placed around the urethra) may be required. In women who have developed a hole-like passage between their bladder and vagina (vesicovaginal fistula), surgery to close the fistula often is required.  Follow these instructions at home:  Normal daily hygiene and the use of pads or adult diapers that are changed regularly will help prevent odors and skin damage.  Avoid caffeine. It can overstimulate your bladder.  Use the bathroom regularly. Try about every 2-3 hours to go to the bathroom, even if you do not feel the need to do so. Take time to empty your bladder completely. After urinating, wait a minute. Then try to urinate again.  For causes involving nerve dysfunction, keep a log of the medicines you take and a journal of the times you go to the bathroom. Contact a health care provider if:  You experience worsening of pain instead of improvement in pain after your procedure.  Your incontinence becomes worse instead of better. Get help right away if:  You experience fever or shaking chills.  You are unable to   pass your urine.  You have redness spreading into your groin or down into your thighs. This information is not intended to replace advice given to you by your health care provider. Make sure you discuss any questions you have  with your health care provider. Document Released: 12/23/2004 Document Revised: 06/25/2016 Document Reviewed: 04/24/2013 Elsevier Interactive Patient Education  2018 Elsevier Inc.  

## 2018-06-05 NOTE — Progress Notes (Signed)
Subjective:    Patient ID: Dawn Craig, female    DOB: 1971-04-08, 47 y.o.   MRN: [id number]   Chief Complaint: Medical Management of Chronic Issues   HPI:  1. Intractable chronic migraine without aura and with status migrainosus  patient says that she has not had a migraine in quite some time.  2. Essential hypertension, benign  No c/o chest pia, sob or headaches. Does not check blood pressure at home BP Readings from Last 3 Encounters:  06/05/18 121/84  05/16/18 112/68  03/02/18 138/89     3. Gastroesophageal reflux disease, esophagitis presence not specified  Takes zantac. Has occasional breakthrough symptoms but overall is doing well  4. 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 Urge incontinence of urine  She was on myrbetriq and it did not help. She has also tried vesicare which did not help either.  6. Severe obesity (BMI >= 40) (HCC)  No recent weight changes  7. Hyperlipidemia with target LDL less than 100  Does not watch diet and does no exercise.  8. Bipolar II disorder ([place])  She is currently is on latuda and zoloft. She says that latuda has made her feel no different. Psych started her on it and she has followup with them on Friday. Depression screen [facility] 2/9 06/05/2018 05/16/2018 03/02/2018  Decreased Interest _0 Down, Depressed, Hopeless _1 PHQ - 2 Score _2 Altered sleeping _3 Tired, decreased energy _4 Change in appetite _5 Feeling bad or failure about yourself  _6 Trouble concentrating _7 Moving slowly or fidgety/restless _8 Suicidal thoughts _9 PHQ-9 Score _10 Some recent data might be hidden     9. Agoraphobia with panic attacks  Is still seeing psych for counseling. Again she has followup on friday  10. HSV-1 (herpes simplex virus 1) infection  No recent flare up.    Outpatient Encounter Medications as of 06/05/2018  Medication Sig  . betamethasone  dipropionate (DIPROLENE) 0.05 % cream Apply topically 2 (two) times daily.  . cetirizine (ZYRTEC) 10 MG tablet TAKE 1 TABLET BY MOUTH EVERY DAY  . Cholecalciferol (VITAMIN D) 2000 UNITS CAPS Take by mouth.  . clonazePAM (KLONOPIN) 0.5 MG tablet Take 1 tablet (0.5 mg total) by mouth 2 (two) times daily.  . fluticasone (FLONASE) 50 MCG/ACT nasal spray INSTILL 1 SPRAY INTO EACH NOSTRIL 2 TIMES DAILY  . hydrochlorothiazide (HYDRODIURIL) 25 MG tablet Take 1 tablet (25 mg total) by mouth daily.  Marland Kitchen ketoconazole (NIZORAL) 2 % cream Apply 1 application topically daily. x2-3 weeks  . lamoTRIgine (LAMICTAL) 100 MG tablet Take 1 tablet (100 mg total) by mouth daily.  Marland Kitchen levothyroxine (SYNTHROID, LEVOTHROID) 88 MCG tablet TAKE 1 TABLET (88 MCG TOTAL) BY MOUTH DAILY.  Marland Kitchen losartan (COZAAR) 100 MG tablet Take 1 tablet (100 mg total) by mouth daily.  Marland Kitchen lurasidone (LATUDA) 40 MG TABS tablet Take 1 tablet (40 mg total) by mouth daily with breakfast.  . medroxyPROGESTERone Acetate 150 MG/ML SUSY INJECT 1 ML (150 MG TOTAL) INTO THE MUSCLE EVERY 3 (THREE) MONTHS.  Marland Kitchen metoprolol succinate (TOPROL-XL) 50 MG 24 hr tablet Take 1 tablet (50 mg total) by mouth daily. Take with or immediately following a meal.  . naproxen (NAPROSYN) 500 MG tablet TAKE 1 TABLET (500 MG TOTAL)  BY MOUTH 2 (TWO) TIMES DAILY WITH A MEAL.  . ranitidine (ZANTAC) 150 MG tablet Take 1 tablet (150 mg total) by mouth 2 (two) times daily.  . sertraline (ZOLOFT) 50 MG tablet Take 1 tablet (50 mg total) by mouth daily.  Marland Kitchen triamcinolone cream (KENALOG) 0.1 % Apply 1 application topically 2 (two) times daily.      New complaints: Patient saw Dr. Alinda Deem 2 weeks ago with yeast infection under hr breast. She was given nystatin cream which has helped in breast area but now has in abdominal fold.  Social history: Lives by herself.only family she has is her son and he lives several hours a way. She talks to him about every other day.   Review of Systems   Constitutional: Negative for activity change and appetite change.  HENT: Negative.   Eyes: Negative for pain.  Respiratory: Negative for shortness of breath.   Cardiovascular: Negative for chest pain, palpitations and leg swelling.  Gastrointestinal: Negative for abdominal pain.  Endocrine: Negative for polydipsia.  Genitourinary: Negative.   Musculoskeletal: Negative.   Skin: Positive for rash (abdominal fold).  Neurological: Negative for dizziness, weakness and headaches.  Hematological: Does not bruise/bleed easily.  Psychiatric/Behavioral: Negative.   All other systems reviewed and are negative.      Objective:   Physical Exam  Constitutional: She is oriented to person, place, and time. She appears well-developed and well-nourished. She appears distressed (moderate).  HENT:  Head: Normocephalic.  Nose: Nose normal.  Mouth/Throat: Oropharynx is clear and moist.  Eyes: Pupils are equal, round, and reactive to light. EOM are normal.  Neck: Normal range of motion. Neck supple. No JVD present. Carotid bruit is not present.  Cardiovascular: Normal rate, regular rhythm, normal heart sounds and intact distal pulses.  Pulmonary/Chest: Effort normal and breath sounds normal. No respiratory distress. She has no wheezes. She has no rales. She exhibits no tenderness.  Abdominal: Soft. Normal appearance, normal aorta and bowel sounds are normal. She exhibits no distension, no abdominal bruit, no pulsatile midline mass and no mass. There is no splenomegaly or hepatomegaly. There is no tenderness.  Musculoskeletal: Normal range of motion. She exhibits no edema.  Lymphadenopathy:    She has no cervical adenopathy.  Neurological: She is alert and oriented to person, place, and time. She has normal reflexes.  Skin: Skin is warm and dry.  Erythematous moist area abdominal fold  Psychiatric: Her speech is normal and behavior is normal. Judgment and thought content normal. Cognition and memory are  normal. She exhibits a depressed mood.   BP 121/84   Pulse 95   Temp 98 F (36.7 C) (Oral)   Ht _0  (1.549 m)   Wt 278 lb (126.1 kg)   BMI 52.53 kg/m       Assessment & Plan:  Dawn Craig comes in today with chief complaint of Medical Management of Chronic Issues   Diagnosis and orders addressed:  1. Intractable chronic migraine without aura and with status migrainosus Keep diary of events  2. Essential hypertension, benign Low sodium diet - hydrochlorothiazide (HYDRODIURIL) 25 MG tablet; Take 1 tablet (25 mg total) by mouth daily.  Dispense: 30 tablet; Refill: 5 - losartan (COZAAR) 100 MG tablet; Take 1 tablet (100 mg total) by mouth daily.  Dispense: 30 tablet; Refill: 5 - CMP14+EGFR  3. Gastroesophageal reflux disease, esophagitis presence not specified Avoid spicy foods Do not eat 2 hours prior to bedtime - ranitidine (ZANTAC) 150 MG tablet; Take 1 tablet (150  mg total) by mouth 2 (two) times daily.  Dispense: 60 tablet; Refill: 3  4. 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  Dispense: 90 tablet; Refill: 1 - Thyroid Panel With TSH  5. Urge incontinence of urine Will try detrol la  6. Severe obesity (BMI >= 40) (HCC) Discussed diet and exercise for person with BMI >25 Will recheck weight in 3-6 months   7. Hyperlipidemia with target LDL less than 100 Low fat diet - Lipid panel  8. Bipolar II disorder ([place]) Keep follow up with psych  9. Agoraphobia with panic attacks Keep follow up with psych - clonazePAM (KLONOPIN) 0.5 MG tablet; Take 1 tablet (0.5 mg total) by mouth 2 (two) times daily.  Dispense: 60 tablet; Refill: 2  10. HSV-1 (herpes simplex virus 1) infection  11. Bipolar 1 disorder, mixed, moderate (HCC) - lamoTRIgine (LAMICTAL) 100 MG tablet; Take 1 tablet (100 mg total) by mouth daily.  Dispense: 60 tablet; Refill: 5  12. Sinus  tachycardia Avoid caffeine - metoprolol succinate (TOPROL-XL) 50 MG 24 hr tablet; Take 1 tablet (50 mg total) by mouth daily. Take with or immediately following a meal.  Dispense: 90 tablet; Refill: 1  13. Cutaneous candidiasis Keep area as dry as possible 'continue nystatin cream as rx - fluconazole (DIFLUCAN) 150 MG tablet; 1 po q week x 4 weeks  Dispense: 4 tablet; Refill: 0   Labs pending Health Maintenance reviewed Diet and exercise encouraged  Follow up plan: 3 months   Mary-Margaret Hassell Done, FNP

## 2018-06-06 LAB — CMP14+EGFR
A/G RATIO: 1.7 (ref 1.2–2.2)
ALBUMIN: 4.3 g/dL (ref 3.5–5.5)
ALT: 12 IU/L (ref 0–32)
AST: 16 IU/L (ref 0–40)
Alkaline Phosphatase: 76 IU/L (ref 39–117)
BUN/Creatinine Ratio: 6 — ABNORMAL LOW (ref 9–23)
BUN: 5 mg/dL — ABNORMAL LOW (ref 6–24)
Bilirubin Total: 0.5 mg/dL (ref 0.0–1.2)
CALCIUM: 9.2 mg/dL (ref 8.7–10.2)
CO2: 24 mmol/L (ref 20–29)
Chloride: 103 mmol/L (ref 96–106)
Creatinine, Ser: 0.82 mg/dL (ref 0.57–1.00)
GFR, EST AFRICAN AMERICAN: 99 mL/min/{1.73_m2} (ref >59)
GFR, EST NON AFRICAN AMERICAN: 86 mL/min/{1.73_m2} (ref >59)
Globulin, Total: 2.5 g/dL (ref 1.5–4.5)
Glucose: 119 mg/dL — ABNORMAL HIGH (ref 65–99)
POTASSIUM: 3.9 mmol/L (ref 3.5–5.2)
Sodium: 142 mmol/L (ref 134–144)
Total Protein: 6.8 g/dL (ref 6.0–8.5)

## 2018-06-06 LAB — LIPID PANEL
Chol/HDL Ratio: 6.2 ratio — ABNORMAL HIGH (ref 0.0–4.4)
Cholesterol, Total: 154 mg/dL (ref 100–199)
HDL: 25 mg/dL — AB (ref >39)
LDL Calculated: 84 mg/dL (ref 0–99)
Triglycerides: 223 mg/dL — ABNORMAL HIGH (ref 0–149)
VLDL CHOLESTEROL CAL: 45 mg/dL — AB (ref 5–40)

## 2018-06-06 LAB — THYROID PANEL WITH TSH
FREE THYROXINE INDEX: 2.3 (ref 1.2–4.9)
T3 Uptake Ratio: 26 % (ref 24–39)
T4, Total: 8.7 ug/dL (ref 4.5–12.0)
TSH: 2.66 u[IU]/mL (ref 0.450–4.500)

## 2018-06-09 ENCOUNTER — Ambulatory Visit (HOSPITAL_COMMUNITY): Payer: Medicaid Other | Admitting: Psychiatry

## 2018-06-09 VITALS — BP 116/67 | HR 90 | Ht 61.0 in | Wt 275.0 lb

## 2018-06-09 DIAGNOSIS — I10 Essential (primary) hypertension: Secondary | ICD-10-CM

## 2018-06-09 DIAGNOSIS — F3181 Bipolar II disorder: Secondary | ICD-10-CM | POA: Diagnosis not present

## 2018-06-09 DIAGNOSIS — G4700 Insomnia, unspecified: Secondary | ICD-10-CM

## 2018-06-09 DIAGNOSIS — F410 Panic disorder [episodic paroxysmal anxiety] without agoraphobia: Secondary | ICD-10-CM | POA: Diagnosis not present

## 2018-06-09 DIAGNOSIS — F419 Anxiety disorder, unspecified: Secondary | ICD-10-CM

## 2018-06-09 DIAGNOSIS — R454 Irritability and anger: Secondary | ICD-10-CM | POA: Diagnosis not present

## 2018-06-09 DIAGNOSIS — F5081 Binge eating disorder: Secondary | ICD-10-CM | POA: Diagnosis not present

## 2018-06-09 DIAGNOSIS — R45851 Suicidal ideations: Secondary | ICD-10-CM | POA: Diagnosis not present

## 2018-06-09 DIAGNOSIS — R450 Nervousness: Secondary | ICD-10-CM | POA: Diagnosis not present

## 2018-06-09 MED ORDER — LURASIDONE HCL 20 MG PO TABS: 20.0000 mg | ORAL_TABLET | Freq: Every day | ORAL | 0 refills | Status: DC

## 2018-06-09 MED ORDER — TRAZODONE HCL 50 MG PO TABS: ORAL_TABLET | ORAL | 0 refills | Status: DC

## 2018-06-09 MED ORDER — SERTRALINE HCL 50 MG PO TABS: 50.0000 mg | ORAL_TABLET | Freq: Every day | ORAL | 0 refills | Status: DC

## 2018-06-09 NOTE — Patient Instructions (Addendum)
1.Decrease Latuda 20 mgat night 2. Continue sertraline 50 mg at night 3. Increase lamotrigine 150 mg daily  4. Continue clonazepam 0.5 mg daily as needed for anxiety 5. Start Trazodone 25-50 mg at night as needed for sleep 5.Return to clinic in one month for 30 mins

## 2018-07-03 ENCOUNTER — Other Ambulatory Visit (HOSPITAL_COMMUNITY): Payer: Self-pay | Admitting: Psychiatry

## 2018-07-03 ENCOUNTER — Other Ambulatory Visit: Payer: Self-pay | Admitting: Nurse Practitioner

## 2018-07-03 ENCOUNTER — Telehealth (HOSPITAL_COMMUNITY): Payer: Self-pay | Admitting: *Deleted

## 2018-07-03 MED ORDER — LAMOTRIGINE 150 MG PO TABS: 150.0000 mg | ORAL_TABLET | Freq: Every day | ORAL | 0 refills | Status: DC

## 2018-07-03 NOTE — Telephone Encounter (Signed)
Ordered lamotrigine 150 mg daily. Please contact the pharmacy and discontinue order from Bennie PieriniMartin, Mary-Margaret, FNP.

## 2018-07-03 NOTE — Telephone Encounter (Signed)
DONE

## 2018-07-03 NOTE — Telephone Encounter (Signed)
Dr Vanetta ShawlHisada Patient called stating that she saw you on the 7/12 & was instructed to start taking 1 1/2  Lamitcal tablet daily. But she saw the FNP- @ [facility] on 7/8 & she filled all her med's then. SInce her visit with you on 7/12 she has been Taking the 1 1/2 tablet & now she need new script for a refill

## 2018-07-04 ENCOUNTER — Ambulatory Visit: Payer: Medicaid Other | Admitting: Family

## 2018-07-04 ENCOUNTER — Encounter: Payer: Self-pay | Admitting: Family

## 2018-07-04 VITALS — BP 123/79 | HR 103 | Temp 98.1°F | Ht 61.0 in | Wt 281.0 lb

## 2018-07-04 DIAGNOSIS — J0100 Acute maxillary sinusitis, unspecified: Secondary | ICD-10-CM

## 2018-07-04 DIAGNOSIS — M25552 Pain in left hip: Secondary | ICD-10-CM

## 2018-07-04 MED ORDER — AMOXICILLIN-POT CLAVULANATE 875-125 MG PO TABS: 1.0000 | ORAL_TABLET | Freq: Two times a day (BID) | ORAL | 0 refills | Status: DC

## 2018-07-04 NOTE — Progress Notes (Signed)
Subjective:    Patient ID: Dawn Craig, female    DOB: [birth year], 47 y.o.   MRN: [id number]  Chief Complaint  Patient presents with  . Sinus Problem  . Dizziness  . numb left hip    Sinusitis  This is a recurrent problem. The current episode started 1 to 4 weeks ago. The problem is unchanged. There has been no fever. Her pain is at a severity of 10/10. The pain is moderate. Associated symptoms include congestion, coughing, ear pain, headaches, a hoarse voice, sinus pressure and a sore throat. Pertinent negatives include no sneezing or swollen glands. Past treatments include oral decongestants. The treatment provided mild relief.  Hip Pain   The incident occurred more than 1 week ago. There was no injury mechanism. The pain is present in the left hip. The quality of the pain is described as burning. The pain is at a severity of 2/10. The pain is mild. The pain has been intermittent since onset. Associated symptoms include numbness and tingling. She reports no foreign bodies present. Nothing aggravates the symptoms. She has tried rest for the symptoms. The treatment provided no relief.      Review of Systems  HENT: Positive for congestion, ear pain, hoarse voice, sinus pressure and sore throat. Negative for sneezing.   Respiratory: Positive for cough.   Neurological: Positive for tingling, numbness and headaches.  All other systems reviewed and are negative.      Objective:   Physical Exam  Constitutional: She is oriented to person, place, and time. She appears well-developed and well-nourished. No distress.  HENT:  Head: Normocephalic and atraumatic.  Right Ear: External ear normal.  Left Ear: External ear normal.  Nose: Mucosal edema and rhinorrhea present. Right sinus exhibits maxillary sinus tenderness. Left sinus exhibits maxillary sinus tenderness and frontal sinus tenderness.  Mouth/Throat: Posterior oropharyngeal erythema present.  Eyes: Pupils are equal, round, and  reactive to light.  Neck: Normal range of motion. Neck supple. No thyromegaly present.  Cardiovascular: Normal rate, regular rhythm, normal heart sounds and intact distal pulses.  No murmur heard. Pulmonary/Chest: Effort normal and breath sounds normal. No respiratory distress. She has no wheezes.  Abdominal: Soft. Bowel sounds are normal. She exhibits no distension. There is no tenderness.  Musculoskeletal: Normal range of motion. She exhibits no edema or tenderness.  Neurological: She is alert and oriented to person, place, and time. She has normal reflexes. No cranial nerve deficit.  Skin: Skin is warm and dry.  Psychiatric: She has a normal mood and affect. Her behavior is normal. Judgment and thought content normal.  Vitals reviewed.    BP 123/79   Pulse (!) 103   Temp 98.1 F (36.7 C) (Oral)   Ht 5\' 1"  (1.549 m)   Wt 281 lb (127.5 kg)   BMI 53.09 kg/m      Assessment & Plan:  Mashal Slavick comes in today with chief complaint of Sinus Problem; Dizziness; and numb left hip   Diagnosis and orders addressed:  1. Acute non-recurrent maxillary sinusitis - Take meds as prescribed - Use a cool mist humidifier  -Use saline nose sprays frequently -Force fluids -For any cough or congestion  Use plain Mucinex- regular strength or max strength is fine -For fever or aces or pains- take tylenol or ibuprofen. -Throat lozenges if help - amoxicillin-clavulanate (AUGMENTIN) 875-125 MG tablet; Take 1 tablet by mouth 2 (two) times daily.  Dispense: 14 tablet; Refill: 0  2. Left hip pain Rest Ice Naprosyn  BID with food Keep follow up with Chiropractor    Jannifer Rodneyhristy Hawks, FNP

## 2018-07-04 NOTE — Patient Instructions (Signed)

## 2018-07-10 ENCOUNTER — Other Ambulatory Visit (HOSPITAL_COMMUNITY): Payer: Self-pay | Admitting: Psychiatry

## 2018-07-10 DIAGNOSIS — J0100 Acute maxillary sinusitis, unspecified: Secondary | ICD-10-CM

## 2018-07-10 MED ORDER — SERTRALINE HCL 50 MG PO TABS: 50.0000 mg | ORAL_TABLET | Freq: Every day | ORAL | 0 refills | Status: DC

## 2018-07-12 ENCOUNTER — Ambulatory Visit (INDEPENDENT_AMBULATORY_CARE_PROVIDER_SITE_OTHER): Payer: Medicaid Other | Admitting: *Deleted

## 2018-07-12 DIAGNOSIS — Z3042 Encounter for surveillance of injectable contraceptive: Secondary | ICD-10-CM

## 2018-07-12 DIAGNOSIS — Z309 Encounter for contraceptive management, unspecified: Secondary | ICD-10-CM

## 2018-07-12 NOTE — Progress Notes (Signed)
Pt given Medroxyprogesterone inj Tolerated well 
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
## 2018-07-21 ENCOUNTER — Ambulatory Visit (HOSPITAL_COMMUNITY): Payer: Self-pay | Admitting: Psychiatry

## 2018-08-01 ENCOUNTER — Other Ambulatory Visit (HOSPITAL_COMMUNITY): Payer: Self-pay | Admitting: Psychiatry

## 2018-08-01 MED ORDER — LAMOTRIGINE 150 MG PO TABS: 150.0000 mg | ORAL_TABLET | Freq: Every day | ORAL | 0 refills | Status: DC
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
## 2018-08-10 ENCOUNTER — Encounter (HOSPITAL_COMMUNITY): Payer: Self-pay | Admitting: Psychiatry

## 2018-08-10 ENCOUNTER — Ambulatory Visit (HOSPITAL_COMMUNITY): Payer: Medicaid Other | Admitting: Psychiatry

## 2018-08-10 VITALS — BP 116/76 | HR 110 | Ht 61.0 in | Wt 276.0 lb

## 2018-08-10 DIAGNOSIS — G4710 Hypersomnia, unspecified: Secondary | ICD-10-CM

## 2018-08-10 DIAGNOSIS — F1099 Alcohol use, unspecified with unspecified alcohol-induced disorder: Secondary | ICD-10-CM

## 2018-08-10 DIAGNOSIS — Z634 Disappearance and death of family member: Secondary | ICD-10-CM | POA: Diagnosis not present

## 2018-08-10 DIAGNOSIS — F3181 Bipolar II disorder: Secondary | ICD-10-CM | POA: Diagnosis not present

## 2018-08-10 DIAGNOSIS — G4700 Insomnia, unspecified: Secondary | ICD-10-CM

## 2018-08-10 DIAGNOSIS — Z818 Family history of other mental and behavioral disorders: Secondary | ICD-10-CM

## 2018-08-10 DIAGNOSIS — F419 Anxiety disorder, unspecified: Secondary | ICD-10-CM

## 2018-08-10 MED ORDER — LURASIDONE HCL 20 MG PO TABS: 20.0000 mg | ORAL_TABLET | Freq: Every day | ORAL | 2 refills | Status: DC

## 2018-08-10 MED ORDER — LAMOTRIGINE 150 MG PO TABS: 150.0000 mg | ORAL_TABLET | Freq: Every day | ORAL | 2 refills | Status: DC

## 2018-08-10 MED ORDER — SERTRALINE HCL 50 MG PO TABS: 50.0000 mg | ORAL_TABLET | Freq: Every day | ORAL | 2 refills | Status: DC

## 2018-08-10 NOTE — Patient Instructions (Signed)
1.Cotinue Latuda 20 mgat night 2.Continue sertraline50 mg at night 3. Continue lamotrigine 150 mg daily  4. Continue clonazepam 0.5 mg daily as needed for anxiety 5.Return to clinic in three months for 15 mins

## 2018-08-21 ENCOUNTER — Other Ambulatory Visit: Payer: Self-pay | Admitting: *Deleted

## 2018-08-21 MED ORDER — CETIRIZINE HCL 10 MG PO TABS: 10.0000 mg | ORAL_TABLET | Freq: Every day | ORAL | 1 refills | Status: DC

## 2018-09-06 ENCOUNTER — Ambulatory Visit (INDEPENDENT_AMBULATORY_CARE_PROVIDER_SITE_OTHER): Payer: Medicaid Other | Admitting: Nurse Practitioner

## 2018-09-06 ENCOUNTER — Encounter: Payer: Self-pay | Admitting: Nurse Practitioner

## 2018-09-06 VITALS — BP 153/90 | HR 124 | Temp 99.8°F | Resp 20 | Ht 61.0 in | Wt 275.0 lb

## 2018-09-06 DIAGNOSIS — R079 Chest pain, unspecified: Secondary | ICD-10-CM

## 2018-09-06 DIAGNOSIS — R112 Nausea with vomiting, unspecified: Secondary | ICD-10-CM

## 2018-09-06 MED ORDER — ONDANSETRON HCL 4 MG PO TABS: 4.0000 mg | ORAL_TABLET | Freq: Three times a day (TID) | ORAL | 0 refills | Status: DC | PRN

## 2018-09-06 NOTE — Progress Notes (Signed)
   Subjective:    Patient ID: Mckensi Redinger, female    DOB: 12-06-1970, 47 y.o.   MRN: [id number]   Chief Complaint: Chest Pain   HPI Patient was actually scheduled for follow up of chronic issues today but she came in early c/o chest pain. trhis morning she got up feeling nauseaous and dry heaves. When she got here she started sweating with chest then she developed a heavy feeling in her chest with sob.. Rates chest pain 5/10 .She has history of panic attacks and will get chest pain with them occasionally.  Review of Systems  Constitutional: Positive for chills, diaphoresis and fatigue. Negative for fever.  Respiratory: Positive for cough and shortness of breath.   Cardiovascular: Positive for chest pain.  Gastrointestinal: Positive for nausea and vomiting.  Neurological: Positive for dizziness and headaches.  All other systems reviewed and are negative.      Objective:   Physical Exam  Constitutional: She is oriented to person, place, and time. She appears well-developed and well-nourished.  Eyes: Pupils are equal, round, and reactive to light.  Neck: Normal range of motion. Neck supple.  Cardiovascular: Normal pulses. Tachycardia present.  Pulmonary/Chest: Effort normal and breath sounds normal.  Musculoskeletal: Normal range of motion.  Neurological: She is alert and oriented to person, place, and time.  Skin: Skin is warm and dry.  Psychiatric: She has a normal mood and affect. Her behavior is normal.   BP (!) 153/90 (BP Location: Right Wrist, Patient Position: Sitting, Cuff Size: Small)   Pulse (!) 124   Temp 99.8 F (37.7 C) (Oral)   Resp 20   Ht 5\' 1"  (1.549 m)   Wt 275 lb (124.7 kg)   SpO2 98%   BMI 51.96 kg/m   EKG- sinus tachycardia      Assessment & Plan:  Laporsha Grealish in today with chief complaint of Chest Pain (shortness of breath started about an hour ago and chest pain began once she was in the office); Excessive Sweating; and Nausea (nausea since this  morning)   1. Chest pain, unspecified type Rest If chest pain contiues need to go to er - EKG 12-Lead  2. Non-intractable vomiting with nausea, unspecified vomiting type Bland diet Rest Force fluids - ondansetron (ZOFRAN) 4 MG tablet; Take 1 tablet (4 mg total) by mouth every 8 (eight) hours as needed for nausea or vomiting.  Dispense: 20 tablet; Refill: 0  Mary-Margaret Daphine Deutscher, FNP

## 2018-09-06 NOTE — Patient Instructions (Signed)
Nausea and Vomiting, Adult Feeling sick to your stomach (nausea) means that your stomach is upset or you feel like you have to throw up (vomit). Feeling more and more sick to your stomach can lead to throwing up. Throwing up happens when food and liquid from your stomach are thrown up and out the mouth. Throwing up can make you feel weak and cause you to get dehydrated. Dehydration can make you tired and thirsty, make you have a dry mouth, and make it so you pee (urinate) less often. Older adults and people with other diseases or a weak defense system (immune system) are at higher risk for dehydration. If you feel sick to your stomach or if you throw up, it is important to follow instructions from your doctor about how to take care of yourself. Follow these instructions at home: Eating and drinking Follow these instructions as told by your doctor:  Take an oral rehydration solution (ORS). This is a drink that is sold at pharmacies and stores.  Drink clear fluids in small amounts as you are able, such as: ? Water. ? Ice chips. ? Diluted fruit juice. ? Low-calorie sports drinks.  Eat bland, easy-to-digest foods in small amounts as you are able, such as: ? Bananas. ? Applesauce. ? Rice. ? Low-fat (lean) meats. ? Toast. ? Crackers.  Avoid fluids that have a lot of sugar or caffeine in them.  Avoid alcohol.  Avoid spicy or fatty foods.  General instructions  Drink enough fluid to keep your pee (urine) clear or pale yellow.  Wash your hands often. If you cannot use soap and water, use hand sanitizer.  Make sure that all people in your home wash their hands well and often.  Take over-the-counter and prescription medicines only as told by your doctor.  Rest at home while you get better.  Watch your condition for any changes.  Breathe slowly and deeply when you feel sick to your stomach.  Keep all follow-up visits as told by your doctor. This is important. Contact a doctor  if:  You have a fever.  You cannot keep fluids down.  Your symptoms get worse.  You have new symptoms.  You feel sick to your stomach for more than two days.  You feel light-headed or dizzy.  You have a headache.  You have muscle cramps. Get help right away if:  You have pain in your chest, neck, arm, or jaw.  You feel very weak or you pass out (faint).  You throw up again and again.  You see blood in your throw-up.  Your throw-up looks like black coffee grounds.  You have bloody or black poop (stools) or poop that look like tar.  You have a very bad headache, a stiff neck, or both.  You have a rash.  You have very bad pain, cramping, or bloating in your belly (abdomen).  You have trouble breathing.  You are breathing very quickly.  Your heart is beating very quickly.  Your skin feels cold and clammy.  You feel confused.  You have pain when you pee.  You have signs of dehydration, such as: ? Dark pee, hardly any pee, or no pee. ? Cracked lips. ? Dry mouth. ? Sunken eyes. ? Sleepiness. ? Weakness. These symptoms may be an emergency. Do not wait to see if the symptoms will go away. Get medical help right away. Call your local emergency services (911 in the [place] Do not drive yourself to the hospital. This information is   not intended to replace advice given to you by your health care provider. Make sure you discuss any questions you have with your health care provider. Document Released: 05/03/2008 Document Revised: 06/04/2016 Document Reviewed: 07/22/2015 Elsevier Interactive Patient Education  2018 [employer] Inc[employer]  

## 2018-09-07 ENCOUNTER — Other Ambulatory Visit: Payer: Self-pay | Admitting: Nurse Practitioner

## 2018-09-07 ENCOUNTER — Telehealth: Payer: Self-pay | Admitting: Nurse Practitioner

## 2018-09-07 MED ORDER — PANTOPRAZOLE SODIUM 40 MG PO TBEC: 40.0000 mg | DELAYED_RELEASE_TABLET | Freq: Every day | ORAL | 1 refills | Status: DC

## 2018-09-07 NOTE — Telephone Encounter (Signed)
Patient spoke with MMM

## 2018-10-03 ENCOUNTER — Other Ambulatory Visit: Payer: Self-pay | Admitting: Nurse Practitioner

## 2018-10-05 ENCOUNTER — Ambulatory Visit: Payer: Medicaid Other

## 2018-10-06 ENCOUNTER — Ambulatory Visit (INDEPENDENT_AMBULATORY_CARE_PROVIDER_SITE_OTHER): Payer: Medicaid Other | Admitting: *Deleted

## 2018-10-06 DIAGNOSIS — Z3042 Encounter for surveillance of injectable contraceptive: Secondary | ICD-10-CM

## 2018-10-06 MED ORDER — MEDROXYPROGESTERONE ACETATE 150 MG/ML IM SUSP
150.0000 mg | Freq: Once | INTRAMUSCULAR | Status: AC
  Administered 2018-10-06: 150 mg via INTRAMUSCULAR

## 2018-10-06 NOTE — Progress Notes (Signed)
Pt given Medroxyprogesterone inj Tolerated well 

## 2018-10-08 ENCOUNTER — Other Ambulatory Visit: Payer: Self-pay | Admitting: Nurse Practitioner

## 2018-10-08 DIAGNOSIS — N3941 Urge incontinence: Secondary | ICD-10-CM

## 2018-10-18 ENCOUNTER — Encounter: Payer: Self-pay | Admitting: Family Medicine

## 2018-10-18 ENCOUNTER — Ambulatory Visit: Payer: Medicaid Other | Admitting: Family Medicine

## 2018-10-18 VITALS — BP 131/85 | HR 106 | Temp 97.0°F | Ht 61.0 in | Wt 265.8 lb

## 2018-10-18 DIAGNOSIS — K529 Noninfective gastroenteritis and colitis, unspecified: Secondary | ICD-10-CM

## 2018-10-18 DIAGNOSIS — K219 Gastro-esophageal reflux disease without esophagitis: Secondary | ICD-10-CM | POA: Diagnosis not present

## 2018-10-18 NOTE — Progress Notes (Signed)
BP 131/85   Pulse (!) 106   Temp (!) 97 F (36.1 C) (Oral)   Ht 5\' 1"  (1.549 m)   Wt 265 lb 12.8 oz (120.6 kg)   SpO2 96%   BMI 50.22 kg/m    Subjective:    Patient ID: Dawn Craig, female    DOB: August 13, 1971, 47 y.o.   MRN: [id number]  HPI: Dawn Craig is a 47 y.o. female presenting on 10/18/2018 for Emesis (x 2 weeks); Nausea; Diarrhea; Cough (x 2 days); Nasal Congestion; and Headache   HPI Nausea and diarrhea and cough and nasal congestion and vomiting Patient comes in today complaining of 2 weeks of diarrhea and abdominal pain and indigestion and vomiting.  Patient has developed cough and nasal congestion over the past couple days.  She says the vomiting is more of a recent thing over the past couple weeks but she does say that she was having intermittent loose stools before for up to the past 4 months but it was only once or twice a day and now it is 5 or 6 times a day over the past couple weeks.  She has been trying to stay hydrated but says she is vomiting and having diarrhea with almost everything she is taking in.  She says she is still making urine but she is incontinent so it is hard to determine how much urine she is making.  She denies any abdominal pain or flank pain or burning with urination.  Relevant past medical, surgical, family and social history reviewed and updated as indicated. Interim medical history since our last visit reviewed. Allergies and medications reviewed and updated.  Review of Systems  Constitutional: Negative for chills and fever.  HENT: Positive for congestion. Negative for ear discharge and ear pain.   Eyes: Negative for redness and visual disturbance.  Respiratory: Positive for cough. Negative for chest tightness and shortness of breath.   Cardiovascular: Negative for chest pain and leg swelling.  Gastrointestinal: Positive for abdominal distention, diarrhea, nausea and vomiting. Negative for abdominal pain.  Genitourinary: Negative for  difficulty urinating, dysuria and flank pain.  Musculoskeletal: Negative for back pain and gait problem.  Skin: Negative for rash.  Neurological: Negative for light-headedness and headaches.  Psychiatric/Behavioral: Negative for agitation and behavioral problems.  All other systems reviewed and are negative.   Per HPI unless specifically indicated above   Allergies as of 10/18/2018      Reactions   Ace Inhibitors Cough   Famvir [famciclovir] Hives, Swelling   Throat closes    Tetanus Toxoids    Swelling at sight      Medication List        Accurate as of 10/18/18  3:47 PM. Always use your most recent med list.          betamethasone dipropionate 0.05 % cream Commonly known as:  DIPROLENE Apply topically 2 (two) times daily.   cetirizine 10 MG tablet Commonly known as:  ZYRTEC Take 1 tablet (10 mg total) by mouth daily.   clonazePAM 0.5 MG tablet Commonly known as:  KLONOPIN Take 1 tablet (0.5 mg total) by mouth 2 (two) times daily.   fluticasone 50 MCG/ACT nasal spray Commonly known as:  FLONASE INSTILL 1 SPRAY INTO EACH NOSTRIL 2 TIMES DAILY   hydrochlorothiazide 25 MG tablet Commonly known as:  HYDRODIURIL Take 1 tablet (25 mg total) by mouth daily.   ketoconazole 2 % cream Commonly known as:  NIZORAL Apply 1 application topically daily. x2-3 weeks  lamoTRIgine 150 MG tablet Commonly known as:  LAMICTAL Take 1 tablet (150 mg total) by mouth daily.   levothyroxine 88 MCG tablet Commonly known as:  SYNTHROID, LEVOTHROID TAKE 1 TABLET (88 MCG TOTAL) BY MOUTH DAILY.   losartan 100 MG tablet Commonly known as:  COZAAR Take 1 tablet (100 mg total) by mouth daily.   lurasidone 20 MG Tabs tablet Commonly known as:  LATUDA Take 1 tablet (20 mg total) by mouth daily with breakfast.   medroxyPROGESTERone Acetate 150 MG/ML Susy INJECT 1 ML (150 MG TOTAL) INTO THE MUSCLE EVERY 3 (THREE) MONTHS.   metoprolol succinate 50 MG 24 hr tablet Commonly known as:   TOPROL-XL Take 1 tablet (50 mg total) by mouth daily. Take with or immediately following a meal.   naproxen 500 MG tablet Commonly known as:  NAPROSYN TAKE 1 TABLET (500 MG TOTAL) BY MOUTH 2 (TWO) TIMES DAILY WITH A MEAL.   ondansetron 4 MG tablet Commonly known as:  ZOFRAN Take 1 tablet (4 mg total) by mouth every 8 (eight) hours as needed for nausea or vomiting.   pantoprazole 40 MG tablet Commonly known as:  PROTONIX Take 1 tablet (40 mg total) by mouth daily.   sertraline 50 MG tablet Commonly known as:  ZOLOFT Take 1 tablet (50 mg total) by mouth daily.   tolterodine 4 MG 24 hr capsule Commonly known as:  DETROL LA TAKE 1 CAPSULE BY MOUTH EVERY DAY   triamcinolone cream 0.1 % Commonly known as:  KENALOG Apply 1 application topically 2 (two) times daily.   Vitamin D 50 MCG (2000 UT) Caps Take by mouth.          Objective:    BP 131/85   Pulse (!) 106   Temp (!) 97 F (36.1 C) (Oral)   Ht 5\' 1"  (1.549 m)   Wt 265 lb 12.8 oz (120.6 kg)   SpO2 96%   BMI 50.22 kg/m   Wt Readings from Last 3 Encounters:  10/18/18 265 lb 12.8 oz (120.6 kg)  09/06/18 275 lb (124.7 kg)  07/04/18 281 lb (127.5 kg)    Physical Exam  Constitutional: She is oriented to person, place, and time. She appears well-developed and well-nourished. No distress.  Eyes: Conjunctivae are normal.  Cardiovascular: Normal rate, regular rhythm, normal heart sounds and intact distal pulses.  No murmur heard. Pulmonary/Chest: Effort normal and breath sounds normal. No respiratory distress. She has no wheezes.  Abdominal: Soft. Bowel sounds are normal. She exhibits no distension and no mass. There is no tenderness. There is no rebound and no guarding.  Musculoskeletal: Normal range of motion. She exhibits no edema.  Neurological: She is alert and oriented to person, place, and time. Coordination normal.  Skin: Skin is warm and dry. No rash noted. She is not diaphoretic.  Psychiatric: She has a normal  mood and affect. Her behavior is normal.  Nursing note and vitals reviewed.       Assessment & Plan:   Problem List Items Addressed This Visit      Digestive   GERD (gastroesophageal reflux disease) - Primary   Relevant Orders   Cdiff NAA+O+P+Stool Culture   Ambulatory referral to Gastroenterology    Other Visit Diagnoses    Gastroenteritis       Been going on for 2 weeks, has had some previous irritable bowel type picture over the past 4 months, will send to gastroenterology and do stool studies   Relevant Orders   Cdiff NAA+O+P+Stool  Culture   Ambulatory referral to Gastroenterology      Sounds like patient has had uncontrolled GERD and possibly colitis versus IBS, will send to gastroenterology for further evaluation and do a GI stool panel.  He is Artie taking Zofran and does not appear dehydrated on exam, continue with the Zofran and she can possibly would double it and continue with omeprazole. Follow up plan: Return in about 4 weeks (around 11/15/2018), or if symptoms worsen or fail to improve, for Follow-up with PCP in 4 weeks.  Counseling provided for all of the vaccine components Orders Placed This Encounter  Procedures  . Cdiff NAA+O+P+Stool Culture  . Ambulatory referral to Gastroenterology    Arville CareJoshua Jamel Holzmann, MD [facility] 10/18/2018, 3:47 PM

## 2018-10-23 ENCOUNTER — Encounter: Payer: Self-pay | Admitting: Gastroenterology

## 2018-10-29 IMAGING — DX DG CHEST 2V
2 series · 2 of 2 positions shown · non-contrast
Comparison: 11/16/2016 .

CLINICAL DATA: Cough and congestion.

EXAM:
CHEST  2 VIEW

[chest pa]
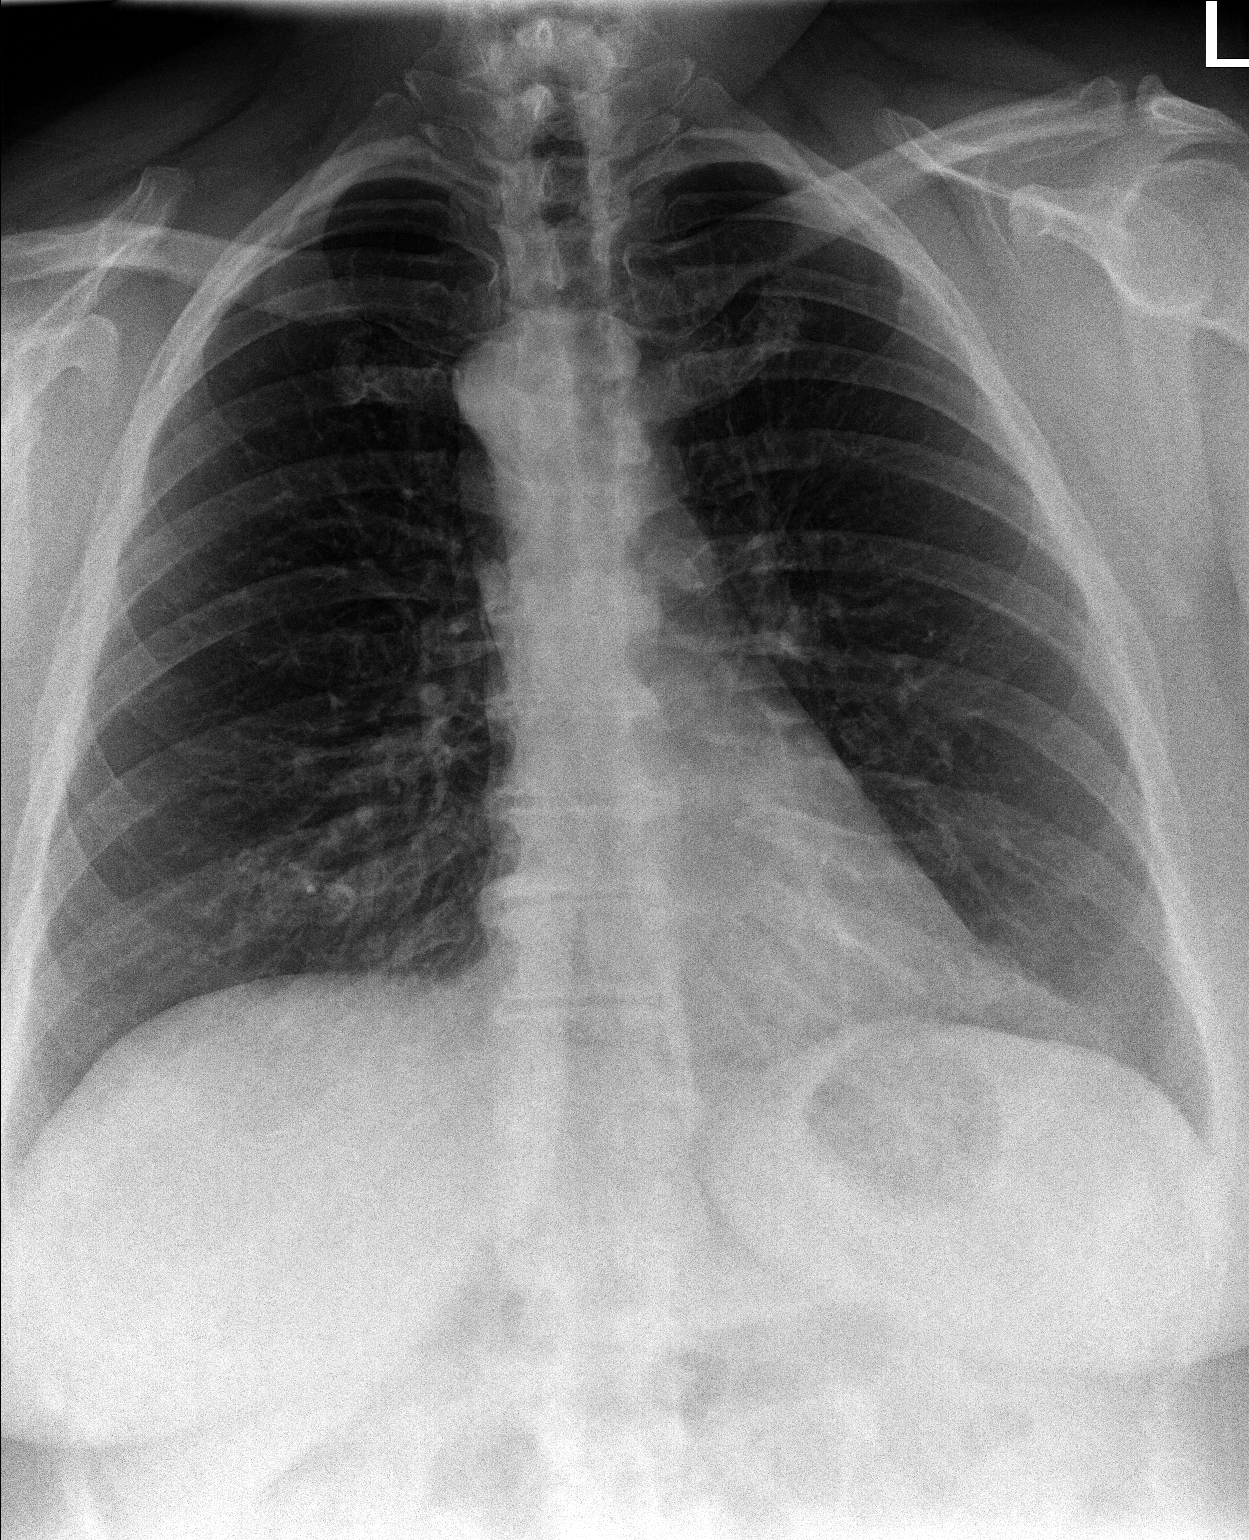

[chest lat]
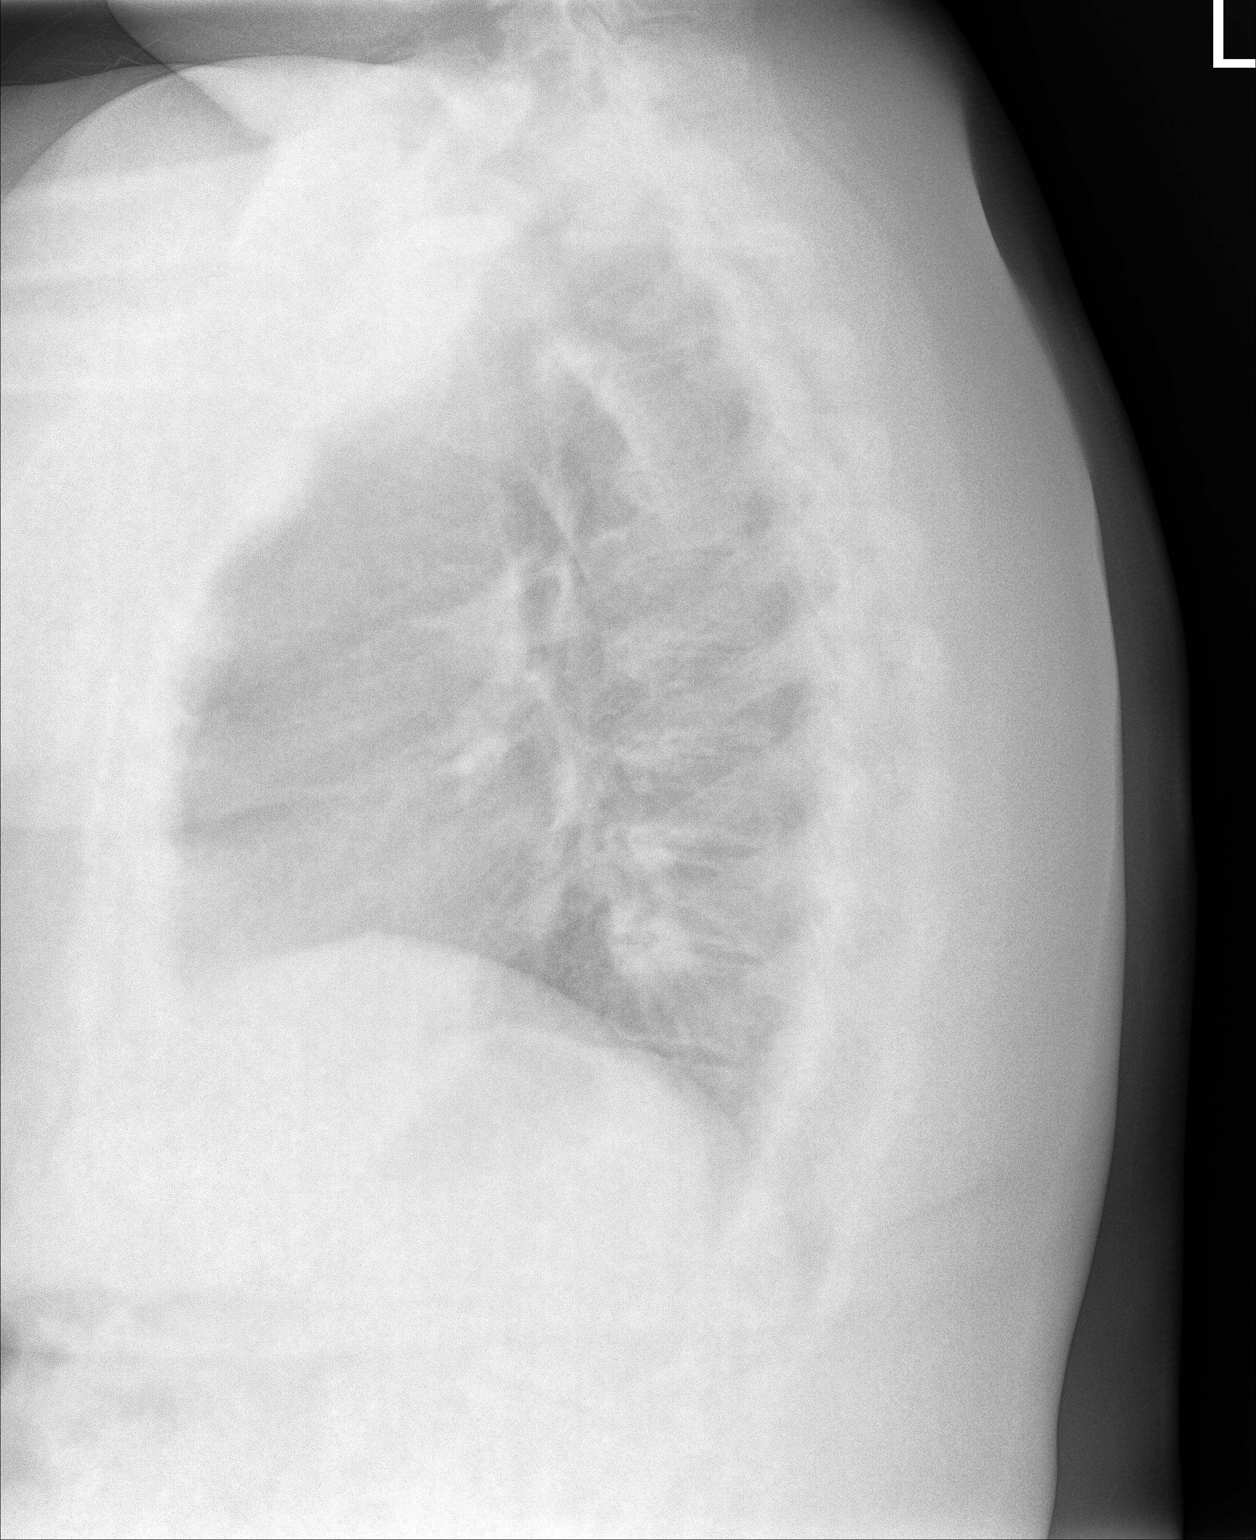

[2 of 2 positions shown; findings below may reference images not displayed]

FINDINGS: Right-sided aortic arch again noted. Heart size normal. No focal
infiltrate. No pleural effusion or pneumothorax. Degenerative change
thoracic spine.
IMPRESSION: Right-sided aortic arch again noted. No acute cardiopulmonary
disease. Chest is stable from prior exam.

## 2018-11-05 ENCOUNTER — Other Ambulatory Visit: Payer: Self-pay | Admitting: Nurse Practitioner

## 2018-11-06 ENCOUNTER — Other Ambulatory Visit (HOSPITAL_COMMUNITY): Payer: Self-pay | Admitting: Psychiatry

## 2018-11-06 MED ORDER — LAMOTRIGINE 150 MG PO TABS: 150.0000 mg | ORAL_TABLET | Freq: Every day | ORAL | 0 refills | Status: DC

## 2018-11-06 MED ORDER — SERTRALINE HCL 50 MG PO TABS: 50.0000 mg | ORAL_TABLET | Freq: Every day | ORAL | 0 refills | Status: DC

## 2018-11-06 MED ORDER — LURASIDONE HCL 20 MG PO TABS: 20.0000 mg | ORAL_TABLET | Freq: Every day | ORAL | 0 refills | Status: DC
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
## 2018-11-09 ENCOUNTER — Ambulatory Visit (HOSPITAL_COMMUNITY): Payer: Medicaid Other | Admitting: Psychiatry

## 2018-11-09 ENCOUNTER — Encounter (HOSPITAL_COMMUNITY): Payer: Self-pay | Admitting: Psychiatry

## 2018-11-09 DIAGNOSIS — F3132 Bipolar disorder, current episode depressed, moderate: Secondary | ICD-10-CM | POA: Diagnosis not present

## 2018-11-09 MED ORDER — CLONAZEPAM 0.5 MG PO TABS: ORAL_TABLET | ORAL | 0 refills | Status: DC

## 2018-11-09 MED ORDER — LURASIDONE HCL 20 MG PO TABS: 20.0000 mg | ORAL_TABLET | Freq: Every day | ORAL | 0 refills | Status: DC

## 2018-11-09 MED ORDER — SERTRALINE HCL 100 MG PO TABS: 100.0000 mg | ORAL_TABLET | Freq: Every day | ORAL | 1 refills | Status: DC

## 2018-11-09 MED ORDER — LAMOTRIGINE 150 MG PO TABS: 150.0000 mg | ORAL_TABLET | Freq: Every day | ORAL | 0 refills | Status: DC

## 2018-11-09 NOTE — Patient Instructions (Signed)
1. Increase sertraline100 mg at night 2.Continuelamotrigine 150 mg daily 3. Cotinue Latuda 20mg at night (akathisia like symptoms at higher dose) 4.Continue clonazepam 0.5 mg daily as needed for anxiety-  5.Return to clinic in two months for 15 mins

## 2018-12-01 ENCOUNTER — Encounter: Payer: Self-pay | Admitting: Gastroenterology

## 2018-12-01 ENCOUNTER — Encounter: Payer: Self-pay | Admitting: *Deleted

## 2018-12-01 ENCOUNTER — Other Ambulatory Visit: Payer: Self-pay | Admitting: *Deleted

## 2018-12-01 ENCOUNTER — Ambulatory Visit: Payer: Medicaid Other | Admitting: Gastroenterology

## 2018-12-01 ENCOUNTER — Telehealth: Payer: Self-pay

## 2018-12-01 VITALS — BP 125/75 | HR 100 | Temp 98.2°F | Ht 62.0 in | Wt 260.8 lb

## 2018-12-01 DIAGNOSIS — K219 Gastro-esophageal reflux disease without esophagitis: Secondary | ICD-10-CM | POA: Diagnosis not present

## 2018-12-01 DIAGNOSIS — R1310 Dysphagia, unspecified: Secondary | ICD-10-CM | POA: Diagnosis not present

## 2018-12-01 DIAGNOSIS — Z8371 Family history of colonic polyps: Secondary | ICD-10-CM

## 2018-12-01 DIAGNOSIS — Z83719 Family history of colon polyps, unspecified: Secondary | ICD-10-CM | POA: Insufficient documentation

## 2018-12-01 MED ORDER — PROMETHAZINE HCL 25 MG PO TABS: 12.5000 mg | ORAL_TABLET | Freq: Four times a day (QID) | ORAL | 0 refills | Status: DC | PRN

## 2018-12-01 NOTE — Patient Instructions (Addendum)
I would like for you to take Protonix in the morning, 30 minutes before breakfast on an empty stomach. It is best absorbed this way. Let us know in about 10-14 days how you are doing.  We have arranged an upper endoscopy with dilation by Dr. Darrick PennaFields in the near future.  I sent in Phenergan for nausea to use just as needed. This can cause drowsiness, dry mouth, and it is important not to take if driving. Just take 1/2 tablet as needed.  We will see you in follow-up after the procedure!  It was a pleasure to see you today. I strive to create trusting relationships with patients to provide genuine, compassionate, and quality care. I value your feedback. If you receive a survey regarding your visit,  I greatly appreciate you taking time to fill this out.   Gelene MinkAnna W. Boone, PhD, ANP-BC [facility]    Gastroesophageal Reflux Disease, Adult Gastroesophageal reflux (GER) happens when acid from the stomach flows up into the tube that connects the mouth and the stomach (esophagus). Normally, food travels down the esophagus and stays in the stomach to be digested. With GER, food and stomach acid sometimes move back up into the esophagus. You may have a disease called gastroesophageal reflux disease (GERD) if the reflux:  Happens often.  Causes frequent or very bad symptoms.  Causes problems such as damage to the esophagus. When this happens, the esophagus becomes sore and swollen (inflamed). Over time, GERD can make small holes (ulcers) in the lining of the esophagus. What are the causes? This condition is caused by a problem with the muscle between the esophagus and the stomach. When this muscle is weak or not normal, it does not close properly to keep food and acid from coming back up from the stomach. The muscle can be weak because of:  Tobacco use.  Pregnancy.  Having a certain type of hernia (hiatal hernia).  Alcohol use.  Certain foods and drinks, such as coffee, chocolate,  onions, and peppermint. What increases the risk? You are more likely to develop this condition if you:  Are overweight.  Have a disease that affects your connective tissue.  Use NSAID medicines. What are the signs or symptoms? Symptoms of this condition include:  Heartburn.  Difficult or painful swallowing.  The feeling of having a lump in the throat.  A bitter taste in the mouth.  Bad breath.  Having a lot of saliva.  Having an upset or bloated stomach.  Belching.  Chest pain. Different conditions can cause chest pain. Make sure you see your doctor if you have chest pain.  Shortness of breath or noisy breathing (wheezing).  Ongoing (chronic) cough or a cough at night.  Wearing away of the surface of teeth (tooth enamel).  Weight loss. How is this treated? Treatment will depend on how bad your symptoms are. Your doctor may suggest:  Changes to your diet.  Medicine.  Surgery. Follow these instructions at home: Eating and drinking   Follow a diet as told by your doctor. You may need to avoid foods and drinks such as: ? Coffee and tea (with or without caffeine). ? Drinks that contain alcohol. ? Energy drinks and sports drinks. ? Bubbly (carbonated) drinks or sodas. ? Chocolate and cocoa. ? Peppermint and mint flavorings. ? Garlic and onions. ? Horseradish. ? Spicy and acidic foods. These include peppers, chili powder, curry powder, vinegar, hot sauces, and BBQ sauce. ? Citrus fruit juices and citrus fruits, such as oranges,  lemons, and limes. ? Tomato-based foods. These include red sauce, chili, salsa, and pizza with red sauce. ? Fried and fatty foods. These include donuts, french fries, potato chips, and high-fat dressings. ? High-fat meats. These include hot dogs, rib eye steak, sausage, ham, and bacon. ? High-fat dairy items, such as whole milk, butter, and cream cheese.  Eat small meals often. Avoid eating large meals.  Avoid drinking large amounts  of liquid with your meals.  Avoid eating meals during the 2-3 hours before bedtime.  Avoid lying down right after you eat.  Do not exercise right after you eat. Lifestyle   Do not use any products that contain nicotine or tobacco. These include cigarettes, e-cigarettes, and chewing tobacco. If you need help quitting, ask your doctor.  Try to lower your stress. If you need help doing this, ask your doctor.  If you are overweight, lose an amount of weight that is healthy for you. Ask your doctor about a safe weight loss goal. General instructions  Pay attention to any changes in your symptoms.  Take over-the-counter and prescription medicines only as told by your doctor. Do not take aspirin, ibuprofen, or other NSAIDs unless your doctor says it is okay.  Wear loose clothes. Do not wear anything tight around your waist.  Raise (elevate) the head of your bed about 6 inches (15 cm).  Avoid bending over if this makes your symptoms worse.  Keep all follow-up visits as told by your doctor. This is important. Contact a doctor if:  You have new symptoms.  You lose weight and you do not know why.  You have trouble swallowing or it hurts to swallow.  You have wheezing or a cough that keeps happening.  Your symptoms do not get better with treatment.  You have a hoarse voice. Get help right away if:  You have pain in your arms, neck, jaw, teeth, or back.  You feel sweaty, dizzy, or light-headed.  You have chest pain or shortness of breath.  You throw up (vomit) and your throw-up looks like blood or coffee grounds.  You pass out (faint).  Your poop (stool) is bloody or black.  You cannot swallow, drink, or eat. Summary  If a person has gastroesophageal reflux disease (GERD), food and stomach acid move back up into the esophagus and cause symptoms or problems such as damage to the esophagus.  Treatment will depend on how bad your symptoms are.  Follow a diet as told by  your doctor.  Take all medicines only as told by your doctor. This information is not intended to replace advice given to you by your health care provider. Make sure you discuss any questions you have with your health care provider. Document Released: 05/03/2008 Document Revised: 05/24/2018 Document Reviewed: 05/24/2018 Elsevier Interactive Patient Education  2019 [employer]

## 2018-12-01 NOTE — Progress Notes (Addendum)
REVIEWED-DYSPHAGIA/GERD, BMI > 40.  Primary Care Physician:  Bennie PieriniMartin, Mary-Margaret, FNP/Dr. Ivin BootyJoshua Dettinger Primary Gastroenterologist:  Dr. Darrick PennaFields   Chief Complaint  Patient presents with  . Gastroesophageal Reflux    HPI:   Dawn Craig is a 48 y.o. female presenting today at the request of Bennie PieriniMartin, Mary-Margaret, FNP for GERD management.   GERD for about 2 years, worsening. Was on Zantac, now on Protonix, once a day. Not taking on an empty stomach in the morning. Taking Protonix at night with dinner. Has been on Protonix since October. Notes esophageal dysphagia "all the time". Breads, pizza crust, chips, popcorn. Eats meats rarely. Still with nausea that is constant, all day long. Takes Aleve. Used to take 3-4 times a week but now has not taken in the last 2 weeks. Has tried Prilosec in the remote past.   Hurting lower abdomen today. However, always has a burning sensation lower abdomen. Has urinary incontinence. Hasn't noticed any pattern with stomach discomfort. Present for the last 2 months and gotten worse. Prior to this would just be once or twice a week. Now an every day thing. BM every other day. No straining. Had episode N/V/D for 2 weeks in Nov 2019 and unable to complete stool sample as it stopped.   Past Medical History:  Diagnosis Date  . Anxiety   . Bipolar 1 disorder (HCC)   . GERD (gastroesophageal reflux disease)   . H/O sinus surgery   . Hyperlipidemia   . Hypertension   . Thyroid disease     Past Surgical History:  Procedure Laterality Date  . CESAREAN SECTION      Current Outpatient Medications  Medication Sig Dispense Refill  . betamethasone dipropionate (DIPROLENE) 0.05 % cream Apply topically 2 (two) times daily. 30 g 3  . cetirizine (ZYRTEC) 10 MG tablet Take 1 tablet (10 mg total) by mouth daily. 90 tablet 1  . Cholecalciferol (VITAMIN D) 2000 UNITS CAPS Take by mouth.    . clonazePAM (KLONOPIN) 0.5 MG tablet 0.5 mg daily as needed for anxiety. 30  tabs for 60 days 30 tablet 0  . fluticasone (FLONASE) 50 MCG/ACT nasal spray INSTILL 1 SPRAY INTO EACH NOSTRIL 2 TIMES DAILY 48 g 1  . hydrochlorothiazide (HYDRODIURIL) 25 MG tablet Take 1 tablet (25 mg total) by mouth daily. 30 tablet 5  . [START ON 12/06/2018] lamoTRIgine (LAMICTAL) 150 MG tablet Take 1 tablet (150 mg total) by mouth daily. 30 tablet 0  . levothyroxine (SYNTHROID, LEVOTHROID) 88 MCG tablet TAKE 1 TABLET (88 MCG TOTAL) BY MOUTH DAILY. 90 tablet 1  . losartan (COZAAR) 100 MG tablet Take 1 tablet (100 mg total) by mouth daily. 30 tablet 5  . [START ON 12/06/2018] lurasidone (LATUDA) 20 MG TABS tablet Take 1 tablet (20 mg total) by mouth daily with breakfast. 30 tablet 0  . medroxyPROGESTERone Acetate 150 MG/ML SUSY INJECT 1 ML (150 MG TOTAL) INTO THE MUSCLE EVERY 3 (THREE) MONTHS. 1 Syringe 1  . metoprolol succinate (TOPROL-XL) 50 MG 24 hr tablet Take 1 tablet (50 mg total) by mouth daily. Take with or immediately following a meal. 90 tablet 1  . naproxen (NAPROSYN) 500 MG tablet TAKE 1 TABLET (500 MG TOTAL) BY MOUTH 2 (TWO) TIMES DAILY WITH A MEAL. (Patient taking differently: Take 500 mg by mouth as needed. ) 60 tablet 0  . pantoprazole (PROTONIX) 40 MG tablet TAKE 1 TABLET BY MOUTH EVERY DAY 30 tablet 4  . sertraline (ZOLOFT) 100 MG tablet Take 1 tablet (  100 mg total) by mouth daily. 30 tablet 1  . tolterodine (DETROL LA) 4 MG 24 hr capsule TAKE 1 CAPSULE BY MOUTH EVERY DAY 90 capsule 0  . triamcinolone cream (KENALOG) 0.1 % Apply 1 application topically 2 (two) times daily. 30 g 0  . promethazine (PHENERGAN) 25 MG tablet Take 0.5 tablets (12.5 mg total) by mouth every 6 (six) hours as needed for nausea or vomiting. 30 tablet 0   No current facility-administered medications for this visit.     Allergies as of 12/01/2018 - Review Complete 12/01/2018  Allergen Reaction Noted  . Ace inhibitors Cough 03/20/2013  . Famvir [famciclovir] Hives and Swelling 03/20/2013  . Tetanus toxoids   03/20/2013    Family History  Problem Relation Age of Onset  . Hypertension Mother   . Cancer Mother        skin,face  . Colon polyps Mother        had to have surgery. Possible in 88s, early 13s   . Hypertension Father   . Hyperlipidemia Father   . Depression Maternal Aunt   . Bipolar disorder Cousin   . Depression Other   . Suicidality Other   . Colon cancer Neg Hx     Social History   Socioeconomic History  . Marital status: Divorced    Spouse name: Not on file  . Number of children: 1  . Years of education: Not on file  . Highest education level: Not on file  Occupational History  . Not on file  Social Needs  . Financial resource strain: Not on file  . Food insecurity:    Worry: Not on file    Inability: Not on file  . Transportation needs:    Medical: Not on file    Non-medical: Not on file  Tobacco Use  . Smoking status: Never Smoker  . Smokeless tobacco: Never Used  Substance and Sexual Activity  . Alcohol use: No  . Drug use: No  . Sexual activity: Not on file  Lifestyle  . Physical activity:    Days per week: Not on file    Minutes per session: Not on file  . Stress: Not on file  Relationships  . Social connections:    Talks on phone: Not on file    Gets together: Not on file    Attends religious service: Not on file    Active member of club or organization: Not on file    Attends meetings of clubs or organizations: Not on file    Relationship status: Not on file  . Intimate partner violence:    Fear of current or ex partner: Not on file    Emotionally abused: Not on file    Physically abused: Not on file    Forced sexual activity: Not on file  Other Topics Concern  . Not on file  Social History Narrative   48 year old female, 53 in May 2020.     Review of Systems: Gen: Denies any fever, chills, fatigue, weight loss, lack of appetite.  CV: Denies chest pain, heart palpitations, peripheral edema, syncope.  Resp: Denies shortness of breath  at rest or with exertion. Denies wheezing or cough.  GI: see HPI  GU : Denies urinary burning, urinary frequency, urinary hesitancy MS: Denies joint pain, muscle weakness, cramps, or limitation of movement.  Derm: Denies rash, itching, dry skin Psych: Denies depression, anxiety, memory loss, and confusion Heme: Denies bruising, bleeding, and enlarged lymph nodes.  Physical Exam: BP 125/75  Pulse 100   Temp 98.2 F (36.8 C) (Oral)   Ht 5\' 2"  (1.575 m)   Wt 260 lb 12.8 oz (118.3 kg)   BMI 47.70 kg/m  General:   Alert and oriented. Pleasant and cooperative. Well-nourished and well-developed.  Head:  Normocephalic and atraumatic. Eyes:  Without icterus, sclera clear and conjunctiva pink.  Ears:  Normal auditory acuity. Nose:  No deformity, discharge,  or lesions. Mouth:  No deformity or lesions, oral mucosa pink.  Lungs:  Clear to auscultation bilaterally. No wheezes, rales, or rhonchi. No distress.  Heart:  S1, S2 present without murmurs appreciated.  Abdomen:  +BS, soft, non-tender and non-distended. No HSM noted. No guarding or rebound. No masses appreciated.  Rectal:  Deferred  Msk:  Symmetrical without gross deformities. Normal posture. Extremities:  Without edema. Neurologic:  Alert and  oriented x4 Psych:  Alert and cooperative. Normal mood and affect.

## 2018-12-01 NOTE — Telephone Encounter (Signed)
Called and informed pt of pre-op appt 01/30/19 at 1:45pm. Letter mailed.

## 2018-12-04 NOTE — Assessment & Plan Note (Addendum)
48 year old female with chronic GERD now worsening despite daily PPI. Associated solid food dysphagia intermittently and constant nausea. Endorses Aleve several times a week. I do note she is taking Protonix with food at night, and we discussed taking on an empty stomach in the morning prior to breakfast. Will plan on EGD/dilation in the near future for further evaluation. Limit/avoid NSAIDs in interim.  Proceed with upper endoscopy/dilation in the near future with Dr. Darrick Penna. The risks, benefits, and alternatives have been discussed in detail with patient. They have stated understanding and desire to proceed.  Propofol due to polypharmacy Change Protonix to morning, 30 minutes before breakfast Zofran without nausea improvement. Phenergan low dose as needed short course GERD diet provided Follow-up in office thereafter

## 2018-12-04 NOTE — Assessment & Plan Note (Signed)
Notes her mother had an advanced polyp requiring resection in her 103s, possibly early 49s. As she is 79, would consider first colonoscopy now due to family history. Patient would like to address upper GI symptoms first. Will discuss this at next appt. No concerning lower GI signs/symptoms.

## 2018-12-04 NOTE — Assessment & Plan Note (Signed)
Dilation as appropriate.  

## 2018-12-05 NOTE — Progress Notes (Signed)
CC'ED TO PCP 

## 2018-12-07 ENCOUNTER — Other Ambulatory Visit: Payer: Self-pay | Admitting: Nurse Practitioner

## 2018-12-07 DIAGNOSIS — R000 Tachycardia, unspecified: Secondary | ICD-10-CM

## 2018-12-29 ENCOUNTER — Ambulatory Visit: Payer: Medicaid Other

## 2019-01-03 ENCOUNTER — Ambulatory Visit (INDEPENDENT_AMBULATORY_CARE_PROVIDER_SITE_OTHER): Payer: Medicaid Other | Admitting: *Deleted

## 2019-01-03 DIAGNOSIS — Z3042 Encounter for surveillance of injectable contraceptive: Secondary | ICD-10-CM

## 2019-01-03 MED ORDER — MEDROXYPROGESTERONE ACETATE 150 MG/ML IM SUSP
150.0000 mg | INTRAMUSCULAR | Status: AC
  Administered 2019-01-03 – 2020-09-05 (×8): 150 mg via INTRAMUSCULAR

## 2019-01-03 NOTE — Progress Notes (Signed)
Pt given Medroxyprogesterone inj Tolerated well 

## 2019-01-04 ENCOUNTER — Other Ambulatory Visit: Payer: Self-pay | Admitting: Nurse Practitioner

## 2019-01-04 DIAGNOSIS — N3941 Urge incontinence: Secondary | ICD-10-CM
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
## 2019-01-11 ENCOUNTER — Ambulatory Visit (HOSPITAL_COMMUNITY): Payer: Medicaid Other | Admitting: Psychiatry

## 2019-01-21 ENCOUNTER — Other Ambulatory Visit: Payer: Self-pay | Admitting: Nurse Practitioner

## 2019-01-21 DIAGNOSIS — E032 Hypothyroidism due to medicaments and other exogenous substances: Secondary | ICD-10-CM

## 2019-01-21 DIAGNOSIS — R000 Tachycardia, unspecified: Secondary | ICD-10-CM

## 2019-01-21 DIAGNOSIS — I10 Essential (primary) hypertension: Secondary | ICD-10-CM

## 2019-01-22 ENCOUNTER — Other Ambulatory Visit (HOSPITAL_COMMUNITY): Payer: Self-pay | Admitting: Psychiatry

## 2019-01-22 MED ORDER — SERTRALINE HCL 100 MG PO TABS: 100.0000 mg | ORAL_TABLET | Freq: Every day | ORAL | 0 refills | Status: DC
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
## 2019-01-30 ENCOUNTER — Other Ambulatory Visit (HOSPITAL_COMMUNITY): Payer: Self-pay

## 2019-01-31 ENCOUNTER — Encounter (HOSPITAL_COMMUNITY)
Admission: RE | Admit: 2019-01-31 | Discharge: 2019-01-31 | Disposition: A | Discharge status: Home / Self Care | Payer: Medicaid Other | Source: Ambulatory Visit | Attending: Gastroenterology | Admitting: Gastroenterology

## 2019-01-31 ENCOUNTER — Ambulatory Visit (INDEPENDENT_AMBULATORY_CARE_PROVIDER_SITE_OTHER): Payer: Medicaid Other | Admitting: Psychiatry

## 2019-01-31 ENCOUNTER — Telehealth: Payer: Self-pay

## 2019-01-31 ENCOUNTER — Encounter (HOSPITAL_COMMUNITY): Payer: Self-pay | Admitting: Psychiatry

## 2019-01-31 VITALS — BP 140/83 | HR 102 | Ht 62.0 in | Wt 259.0 lb

## 2019-01-31 DIAGNOSIS — F3181 Bipolar II disorder: Secondary | ICD-10-CM

## 2019-01-31 DIAGNOSIS — F3132 Bipolar disorder, current episode depressed, moderate: Secondary | ICD-10-CM | POA: Diagnosis not present

## 2019-01-31 MED ORDER — LURASIDONE HCL 20 MG PO TABS: 20.0000 mg | ORAL_TABLET | Freq: Every day | ORAL | 0 refills | Status: DC

## 2019-01-31 MED ORDER — SERTRALINE HCL 100 MG PO TABS: 100.0000 mg | ORAL_TABLET | Freq: Every day | ORAL | 0 refills | Status: DC

## 2019-01-31 MED ORDER — LAMOTRIGINE 200 MG PO TABS: 200.0000 mg | ORAL_TABLET | Freq: Every day | ORAL | 0 refills | Status: DC

## 2019-01-31 NOTE — Telephone Encounter (Signed)
Pt called office requesting to reschedule EGD/DIL w/Propofol w/SLF that was for 02/06/19 d/t she doesn't have a ride. Procedure rescheduled to 05/01/19 at 8:45am (next available). Called and informed endo scheduler. Pre-op rescheduled to 04/24/19 at 1:15pm. Called pt back and informed her of new pre-op appt. Letter mailed with new procedure instructions.

## 2019-01-31 NOTE — Patient Instructions (Signed)
1. Continue sertraline100 mg at night 2.Increaselamotrigine 200 mg daily 3. ContinueLatuda 20mg at night  4. Return to clinic in three months for 15 mins

## 2019-02-05 ENCOUNTER — Other Ambulatory Visit: Payer: Self-pay | Admitting: Nurse Practitioner

## 2019-02-05 DIAGNOSIS — N3941 Urge incontinence: Secondary | ICD-10-CM

## 2019-02-25 ENCOUNTER — Other Ambulatory Visit: Payer: Self-pay | Admitting: Nurse Practitioner

## 2019-03-05 ENCOUNTER — Other Ambulatory Visit: Payer: Self-pay | Admitting: Nurse Practitioner

## 2019-03-05 DIAGNOSIS — N3941 Urge incontinence: Secondary | ICD-10-CM

## 2019-03-21 ENCOUNTER — Other Ambulatory Visit: Payer: Self-pay | Admitting: Nurse Practitioner

## 2019-03-27 ENCOUNTER — Other Ambulatory Visit: Payer: Self-pay | Admitting: Nurse Practitioner

## 2019-03-30 ENCOUNTER — Ambulatory Visit: Payer: Medicaid Other

## 2019-04-02 ENCOUNTER — Ambulatory Visit (INDEPENDENT_AMBULATORY_CARE_PROVIDER_SITE_OTHER): Payer: Medicaid Other | Admitting: *Deleted

## 2019-04-02 ENCOUNTER — Other Ambulatory Visit: Payer: Self-pay

## 2019-04-02 DIAGNOSIS — Z3042 Encounter for surveillance of injectable contraceptive: Secondary | ICD-10-CM

## 2019-04-02 NOTE — Progress Notes (Signed)
Pt given Medroxyprogesterone inj Tolerated well 

## 2019-04-06 ENCOUNTER — Other Ambulatory Visit: Payer: Self-pay | Admitting: Nurse Practitioner

## 2019-04-06 DIAGNOSIS — N3941 Urge incontinence: Secondary | ICD-10-CM

## 2019-04-24 ENCOUNTER — Other Ambulatory Visit (HOSPITAL_COMMUNITY): Payer: Medicaid Other
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
## 2019-04-26 ENCOUNTER — Telehealth: Payer: Self-pay

## 2019-04-26 NOTE — Telephone Encounter (Signed)
Informed by endo scheduler that pt was no show for pre-op appt 04/24/19. Pt also needs COVID test prior to procedure 05/01/19. Endo scheduler LMOVM for pt yesterday to call her. She was also unable to reach pt this morning.  I tried to call pt, no answer, LMOVM and LMOAM for return call. Informed endo scheduler. If pt doesn't return call today, procedure will have to be cancelled.

## 2019-04-29 ENCOUNTER — Other Ambulatory Visit: Payer: Self-pay | Admitting: Nurse Practitioner

## 2019-04-29 DIAGNOSIS — R000 Tachycardia, unspecified: Secondary | ICD-10-CM

## 2019-04-29 DIAGNOSIS — I10 Essential (primary) hypertension: Secondary | ICD-10-CM

## 2019-04-30 ENCOUNTER — Other Ambulatory Visit (HOSPITAL_COMMUNITY): Payer: Self-pay | Admitting: Psychiatry

## 2019-04-30 MED ORDER — LAMOTRIGINE 200 MG PO TABS: 200.0000 mg | ORAL_TABLET | Freq: Every day | ORAL | 0 refills | Status: DC

## 2019-04-30 NOTE — Telephone Encounter (Signed)
Endo scheduler advised pt didn't call back to reschedule pre-op/COVID test. Procedure for 05/01/19 was cancelled.

## 2019-05-01 ENCOUNTER — Ambulatory Visit (HOSPITAL_COMMUNITY): Admission: RE | Admit: 2019-05-01 | Payer: Medicaid Other | Source: Home / Self Care | Admitting: Gastroenterology

## 2019-05-01 ENCOUNTER — Encounter (HOSPITAL_COMMUNITY): Admission: RE | Payer: Self-pay | Source: Home / Self Care

## 2019-05-01 SURGERY — ESOPHAGOGASTRODUODENOSCOPY (EGD) WITH PROPOFOL
Anesthesia: Monitor Anesthesia Care

## 2019-05-03 ENCOUNTER — Other Ambulatory Visit: Payer: Self-pay

## 2019-05-03 ENCOUNTER — Ambulatory Visit (INDEPENDENT_AMBULATORY_CARE_PROVIDER_SITE_OTHER): Payer: Medicaid Other | Admitting: Psychiatry

## 2019-05-03 ENCOUNTER — Encounter (HOSPITAL_COMMUNITY): Payer: Self-pay | Admitting: Psychiatry

## 2019-05-03 DIAGNOSIS — F3181 Bipolar II disorder: Secondary | ICD-10-CM

## 2019-05-03 MED ORDER — SERTRALINE HCL 100 MG PO TABS: 100.0000 mg | ORAL_TABLET | Freq: Every day | ORAL | 0 refills | Status: DC

## 2019-05-03 MED ORDER — LURASIDONE HCL 20 MG PO TABS: 20.0000 mg | ORAL_TABLET | Freq: Every day | ORAL | 0 refills | Status: DC

## 2019-05-03 NOTE — Patient Instructions (Addendum)
1.Continue sertraline100 mg at night 2.Continuelamotrigine 200 mg daily 3. ContinueLatuda 20mg daily  4. Referral to therapy   5. Next appointment: 8/27 at 2 PM

## 2019-05-06 ENCOUNTER — Other Ambulatory Visit: Payer: Self-pay | Admitting: Nurse Practitioner

## 2019-05-06 DIAGNOSIS — N3941 Urge incontinence: Secondary | ICD-10-CM

## 2019-05-07 NOTE — Telephone Encounter (Signed)
NTBS.

## 2019-05-16 ENCOUNTER — Ambulatory Visit: Payer: Medicaid Other | Admitting: Gastroenterology

## 2019-05-18 ENCOUNTER — Encounter (HOSPITAL_COMMUNITY): Payer: Self-pay | Admitting: Licensed Clinical Social Worker

## 2019-05-18 ENCOUNTER — Ambulatory Visit (INDEPENDENT_AMBULATORY_CARE_PROVIDER_SITE_OTHER): Payer: Medicaid Other | Admitting: Licensed Clinical Social Worker

## 2019-05-18 ENCOUNTER — Other Ambulatory Visit: Payer: Self-pay

## 2019-05-18 DIAGNOSIS — F410 Panic disorder [episodic paroxysmal anxiety] without agoraphobia: Secondary | ICD-10-CM | POA: Diagnosis not present

## 2019-05-18 DIAGNOSIS — F3181 Bipolar II disorder: Secondary | ICD-10-CM | POA: Diagnosis not present

## 2019-05-18 NOTE — Progress Notes (Signed)
Comprehensive Clinical Assessment (CCA) Note  05/18/2019 Dawn Craig [id number]  Visit Diagnosis:      ICD-10-CM   1. Bipolar II disorder ([place])  F31.81       CCA Part One  Part One has been completed on paper by the patient.  (See scanned document in Chart Review)  CCA Part Two A  Intake/Chief Complaint:  CCA Intake With Chief Complaint CCA Part Two Date: 05/18/19 CCA Part Two Time: 35 Chief Complaint/Presenting Problem: Bipolar and Anxiety Patients Currently Reported Symptoms/Problems: Mood: lay in bed, isolates, low energy, poor hygiene, difficulty with focus, appetite flucuates, gained weight but not sure, sleep flucuates, irritability, tearfulness at times, feels numb, feelings of hopelessness, feelings of worthlessness, sees shadow figures out of corner of her eye or run by,     Anxiety: pulling hair, doesn't leave the home, panic attacks, doesn't like being around people, nervous, fearful of having a panic attack, does things in 3's, Collateral Involvement: None Individual's Strengths: None identified at this time Individual's Preferences: Doesn't like being around people, Prefers to be at home Individual's Abilities: Garden, play piano Type of Services Patient Feels Are Needed: Therapy, medication management Initial Clinical Notes/Concerns: Symptoms started around when she got married and moved away from her parents (age 83), symptoms occur daily, symptoms are severe per patient  Mental Health Symptoms Depression:  Depression: Change in energy/activity, Difficulty Concentrating, Fatigue, Hopelessness, Increase/decrease in appetite, Irritability, Sleep (too much or little), Worthlessness, Weight gain/loss, Tearfulness  Mania:  Mania: N/A  Anxiety:   Anxiety: Worrying, Tension, Sleep, Restlessness, Irritability, Fatigue, Difficulty concentrating  Psychosis:  Psychosis: N/A  Trauma:  Trauma: N/A  Obsessions:  Obsessions: N/A  Compulsions:  Compulsions: N/A  Inattention:   Inattention: N/A  Hyperactivity/Impulsivity:  Hyperactivity/Impulsivity: N/A  Oppositional/Defiant Behaviors:  Oppositional/Defiant Behaviors: N/A  Borderline Personality:  Emotional Irregularity: N/A  Other Mood/Personality Symptoms:  Other Mood/Personality Symtpoms: N/A   Mental Status Exam Appearance and self-care  Stature:  Stature: Average  Weight:  Weight: Average weight  Clothing:  Clothing: Casual  Grooming:  Grooming: Normal  Cosmetic use:  Cosmetic Use: None  Posture/gait:  Posture/Gait: Normal  Motor activity:  Motor Activity: Not Remarkable  Sensorium  Attention:  Attention: Normal  Concentration:  Concentration: Normal  Orientation:  Orientation: X5  Recall/memory:  Recall/Memory: Normal  Affect and Mood  Affect:  Affect: Appropriate  Mood:  Mood: Depressed  Relating  Eye contact:  Eye Contact: Normal  Facial expression:  Facial Expression: Depressed  Attitude toward examiner:  Attitude Toward Examiner: Cooperative  Thought and Language  Speech flow: Speech Flow: Normal  Thought content:  Thought Content: Appropriate to mood and circumstances  Preoccupation:  Preoccupations: (N/A)  Hallucinations:  Hallucinations: (N/A)  Organization:   Logical   Transport planner of Knowledge:  Fund of Knowledge: Average  Intelligence:  Intelligence: Average  Abstraction:  Abstraction: Normal  Judgement:  Judgement: Fair  Art therapist:  Reality Testing: Adequate  Insight:  Insight: Fair  Decision Making:  Decision Making: Normal  Social Functioning  Social Maturity:  Social Maturity: Isolates  Social Judgement:  Social Judgement: Normal  Stress  Stressors:  Stressors: Illness, Grief/losses  Coping Ability:  Coping Ability: Normal  Skill Deficits:   Daily life  Supports:   Son   Family and Psychosocial History: Family history Marital status: Divorced Divorced, when?: 2009 What types of issues is patient dealing with in the relationship?: None Additional  relationship information: None Are you sexually active?: No What is  your sexual orientation?: Heterosexual Has your sexual activity been affected by drugs, alcohol, medication, or emotional stress?: Emotional stress Does patient have children?: Yes How many children?: 1 How is patient's relationship with their children?: Son, close  Childhood History:  Childhood History By whom was/is the patient raised?: Both parents Additional childhood history information: Patient describes childhood as loving. Description of patient's relationship with caregiver when they were a child: Mother: Good    Father:  limited-he worked a Event organiserlot Patient's description of current relationship with people who raised him/her: Mother: Deceased   Father: deceased How were you disciplined when you got in trouble as a child/adolescent?: Spanked, grounded Does patient have siblings?: Yes Number of Siblings: 1 Description of patient's current relationship with siblings: Sister, not very close but are in constant contact Did patient suffer any verbal/emotional/physical/sexual abuse as a child?: No Did patient suffer from severe childhood neglect?: No Has patient ever been sexually abused/assaulted/raped as an adolescent or adult?: No Was the patient ever a victim of a crime or a disaster?: No Witnessed domestic violence?: No Has patient been effected by domestic violence as an adult?: No  CCA Part Two B  Employment/Work Situation: Employment / Work Psychologist, occupationalituation Employment situation: On disability Why is patient on disability: mental health, back issues How long has patient been on disability: 2001 Patient's job has been impacted by current illness: No What is the longest time patient has a held a job?: 4 years Where was the patient employed at that time?: Engineer, maintenance ([employer] Did You Receive Any Psychiatric Treatment/Services While in the [employer]?: No Are There Guns or Other Weapons in Your Home?: Yes Types of  Guns/Weapons: Arts administratorrifles Are These Weapons Safely Secured?: Yes  Education: Education School Currently Attending: N/A Last Grade Completed: 12 Name of High School: [employer] Did Garment/textile technologistYou Graduate From [employer] School?: Yes Did Theme park managerYou Attend College?: No Did Designer, television/film setYou Attend Graduate School?: No Did You Have Any Special Interests In School?: Didn't like school Did You Have An Individualized Education Program (IIEP): No Did You Have Any Difficulty At [employer] EnergySchool?: No  Religion: Religion/Spirituality Are You A Religious Person?: No How Might This Affect Treatment?: No impact  Leisure/Recreation: Leisure / Recreation Leisure and Hobbies: used to garden, used to play piano  Exercise/Diet: Exercise/Diet Do You Exercise?: No Have You Gained or Lost A Significant Amount of Weight in the Past Six Months?: No Do You Follow a Special Diet?: No Do You Have Any Trouble Sleeping?: Yes Explanation of Sleeping Difficulties: Sleep flucuates  CCA Part Two C  Alcohol/Drug Use: Alcohol / Drug Use Pain Medications: Denies Prescriptions: Denies Over the Counter: Denies History of alcohol / drug use?: No history of alcohol / drug abuse                      CCA Part Three  ASAM's:  Six Dimensions of Multidimensional Assessment  Dimension 1:  Acute Intoxication and/or Withdrawal Potential:  Dimension 1:  Comments: None  Dimension 2:  Biomedical Conditions and Complications:  Dimension 2:  Comments: None  Dimension 3:  Emotional, Behavioral, or Cognitive Conditions and Complications:  Dimension 3:  Comments: None  Dimension 4:  Readiness to Change:  Dimension 4:  Comments: None  Dimension 5:  Relapse, Continued use, or Continued Problem Potential:  Dimension 5:  Comments: None  Dimension 6:  Recovery/Living Environment:  Dimension 6:  Recovery/Living Environment Comments: None   Substance use Disorder (SUD)    Social Function:  Social Functioning  Social Maturity: Isolates Social Judgement:  Normal  Stress:  Stress Stressors: Illness, Grief/losses Coping Ability: Normal Patient Takes Medications The Way The Doctor Instructed?: Yes Priority Risk: Low Acuity  Risk Assessment- Self-Harm Potential: Risk Assessment For Self-Harm Potential Thoughts of Self-Harm: No current thoughts Method: No plan Availability of Means: No access/NA  Risk Assessment -Dangerous to Others Potential: Risk Assessment For Dangerous to Others Potential Method: No Plan Availability of Means: No access or NA Intent: Vague intent or NA Notification Required: No need or identified person  DSM5 Diagnoses: Patient Active Problem List   Diagnosis Date Noted  . Dysphagia 12/01/2018  . Family history of colonic polyps 12/01/2018  . Bipolar II disorder (HCC) 04/04/2018  . Binge eating disorder 08/21/2014  . Severe obesity (BMI >= 40) (HCC) 03/15/2014  . Hyperlipidemia with target LDL less than 100 03/20/2013  . Essential hypertension, benign 03/20/2013  . Agoraphobia with panic attacks 03/20/2013  . Urinary incontinence 03/20/2013  . Hypothyroidism 03/20/2013  . Migraines 03/20/2013  . GERD (gastroesophageal reflux disease) 03/20/2013  . 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   Referrals to Alternative Service(s): Referred to Alternative Service(s):   Place:   Date:   Time:    Referred to Alternative Service(s):   Place:   Date:   Time:    Referred to  Alternative Service(s):   Place:   Date:   Time:    Referred to Alternative Service(s):   Place:   Date:   Time:     Bynum BellowsJoshua Micala Saltsman, LCSW

## 2019-05-26 ENCOUNTER — Other Ambulatory Visit: Payer: Self-pay | Admitting: Nurse Practitioner

## 2019-05-26 DIAGNOSIS — R000 Tachycardia, unspecified: Secondary | ICD-10-CM

## 2019-05-28 NOTE — Telephone Encounter (Signed)
[id number]-cb 6/29 

## 2019-05-28 NOTE — Telephone Encounter (Signed)
MMM NTBS 30 days given 04/02/19 

## 2019-05-29 ENCOUNTER — Telehealth: Payer: Self-pay | Admitting: Nurse Practitioner

## 2019-05-29 NOTE — Telephone Encounter (Signed)
Patient notified and telephone visit scheduled per patients request

## 2019-06-04 ENCOUNTER — Other Ambulatory Visit: Payer: Self-pay | Admitting: Nurse Practitioner

## 2019-06-04 DIAGNOSIS — I10 Essential (primary) hypertension: Secondary | ICD-10-CM

## 2019-06-05 NOTE — Telephone Encounter (Signed)
NTBS.

## 2019-06-05 NOTE — Telephone Encounter (Signed)
Has apt 7/13.

## 2019-06-11 ENCOUNTER — Telehealth: Payer: Self-pay | Admitting: *Deleted

## 2019-06-11 ENCOUNTER — Ambulatory Visit (INDEPENDENT_AMBULATORY_CARE_PROVIDER_SITE_OTHER): Payer: Medicaid Other | Admitting: Nurse Practitioner

## 2019-06-11 ENCOUNTER — Encounter: Payer: Self-pay | Admitting: Nurse Practitioner

## 2019-06-11 DIAGNOSIS — E785 Hyperlipidemia, unspecified: Secondary | ICD-10-CM | POA: Diagnosis not present

## 2019-06-11 DIAGNOSIS — E032 Hypothyroidism due to medicaments and other exogenous substances: Secondary | ICD-10-CM

## 2019-06-11 DIAGNOSIS — R000 Tachycardia, unspecified: Secondary | ICD-10-CM | POA: Diagnosis not present

## 2019-06-11 DIAGNOSIS — F3181 Bipolar II disorder: Secondary | ICD-10-CM

## 2019-06-11 DIAGNOSIS — F3132 Bipolar disorder, current episode depressed, moderate: Secondary | ICD-10-CM | POA: Diagnosis not present

## 2019-06-11 DIAGNOSIS — F5081 Binge eating disorder: Secondary | ICD-10-CM | POA: Diagnosis not present

## 2019-06-11 DIAGNOSIS — K219 Gastro-esophageal reflux disease without esophagitis: Secondary | ICD-10-CM | POA: Diagnosis not present

## 2019-06-11 DIAGNOSIS — G43711 Chronic migraine without aura, intractable, with status migrainosus: Secondary | ICD-10-CM | POA: Diagnosis not present

## 2019-06-11 DIAGNOSIS — N3941 Urge incontinence: Secondary | ICD-10-CM

## 2019-06-11 DIAGNOSIS — F4001 Agoraphobia with panic disorder: Secondary | ICD-10-CM

## 2019-06-11 DIAGNOSIS — I10 Essential (primary) hypertension: Secondary | ICD-10-CM | POA: Diagnosis not present

## 2019-06-11 MED ORDER — LEVOTHYROXINE SODIUM 88 MCG PO TABS: ORAL_TABLET | ORAL | 1 refills | Status: DC

## 2019-06-11 MED ORDER — LAMOTRIGINE 200 MG PO TABS: 200.0000 mg | ORAL_TABLET | Freq: Every day | ORAL | 0 refills | Status: DC

## 2019-06-11 MED ORDER — PANTOPRAZOLE SODIUM 40 MG PO TBEC: 40.0000 mg | DELAYED_RELEASE_TABLET | Freq: Every day | ORAL | 4 refills | Status: DC

## 2019-06-11 MED ORDER — TOLTERODINE TARTRATE ER 4 MG PO CP24: 4.0000 mg | ORAL_CAPSULE | Freq: Every day | ORAL | 0 refills | Status: DC

## 2019-06-11 MED ORDER — LOSARTAN POTASSIUM 100 MG PO TABS: 100.0000 mg | ORAL_TABLET | Freq: Every day | ORAL | 2 refills | Status: DC

## 2019-06-11 MED ORDER — CLONAZEPAM 0.5 MG PO TABS: ORAL_TABLET | ORAL | 0 refills | Status: DC

## 2019-06-11 MED ORDER — SERTRALINE HCL 100 MG PO TABS: 100.0000 mg | ORAL_TABLET | Freq: Every day | ORAL | 0 refills | Status: DC

## 2019-06-11 MED ORDER — LURASIDONE HCL 20 MG PO TABS: 20.0000 mg | ORAL_TABLET | Freq: Every day | ORAL | 0 refills | Status: DC

## 2019-06-11 MED ORDER — METOPROLOL SUCCINATE ER 50 MG PO TB24: ORAL_TABLET | ORAL | 2 refills | Status: DC

## 2019-06-11 MED ORDER — HYDROCHLOROTHIAZIDE 25 MG PO TABS: 25.0000 mg | ORAL_TABLET | Freq: Every day | ORAL | 2 refills | Status: DC

## 2019-06-11 NOTE — Progress Notes (Signed)
Virtual Visit via telephone Note  I connected with Dawn Craig on 06/11/19 at 11:10 by telephone and verified that I am speaking with the correct person using two identifiers. Dawn Craig is currently located at home and no one is currently with her during visit. The provider, Mary-Margaret Daphine DeutscherMartin, FNP is located in their office at time of visit.  I discussed the limitations, risks, security and privacy concerns of performing an evaluation and management service by telephone and the availability of in person appointments. I also discussed with the patient that there may be a patient responsible charge related to this service. The patient expressed understanding and agreed to proceed.   History and Present Illness:   Chief Complaint: Medical Management of Chronic Issues    HPI:  1. Essential hypertension, benign No c/o chest pain, sob or headache. Does not check blood pressure at home. BP Readings from Last 3 Encounters:  12/01/18 125/75  10/18/18 131/85  09/06/18 (!) 153/90     2. Hyperlipidemia with target LDL less than 100 Does not watch fats in diet. Does not exercise  3. Gastroesophageal reflux disease, esophagitis presence not specified Is on protonix and that works well for her  4. 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 Intractable chronic migraine without aura and with status migrainosus Has had several migraines in the past but did not last long.  6. Agoraphobia with panic attacks Has not been out of house in over 2 months  7. Bipolar II disorder (HCC) Is on latuda, lamictal and zoloft. She is having a hard time because her son is in hospital with cellulitis of his arm and is going to have to have surgery  8. Binge eating disorder Still binge eating  9. Severe obesity (BMI >= 40) (HCC) Has gained about 5 lbs since last visit    Outpatient Encounter Medications as of 06/11/2019  Medication Sig  .  betamethasone dipropionate (DIPROLENE) 0.05 % cream Apply topically 2 (two) times daily. (Patient taking differently: Apply 1 application topically 2 (two) times daily as needed (ezcema.). )  . cetirizine (ZYRTEC) 10 MG tablet TAKE 1 TABLET BY MOUTH EVERY DAY  . Cholecalciferol (VITAMIN D) 2000 UNITS CAPS Take 2,000 Units by mouth daily.   . clonazePAM (KLONOPIN) 0.5 MG tablet 0.5 mg daily as needed for anxiety. 30 tabs for 60 days (Patient taking differently: Take 0.5 mg by mouth at bedtime as needed for anxiety. )  . fluticasone (FLONASE) 50 MCG/ACT nasal spray INSTILL 1 SPRAY INTO EACH NOSTRIL 2 TIMES DAILY  . hydrochlorothiazide (HYDRODIURIL) 25 MG tablet TAKE 1 TABLET BY MOUTH EVERY DAY (Patient taking differently: Take 25 mg by mouth daily. )  . lamoTRIgine (LAMICTAL) 200 MG tablet Take 1 tablet (200 mg total) by mouth daily.  Marland Kitchen. levothyroxine (SYNTHROID, LEVOTHROID) 88 MCG tablet TAKE 1 TABLET BY MOUTH EVERY DAY (Patient taking differently: Take 88 mcg by mouth daily before breakfast. )  . losartan (COZAAR) 100 MG tablet TAKE 1 TABLET BY MOUTH EVERY DAY (Patient taking differently: Take 100 mg by mouth daily. )  . lurasidone (LATUDA) 20 MG TABS tablet Take 1 tablet (20 mg total) by mouth daily with breakfast.  . medroxyPROGESTERone Acetate 150 MG/ML SUSY INJECT 1 ML (150 MG TOTAL) INTO THE MUSCLE EVERY 3 (THREE) MONTHS.  Marland Kitchen. metoprolol succinate (TOPROL-XL) 50 MG 24 hr tablet TAKE 1 TABLET BY MOUTH DAILY. TAKE WITH OR IMMEDIATELY FOLLOWING A MEAL. (NEEDS TO BE SEEN  BEFORE NEXT REFILL) (Patient taking differently: Take 50 mg by mouth every evening. )  . naproxen (NAPROSYN) 500 MG tablet TAKE 1 TABLET (500 MG TOTAL) BY MOUTH 2 (TWO) TIMES DAILY WITH A MEAL. (Patient taking differently: Take 500 mg by mouth 2 (two) times daily as needed (pain). )  . naproxen sodium (ALEVE) 220 MG tablet Take 220-440 mg by mouth 2 (two) times daily as needed (for pain.).  Marland Kitchen. pantoprazole (PROTONIX) 40 MG tablet TAKE 1  TABLET BY MOUTH EVERY DAY (Patient taking differently: Take 40 mg by mouth daily before breakfast. )  . promethazine (PHENERGAN) 25 MG tablet Take 0.5 tablets (12.5 mg total) by mouth every 6 (six) hours as needed for nausea or vomiting.  . sertraline (ZOLOFT) 100 MG tablet Take 1 tablet (100 mg total) by mouth daily.  Marland Kitchen. tolterodine (DETROL LA) 4 MG 24 hr capsule Take 1 capsule (4 mg total) by mouth daily. (Needs to be seen before next refill)  . triamcinolone cream (KENALOG) 0.1 % Apply 1 application topically 2 (two) times daily. (Patient taking differently: Apply 1 application topically 2 (two) times daily as needed (ezcema). )     Past Surgical History:  Procedure Laterality Date  . CESAREAN SECTION      Family History  Problem Relation Age of Onset  . Hypertension Mother   . Cancer Mother        skin,face  . Colon polyps Mother        had to have surgery. Possible in 6150s, early 3560s   . Hypertension Father   . Hyperlipidemia Father   . Depression Maternal Aunt   . Bipolar disorder Cousin   . Depression Other   . Suicidality Other   . Colon cancer Neg Hx     New complaints: None today  Social history: Again son is in hospital  Controlled substance contract: will do at next visit     Review of Systems  Constitutional: Negative for diaphoresis and weight loss.  Eyes: Negative for blurred vision, double vision and pain.  Respiratory: Negative for shortness of breath.   Cardiovascular: Negative for chest pain, palpitations, orthopnea and leg swelling.  Gastrointestinal: Negative for abdominal pain.  Skin: Negative for rash.  Neurological: Negative for dizziness, sensory change, loss of consciousness, weakness and headaches.  Endo/Heme/Allergies: Negative for polydipsia. Does not bruise/bleed easily.  Psychiatric/Behavioral: Positive for depression. Negative for memory loss. The patient is nervous/anxious. The patient does not have insomnia.   All other systems  reviewed and are negative.    Observations/Objective: Alert and oriented- answers all questions appropriately Mild distress  Assessment and Plan: Dawn Craig comes in today with chief complaint of Medical Management of Chronic Issues   Diagnosis and orders addressed:  1. Essential hypertension, benign Low sodium diet - losartan (COZAAR) 100 MG tablet; Take 1 tablet (100 mg total) by mouth daily.  Dispense: 30 tablet; Refill: 2 - hydrochlorothiazide (HYDRODIURIL) 25 MG tablet; Take 1 tablet (25 mg total) by mouth daily.  Dispense: 30 tablet; Refill: 2  2. Hyperlipidemia with target LDL less than 100 Low fat diet  3. Gastroesophageal reflux disease, esophagitis presence not specified Avoid spicy foods Do not eat 2 hours prior to bedtime - pantoprazole (PROTONIX) 40 MG tablet; Take 1 tablet (40 mg total) by mouth daily.  Dispense: 30 tablet; Refill: 4  4. 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 Intractable chronic migraine without aura and with status migrainosus Avoid caffeine  6. Agoraphobia with panic attacks Has worsened Keep follow up with therapist  7. Bipolar II disorder (HCC) Continue all meds - lurasidone (LATUDA) 20 MG TABS tablet; Take 1 tablet (20 mg total) by mouth daily with breakfast.  Dispense: 90 tablet; Refill: 0 - sertraline (ZOLOFT) 100 MG tablet; Take 1 tablet (100 mg total) by mouth daily.  Dispense: 90 tablet; Refill: 0 - lamoTRIgine (LAMICTAL) 200 MG tablet; Take 1 tablet (200 mg total) by mouth daily.  Dispense: 90 tablet; Refill: 0 - clonazePAM (KLONOPIN) 0.5 MG tablet; 0.5 mg daily as needed for anxiety. 30 tabs for 60 days  Dispense: 60 tablet; Refill: 0  8. Binge eating disorder  9. Severe obesity (BMI >= 40) (HCC) Discussed diet and exercise for person with BMI >25 Will recheck weight in 3-6 months  10. Sinus  tachycardia Avoid caffeine - metoprolol succinate (TOPROL-XL) 50 MG 24 hr tablet; TAKE 1 TABLET BY MOUTH DAILY. TAKE WITH OR IMMEDIATELY FOLLOWING A MEAL. (NEEDS TO BE SEEN BEFORE NEXT REFILL)  Dispense: 30 tablet; Refill: 2  11. Urge incontinence of urine - tolterodine (DETROL LA) 4 MG 24 hr capsule; Take 1 capsule (4 mg total) by mouth daily. (Needs to be seen before next refill)  Dispense: 30 capsule; Refill: 0   Labs pending Health Maintenance reviewed Diet and exercise encouraged  Follow up plan: In 3 months- will need  To make appointment      I discussed the assessment and treatment plan with the patient. The patient was provided an opportunity to ask questions and all were answered. The patient agreed with the plan and demonstrated an understanding of the instructions.   The patient was advised to call back or seek an in-person evaluation if the symptoms worsen or if the condition fails to improve as anticipated.  The above assessment and management plan was discussed with the patient. The patient verbalized understanding of and has agreed to the management plan. Patient is aware to call the clinic if symptoms persist or worsen. Patient is aware when to return to the clinic for a follow-up visit. Patient educated on when it is appropriate to go to the emergency department.   Time call ended:  11:30  I provided 20 minutes of non-face-to-face time during this encounter.    Mary-Margaret Hassell Done, FNP

## 2019-06-11 NOTE — Telephone Encounter (Addendum)
Prior Auth request for Tolterodine Tartrate ER 4mg  (Detrol LA)-APPROVED till 06/05/2020   PA# [id number]  Spoke with Butch Penny at [facility] [phone]) call reference # [id number]

## 2019-06-18 ENCOUNTER — Encounter (HOSPITAL_COMMUNITY): Payer: Self-pay | Admitting: Licensed Clinical Social Worker

## 2019-06-18 ENCOUNTER — Other Ambulatory Visit: Payer: Self-pay

## 2019-06-18 ENCOUNTER — Ambulatory Visit (INDEPENDENT_AMBULATORY_CARE_PROVIDER_SITE_OTHER): Payer: Medicaid Other | Admitting: Licensed Clinical Social Worker

## 2019-06-18 DIAGNOSIS — F3181 Bipolar II disorder: Secondary | ICD-10-CM

## 2019-06-18 NOTE — Progress Notes (Signed)
Virtual Visit via Telephone Note  I connected with Dawn Craig on 06/18/19 at 11:00 AM EDT by telephone and verified that I am speaking with the correct person using two identifiers.  Location: Patient: Home Provider: Office   I discussed the limitations, risks, security and privacy concerns of performing an evaluation and management service by telephone and the availability of in person appointments. I also discussed with the patient that there may be a patient responsible charge related to this service. The patient expressed understanding and agreed to proceed.  Participation Level: Active  Behavioral Response: CasualAlertDepressed  Type of Therapy: Individual Therapy  Treatment Goals addressed: Coping  Interventions: CBT and Solution Focused  Summary: Dawn Craig is a 48 y.o. female who presents oriented x5 (person, place, situation, time and object), casually dressed, appropriately groomed, average height, overweight, and cooperative to address mood. Patient has a history of medical treatment including migraines and hypertension. Patient has a history of mental health treatment including outpatient therapy and medication management. Patient denies suicidal and homicidal ideations. Patient denies psychosis including auditory and visual hallucinations. Patient denies substance abuse. Patient is at low risk for lethality at this time.  Physically: Patient is tired. She is staying in bed and has not been showering. She doesn't leave the house much at all. She has been sick as well. Spiritually/values: No issues identified.  Relationships: Patient has limited interactions. Patient had her cousin come over and help her with dishes. Patient's son lives with her as well. She has little interaction with others outside of that.  Emotionally/Mentally/Behavior:  Patient has been down. She has been isolating and not taking care of herself. Patient was able to identify that a small step she can take  is to start showering. She has not showered since March. Patient agreed to start showering once a week. She has felt anxious about getting into the shower due to it being an inclosed area and she feels like she can't breathe. Patient understood that she can breathe and that she needs to take slow, deep breaths when she is taking a short shower.   Patient engaged in session. Patient responded well to interventions. Patient continues to meet criteria for Bipolar II disorder. Patient will continue in outpatient therapy due to being the least restrictive service to meet her needs.   Suicidal/Homicidal: Negativewithout intent/plan  Therapist Response: Therapist reviewed patient's recent thoughts and behaviors. Therapist utilized CBT to address mood. Therapist processed patient's feelings to identify triggers for mood. Therapist assisted patient in identifying one small step patient can take to improve mood.  Plan: Return again in 3 weeks.  Diagnosis: Axis I: Bipolar II disorder    Axis II: No diagnosis   I discussed the assessment and treatment plan with the patient. The patient was provided an opportunity to ask questions and all were answered. The patient agreed with the plan and demonstrated an understanding of the instructions.   The patient was advised to call back or seek an in-person evaluation if the symptoms worsen or if the condition fails to improve as anticipated.  I provided 25 minutes of non-face-to-face time during this encounter.    Glori Bickers, LCSW 06/18/2019

## 2019-06-26 ENCOUNTER — Other Ambulatory Visit: Payer: Self-pay | Admitting: Nurse Practitioner

## 2019-06-27 ENCOUNTER — Other Ambulatory Visit: Payer: Self-pay | Admitting: Nurse Practitioner

## 2019-06-28 ENCOUNTER — Ambulatory Visit: Payer: Medicaid Other

## 2019-07-02 ENCOUNTER — Ambulatory Visit: Payer: Medicaid Other

## 2019-07-02 ENCOUNTER — Other Ambulatory Visit: Payer: Self-pay

## 2019-07-03 ENCOUNTER — Other Ambulatory Visit: Payer: Self-pay | Admitting: Nurse Practitioner

## 2019-07-03 ENCOUNTER — Ambulatory Visit (INDEPENDENT_AMBULATORY_CARE_PROVIDER_SITE_OTHER): Payer: Medicaid Other | Admitting: *Deleted

## 2019-07-03 DIAGNOSIS — I10 Essential (primary) hypertension: Secondary | ICD-10-CM

## 2019-07-03 DIAGNOSIS — Z3042 Encounter for surveillance of injectable contraceptive: Secondary | ICD-10-CM

## 2019-07-03 DIAGNOSIS — E785 Hyperlipidemia, unspecified: Secondary | ICD-10-CM | POA: Diagnosis not present

## 2019-07-03 DIAGNOSIS — E032 Hypothyroidism due to medicaments and other exogenous substances: Secondary | ICD-10-CM

## 2019-07-03 NOTE — Progress Notes (Signed)
Pt given Medroxyprogesterone inj Tolerated well 

## 2019-07-04 LAB — CMP14+EGFR
ALT: 20 IU/L (ref 0–32)
AST: 35 IU/L (ref 0–40)
Albumin/Globulin Ratio: 1.9 (ref 1.2–2.2)
Albumin: 4.5 g/dL (ref 3.8–4.8)
Alkaline Phosphatase: 73 IU/L (ref 39–117)
BUN/Creatinine Ratio: 11 (ref 9–23)
BUN: 10 mg/dL (ref 6–24)
Bilirubin Total: 0.7 mg/dL (ref 0.0–1.2)
CO2: 22 mmol/L (ref 20–29)
Calcium: 10 mg/dL (ref 8.7–10.2)
Chloride: 100 mmol/L (ref 96–106)
Creatinine, Ser: 0.94 mg/dL (ref 0.57–1.00)
GFR calc Af Amer: 84 mL/min/{1.73_m2} (ref >59)
GFR calc non Af Amer: 72 mL/min/{1.73_m2} (ref >59)
Globulin, Total: 2.4 g/dL (ref 1.5–4.5)
Glucose: 198 mg/dL — ABNORMAL HIGH (ref 65–99)
Potassium: 3.7 mmol/L (ref 3.5–5.2)
Sodium: 140 mmol/L (ref 134–144)
Total Protein: 6.9 g/dL (ref 6.0–8.5)

## 2019-07-04 LAB — THYROID PANEL WITH TSH
Free Thyroxine Index: 2.1 (ref 1.2–4.9)
T3 Uptake Ratio: 23 % — ABNORMAL LOW (ref 24–39)
T4, Total: 9.3 ug/dL (ref 4.5–12.0)
TSH: 4.32 u[IU]/mL (ref 0.450–4.500)

## 2019-07-04 LAB — LIPID PANEL
Chol/HDL Ratio: 8 ratio — ABNORMAL HIGH (ref 0.0–4.4)
Cholesterol, Total: 193 mg/dL (ref 100–199)
HDL: 24 mg/dL — ABNORMAL LOW (ref >39)
LDL Calculated: 104 mg/dL — ABNORMAL HIGH (ref 0–99)
Triglycerides: 327 mg/dL — ABNORMAL HIGH (ref 0–149)
VLDL Cholesterol Cal: 65 mg/dL — ABNORMAL HIGH (ref 5–40)

## 2019-07-16 ENCOUNTER — Ambulatory Visit (HOSPITAL_COMMUNITY): Payer: Medicaid Other | Admitting: Licensed Clinical Social Worker

## 2019-07-19 ENCOUNTER — Other Ambulatory Visit: Payer: Self-pay

## 2019-07-19 ENCOUNTER — Ambulatory Visit (INDEPENDENT_AMBULATORY_CARE_PROVIDER_SITE_OTHER): Payer: Medicaid Other | Admitting: Licensed Clinical Social Worker

## 2019-07-19 ENCOUNTER — Encounter (HOSPITAL_COMMUNITY): Payer: Self-pay | Admitting: Licensed Clinical Social Worker

## 2019-07-19 DIAGNOSIS — F3181 Bipolar II disorder: Secondary | ICD-10-CM

## 2019-07-19 NOTE — Progress Notes (Signed)
Virtual Visit via Telephone Note  I connected with Dawn Craig on 07/19/19 at  2:00 PM EDT by telephone and verified that I am speaking with the correct person using two identifiers.  Location: Patient: Home Provider: Office   I discussed the limitations, risks, security and privacy concerns of performing an evaluation and management service by telephone and the availability of in person appointments. I also discussed with the patient that there may be a patient responsible charge related to this service. The patient expressed understanding and agreed to proceed.  Participation Level: Active  Behavioral Response: CasualAlertDepressed  Type of Therapy: Individual Therapy  Treatment Goals addressed: Coping  Interventions: CBT and Solution Focused  Summary: Dawn Craig is a 48 y.o. female who presents oriented x5 (person, place, situation, time and object), casually dressed, appropriately groomed, average height, overweight, and cooperative to address mood. Patient has a history of medical treatment including migraines and hypertension. Patient has a history of mental health treatment including outpatient therapy and medication management. Patient denies suicidal and homicidal ideations. Patient denies psychosis including auditory and visual hallucinations. Patient denies substance abuse. Patient is at low risk for lethality at this time.  Physically: Patient has not been active. She spends most of her time in bed or laying on the couch. Spiritually/values: No issues identified.  Relationships: Patient has limited interactions. She admitted that she has family and friends that she could talk to and check in own but she is too focused on herself. Patient agreed to call her friends and family that she has not checked in with.  Emotionally/Mentally/Behavior:  Patient has been down. She continues to not shower or take care of herself. Patient says that she doesn't care about anything but then  admits that she worries about things not getting done around the house. Patient understands that she needs to do something different or things will continue to stay the same.   Patient engaged in session. Patient responded well to interventions. Patient continues to meet criteria for Bipolar II disorder. Patient will continue in outpatient therapy due to being the least restrictive service to meet her needs.   Suicidal/Homicidal: Negativewithout intent/plan  Therapist Response: Therapist reviewed patient's recent thoughts and behaviors. Therapist utilized CBT to address mood. Therapist processed patient's feelings to identify triggers for mood. Therapist assisted patient in ways she can do things different.   Plan: Return again in 3 weeks.  Diagnosis: Axis I: Bipolar II disorder    Axis II: No diagnosis   I discussed the assessment and treatment plan with the patient. The patient was provided an opportunity to ask questions and all were answered. The patient agreed with the plan and demonstrated an understanding of the instructions.   The patient was advised to call back or seek an in-person evaluation if the symptoms worsen or if the condition fails to improve as anticipated.  I provided 45 minutes of non-face-to-face time during this encounter.    Glori Bickers, LCSW 07/19/2019
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
## 2019-07-26 ENCOUNTER — Other Ambulatory Visit: Payer: Self-pay

## 2019-07-26 ENCOUNTER — Ambulatory Visit (INDEPENDENT_AMBULATORY_CARE_PROVIDER_SITE_OTHER): Payer: Medicaid Other | Admitting: Psychiatry

## 2019-07-26 ENCOUNTER — Encounter (HOSPITAL_COMMUNITY): Payer: Self-pay | Admitting: Psychiatry

## 2019-07-26 DIAGNOSIS — F3181 Bipolar II disorder: Secondary | ICD-10-CM

## 2019-07-26 MED ORDER — SERTRALINE HCL 100 MG PO TABS: 150.0000 mg | ORAL_TABLET | Freq: Every day | ORAL | 0 refills | Status: DC

## 2019-07-26 NOTE — Patient Instructions (Signed)
1. Increase sertraline150 mg at night 2.Continuelamotrigine 200 mg daily 3. ContinueLatuda 20mg daily 4. Next appointment: 10/20 at 10 AM

## 2019-08-05 ENCOUNTER — Other Ambulatory Visit: Payer: Self-pay | Admitting: Nurse Practitioner

## 2019-08-05 DIAGNOSIS — N3941 Urge incontinence: Secondary | ICD-10-CM

## 2019-08-07 ENCOUNTER — Other Ambulatory Visit: Payer: Self-pay

## 2019-08-07 ENCOUNTER — Ambulatory Visit (INDEPENDENT_AMBULATORY_CARE_PROVIDER_SITE_OTHER): Payer: Medicaid Other | Admitting: Licensed Clinical Social Worker

## 2019-08-07 DIAGNOSIS — F3181 Bipolar II disorder: Secondary | ICD-10-CM | POA: Diagnosis not present

## 2019-08-08 ENCOUNTER — Encounter (HOSPITAL_COMMUNITY): Payer: Self-pay | Admitting: Licensed Clinical Social Worker

## 2019-08-08 NOTE — Progress Notes (Signed)
Virtual Visit via Telephone Note  I connected with Dawn Craig on 08/08/19 at  4:00 PM EDT by telephone and verified that I am speaking with the correct person using two identifiers.  Location: Patient: Home Provider: Office   I discussed the limitations, risks, security and privacy concerns of performing an evaluation and management service by telephone and the availability of in person appointments. I also discussed with the patient that there may be a patient responsible charge related to this service. The patient expressed understanding and agreed to proceed.  Participation Level: Active  Behavioral Response: CasualAlertDepressed  Type of Therapy: Individual Therapy  Treatment Goals addressed: Coping  Interventions: CBT and Solution Focused  Summary: Dawn Craig is a 48 y.o. female who presents oriented x5 (person, place, situation, time and object), casually dressed, appropriately groomed, average height, overweight, and cooperative to address mood. Patient has a history of medical treatment including migraines and hypertension. Patient has a history of mental health treatment including outpatient therapy and medication management. Patient denies suicidal and homicidal ideations. Patient denies psychosis including auditory and visual hallucinations. Patient denies substance abuse. Patient is at low risk for lethality at this time.  Physically: Patient continues to limit her physical activity. She has spent most of her time in the bed. Patient does very minimal during the day.  Spiritually/values: No issues identified.  Relationships: Patient's son has come to support her and help her around the home. She did reach out to family and friends by phone but stopped after two phone calls. She found out that her aunt's cancer spread and that her friend died. Patient stopped calling after that.  Emotionally/Mentally/Behavior:  Patient has depressed and anxious. Patient feels down because she  found out her friend passed away. Patient feels overwhelmed about making small decisions such as if she should wash a fork or a plate first. She is overwhelmed by making these decisions and this is a pattern that exists throughout her life.   Patient engaged in session. Patient responded well to interventions. Patient continues to meet criteria for Bipolar II disorder. Patient will continue in outpatient therapy due to being the least restrictive service to meet her needs.   Suicidal/Homicidal: Negativewithout intent/plan  Therapist Response: Therapist reviewed patient's recent thoughts and behaviors. Therapist utilized CBT to address mood. Therapist processed patient's feelings to identify triggers for mood. Therapist assisted patient in identifying her thoughts that lead to anxiety.  Plan: Return again in 3 weeks.  Diagnosis: Axis I: Bipolar II disorder    Axis II: No diagnosis   I discussed the assessment and treatment plan with the patient. The patient was provided an opportunity to ask questions and all were answered. The patient agreed with the plan and demonstrated an understanding of the instructions.   The patient was advised to call back or seek an in-person evaluation if the symptoms worsen or if the condition fails to improve as anticipated.  I provided 45 minutes of non-face-to-face time during this encounter.    Dawn Bickers, LCSW 08/08/2019

## 2019-08-30 ENCOUNTER — Other Ambulatory Visit: Payer: Self-pay

## 2019-08-30 ENCOUNTER — Ambulatory Visit (HOSPITAL_COMMUNITY): Payer: Medicaid Other | Admitting: Licensed Clinical Social Worker

## 2019-09-04 ENCOUNTER — Other Ambulatory Visit: Payer: Self-pay | Admitting: Nurse Practitioner

## 2019-09-04 DIAGNOSIS — R000 Tachycardia, unspecified: Secondary | ICD-10-CM

## 2019-09-04 DIAGNOSIS — I10 Essential (primary) hypertension: Secondary | ICD-10-CM
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
## 2019-09-17 ENCOUNTER — Other Ambulatory Visit: Payer: Self-pay | Admitting: Nurse Practitioner

## 2019-09-18 ENCOUNTER — Other Ambulatory Visit: Payer: Self-pay

## 2019-09-18 ENCOUNTER — Ambulatory Visit (INDEPENDENT_AMBULATORY_CARE_PROVIDER_SITE_OTHER): Payer: Medicaid Other | Admitting: Psychiatry

## 2019-09-18 ENCOUNTER — Encounter (HOSPITAL_COMMUNITY): Payer: Self-pay | Admitting: Psychiatry

## 2019-09-18 ENCOUNTER — Telehealth: Payer: Self-pay | Admitting: Nurse Practitioner

## 2019-09-18 DIAGNOSIS — F3181 Bipolar II disorder: Secondary | ICD-10-CM

## 2019-09-18 MED ORDER — SERTRALINE HCL 100 MG PO TABS: 150.0000 mg | ORAL_TABLET | Freq: Every day | ORAL | 1 refills | Status: DC

## 2019-09-18 NOTE — Patient Instructions (Signed)
1. Continue sertraline150 mg at night 2.Continuelamotrigine 200 mg daily  3. ContinueLatuda 20mg daily 4. Next appointment: in January

## 2019-09-18 NOTE — Telephone Encounter (Signed)
Appointment scheduled 09/24/19 at 2:30 pm.

## 2019-09-21 ENCOUNTER — Telehealth: Payer: Self-pay | Admitting: Nurse Practitioner

## 2019-09-24 ENCOUNTER — Telehealth: Payer: Self-pay | Admitting: Nurse Practitioner

## 2019-09-24 ENCOUNTER — Ambulatory Visit: Payer: Medicaid Other

## 2019-09-24 NOTE — Telephone Encounter (Signed)
Appointment scheduled for 10/02/2019 at 2:00 pm

## 2019-09-26 ENCOUNTER — Other Ambulatory Visit: Payer: Self-pay | Admitting: Nurse Practitioner

## 2019-09-26 DIAGNOSIS — I10 Essential (primary) hypertension: Secondary | ICD-10-CM

## 2019-10-02 ENCOUNTER — Ambulatory Visit (INDEPENDENT_AMBULATORY_CARE_PROVIDER_SITE_OTHER): Payer: Medicaid Other | Admitting: *Deleted

## 2019-10-02 ENCOUNTER — Other Ambulatory Visit: Payer: Self-pay

## 2019-10-02 DIAGNOSIS — Z3042 Encounter for surveillance of injectable contraceptive: Secondary | ICD-10-CM | POA: Diagnosis not present

## 2019-10-02 DIAGNOSIS — Z23 Encounter for immunization: Secondary | ICD-10-CM

## 2019-10-08 ENCOUNTER — Ambulatory Visit: Payer: Medicaid Other

## 2019-10-18 ENCOUNTER — Encounter: Payer: Self-pay | Admitting: Nurse Practitioner

## 2019-10-18 ENCOUNTER — Ambulatory Visit (INDEPENDENT_AMBULATORY_CARE_PROVIDER_SITE_OTHER): Payer: Medicaid Other | Admitting: Nurse Practitioner

## 2019-10-18 DIAGNOSIS — R000 Tachycardia, unspecified: Secondary | ICD-10-CM

## 2019-10-18 DIAGNOSIS — F4001 Agoraphobia with panic disorder: Secondary | ICD-10-CM

## 2019-10-18 DIAGNOSIS — I10 Essential (primary) hypertension: Secondary | ICD-10-CM

## 2019-10-18 DIAGNOSIS — E785 Hyperlipidemia, unspecified: Secondary | ICD-10-CM | POA: Diagnosis not present

## 2019-10-18 DIAGNOSIS — F3181 Bipolar II disorder: Secondary | ICD-10-CM

## 2019-10-18 DIAGNOSIS — N3941 Urge incontinence: Secondary | ICD-10-CM

## 2019-10-18 DIAGNOSIS — K219 Gastro-esophageal reflux disease without esophagitis: Secondary | ICD-10-CM | POA: Diagnosis not present

## 2019-10-18 DIAGNOSIS — F3132 Bipolar disorder, current episode depressed, moderate: Secondary | ICD-10-CM | POA: Diagnosis not present

## 2019-10-18 DIAGNOSIS — E032 Hypothyroidism due to medicaments and other exogenous substances: Secondary | ICD-10-CM

## 2019-10-18 MED ORDER — LURASIDONE HCL 20 MG PO TABS: 20.0000 mg | ORAL_TABLET | Freq: Every day | ORAL | 1 refills | Status: DC

## 2019-10-18 MED ORDER — CLONAZEPAM 0.5 MG PO TABS: ORAL_TABLET | ORAL | 2 refills | Status: DC

## 2019-10-18 MED ORDER — PANTOPRAZOLE SODIUM 40 MG PO TBEC: 40.0000 mg | DELAYED_RELEASE_TABLET | Freq: Every day | ORAL | 1 refills | Status: DC

## 2019-10-18 MED ORDER — LOSARTAN POTASSIUM 100 MG PO TABS: 100.0000 mg | ORAL_TABLET | Freq: Every day | ORAL | 1 refills | Status: DC

## 2019-10-18 MED ORDER — LAMOTRIGINE 200 MG PO TABS: 200.0000 mg | ORAL_TABLET | Freq: Every day | ORAL | 1 refills | Status: DC

## 2019-10-18 MED ORDER — SERTRALINE HCL 100 MG PO TABS: 150.0000 mg | ORAL_TABLET | Freq: Every day | ORAL | 1 refills | Status: DC

## 2019-10-18 MED ORDER — LEVOTHYROXINE SODIUM 88 MCG PO TABS: ORAL_TABLET | ORAL | 1 refills | Status: DC

## 2019-10-18 MED ORDER — HYDROCHLOROTHIAZIDE 25 MG PO TABS: 25.0000 mg | ORAL_TABLET | Freq: Every day | ORAL | 1 refills | Status: DC

## 2019-10-18 MED ORDER — METOPROLOL SUCCINATE ER 50 MG PO TB24: ORAL_TABLET | ORAL | 1 refills | Status: DC

## 2019-10-18 MED ORDER — TOLTERODINE TARTRATE ER 4 MG PO CP24: 4.0000 mg | ORAL_CAPSULE | Freq: Every day | ORAL | 1 refills | Status: DC
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
## 2019-10-22 ENCOUNTER — Other Ambulatory Visit: Payer: Self-pay

## 2019-10-22 ENCOUNTER — Encounter (HOSPITAL_COMMUNITY): Payer: Self-pay | Admitting: Licensed Clinical Social Worker

## 2019-10-22 ENCOUNTER — Ambulatory Visit (INDEPENDENT_AMBULATORY_CARE_PROVIDER_SITE_OTHER): Payer: Medicaid Other | Admitting: Licensed Clinical Social Worker

## 2019-10-22 DIAGNOSIS — F3181 Bipolar II disorder: Secondary | ICD-10-CM
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
## 2019-11-21 DIAGNOSIS — I10 Essential (primary) hypertension: Secondary | ICD-10-CM | POA: Diagnosis not present

## 2019-11-21 DIAGNOSIS — R112 Nausea with vomiting, unspecified: Secondary | ICD-10-CM | POA: Diagnosis not present

## 2019-11-21 DIAGNOSIS — R05 Cough: Secondary | ICD-10-CM | POA: Diagnosis not present

## 2019-11-21 DIAGNOSIS — D72829 Elevated white blood cell count, unspecified: Secondary | ICD-10-CM | POA: Diagnosis not present

## 2019-11-21 DIAGNOSIS — E1165 Type 2 diabetes mellitus with hyperglycemia: Secondary | ICD-10-CM | POA: Diagnosis not present

## 2019-11-21 DIAGNOSIS — R0902 Hypoxemia: Secondary | ICD-10-CM | POA: Diagnosis not present

## 2019-11-21 DIAGNOSIS — R0689 Other abnormalities of breathing: Secondary | ICD-10-CM | POA: Diagnosis not present

## 2019-11-21 DIAGNOSIS — R4020 Unspecified coma: Secondary | ICD-10-CM | POA: Diagnosis not present

## 2019-11-21 DIAGNOSIS — Z20828 Contact with and (suspected) exposure to other viral communicable diseases: Secondary | ICD-10-CM | POA: Diagnosis not present

## 2019-11-21 DIAGNOSIS — R7401 Elevation of levels of liver transaminase levels: Secondary | ICD-10-CM | POA: Diagnosis not present

## 2019-11-21 DIAGNOSIS — E860 Dehydration: Secondary | ICD-10-CM | POA: Diagnosis not present

## 2019-11-22 DIAGNOSIS — R7989 Other specified abnormal findings of blood chemistry: Secondary | ICD-10-CM | POA: Diagnosis not present

## 2019-11-22 DIAGNOSIS — R0902 Hypoxemia: Secondary | ICD-10-CM | POA: Diagnosis not present

## 2019-11-23 DIAGNOSIS — R945 Abnormal results of liver function studies: Secondary | ICD-10-CM | POA: Diagnosis not present

## 2019-11-23 DIAGNOSIS — R197 Diarrhea, unspecified: Secondary | ICD-10-CM | POA: Diagnosis not present

## 2019-11-23 DIAGNOSIS — E669 Obesity, unspecified: Secondary | ICD-10-CM | POA: Diagnosis not present

## 2019-12-06 ENCOUNTER — Other Ambulatory Visit: Payer: Self-pay

## 2019-12-06 ENCOUNTER — Ambulatory Visit (INDEPENDENT_AMBULATORY_CARE_PROVIDER_SITE_OTHER): Payer: Medicaid Other | Admitting: Licensed Clinical Social Worker

## 2019-12-06 DIAGNOSIS — F3181 Bipolar II disorder: Secondary | ICD-10-CM
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
## 2019-12-17 ENCOUNTER — Other Ambulatory Visit: Payer: Self-pay | Admitting: Nurse Practitioner

## 2019-12-17 NOTE — Telephone Encounter (Signed)
OV 01/18/20 

## 2019-12-18 ENCOUNTER — Other Ambulatory Visit: Payer: Self-pay

## 2019-12-18 ENCOUNTER — Ambulatory Visit: Payer: Medicaid Other

## 2019-12-19 ENCOUNTER — Ambulatory Visit (HOSPITAL_COMMUNITY): Payer: Medicaid Other | Admitting: Psychiatry

## 2019-12-20 ENCOUNTER — Ambulatory Visit (INDEPENDENT_AMBULATORY_CARE_PROVIDER_SITE_OTHER): Payer: Medicaid Other | Admitting: Licensed Clinical Social Worker

## 2019-12-20 ENCOUNTER — Other Ambulatory Visit: Payer: Self-pay

## 2019-12-20 ENCOUNTER — Ambulatory Visit (INDEPENDENT_AMBULATORY_CARE_PROVIDER_SITE_OTHER): Payer: Medicaid Other | Admitting: *Deleted

## 2019-12-20 DIAGNOSIS — F3181 Bipolar II disorder: Secondary | ICD-10-CM | POA: Diagnosis not present

## 2019-12-20 DIAGNOSIS — Z3042 Encounter for surveillance of injectable contraceptive: Secondary | ICD-10-CM | POA: Diagnosis not present

## 2019-12-20 NOTE — Progress Notes (Signed)
Virtual Visit via Telephone Note  I connected with Dawn Craig on 12/20/19 at 10:00 AM EST by telephone and verified that I am speaking with the correct person using two identifiers.  Location: Patient: Home Provider: Office   I discussed the limitations, risks, security and privacy concerns of performing an evaluation and management service by telephone and the availability of in person appointments. I also discussed with the patient that there may be a patient responsible charge related to this service. The patient expressed understanding and agreed to proceed.  Participation Level: Active  Behavioral Response: CasualAlertDepressed  Type of Therapy: Individual Therapy  Treatment Goals addressed: Coping  Interventions: CBT and Solution Focused  Summary: Dawn Craig is a 49 y.o. female who presents oriented x5 (person, place, situation, time and object), casually dressed, appropriately groomed, average height, overweight, and cooperative to address mood. Patient has a history of medical treatment including migraines and hypertension. Patient has a history of mental health treatment including outpatient therapy and medication management. Patient denies suicidal and homicidal ideations. Patient denies psychosis including auditory and visual hallucinations. Patient denies substance abuse. Patient is at low risk for lethality at this time.  Physically: Patient continues to not take care of herself. Patient has committed to taking small steps such as brushing her teeth regularly and has not followed through. Patient has tried to regulate her sleep by going to bed at 10 pm and getting up by 9am. Patient had a migraine during session. Patient is not active during the day.  Spiritually/values: No issues identified.  Relationships: Patient continues to be concerned for her son and his mental health. He is hearing voices. Patient doesn't think he is using drugs anymore but can't be for certain.   Emotionally/Mentally/Behavior:  Patient continues to feel down and anxious. Patient notes that her mood and anxiety fluctuate. She has difficulty identify what she is feeling. Patient lost her SSI and has the opportunity to appeal but has not. She admitted that she puts things off until the last minute.   Patient was present for session. Patient responded neutral to interventions. Patient continues to meet criteria for Bipolar II disorder. Patient will continue in outpatient therapy due to being the least restrictive service to meet her needs. Patient made no progress on her goals.   Suicidal/Homicidal: Negativewithout intent/plan  Therapist Response: Therapist reviewed patient's recent thoughts and behaviors. Therapist utilized CBT to address mood. Therapist processed patient's feelings to identify triggers for mood. Therapist had patient identify what has gone well since the last session, her physical hygiene and her relationship with her son.   Plan: Return again in 2 weeks.  Diagnosis: Axis I: Bipolar II disorder    Axis II: No diagnosis   I discussed the assessment and treatment plan with the patient. The patient was provided an opportunity to ask questions and all were answered. The patient agreed with the plan and demonstrated an understanding of the instructions.   The patient was advised to call back or seek an in-person evaluation if the symptoms worsen or if the condition fails to improve as anticipated.  I provided 25 minutes of non-face-to-face time during this encounter.    Bynum Bellows, LCSW 12/20/2019

## 2019-12-20 NOTE — Progress Notes (Signed)
Depo provera given and tolerated well. 

## 2019-12-23 ENCOUNTER — Other Ambulatory Visit: Payer: Self-pay | Admitting: Nurse Practitioner

## 2020-01-03 ENCOUNTER — Ambulatory Visit (HOSPITAL_COMMUNITY): Payer: Medicaid Other | Admitting: Licensed Clinical Social Worker

## 2020-01-07 ENCOUNTER — Other Ambulatory Visit: Payer: Self-pay

## 2020-01-07 ENCOUNTER — Ambulatory Visit (INDEPENDENT_AMBULATORY_CARE_PROVIDER_SITE_OTHER): Payer: Medicaid Other | Admitting: Psychiatry

## 2020-01-07 ENCOUNTER — Encounter: Payer: Self-pay | Admitting: Psychiatry

## 2020-01-07 DIAGNOSIS — F410 Panic disorder [episodic paroxysmal anxiety] without agoraphobia: Secondary | ICD-10-CM

## 2020-01-07 DIAGNOSIS — F3181 Bipolar II disorder: Secondary | ICD-10-CM

## 2020-01-07 MED ORDER — SERTRALINE HCL 100 MG PO TABS: ORAL_TABLET | ORAL | 0 refills | Status: DC

## 2020-01-07 MED ORDER — LURASIDONE HCL 20 MG PO TABS: ORAL_TABLET | ORAL | 0 refills | Status: DC

## 2020-01-07 MED ORDER — LAMOTRIGINE 200 MG PO TABS: 200.0000 mg | ORAL_TABLET | Freq: Every day | ORAL | 0 refills | Status: DC

## 2020-01-07 NOTE — Progress Notes (Signed)
[facility] MD OP Progress Note  Virtual Visit via Telephone Note  I connected with Dawn Craig on 01/07/20 at 10:00 AM EST by telephone and verified that I am speaking with the correct person using two identifiers.  I discussed the limitations, risks, security and privacy concerns of performing an evaluation and management service by telephone and the availability of in person appointments. I also discussed with the patient that there may be a patient responsible charge related to this service. The patient expressed understanding and agreed to proceed.    01/07/2020 10:05 AM Dawn Craig  MRN:  [id number]  Chief Complaint: " I still feel depressed."  HPI: Patient reported that she still continues to feel depressed on most of the days.  She still feels sad with low energy levels.  She still endorses anhedonia.  She did find sertraline to be helpful and she was agreeable to try higher dose to see if that helps her mood better.  She is able to take care of her chores and routine for the most part.  She denied any suicidal ideations.  Sleep has been okay.  She was agreeable to continuing the Lamictal and Latuda at same doses.  Visit Diagnosis:    ICD-10-CM   1. Bipolar II disorder ([place])  F31.81   2. Panic disorder  F41.0     Past Psychiatric History: Bipolar 2 d/o  Past Medical History:  Past Medical History:  Diagnosis Date  . Anxiety   . Bipolar 1 disorder ([place])   . GERD (gastroesophageal reflux disease)   . H/O sinus surgery   . Hyperlipidemia   . Hypertension   . Thyroid disease     Past Surgical History:  Procedure Laterality Date  . CESAREAN SECTION      Family Psychiatric History: see below  Family History:  Family History  Problem Relation Age of Onset  . Hypertension Mother   . Cancer Mother        skin,face  . Colon polyps Mother        had to have surgery. Possible in 77s, early 64s   . Hypertension Father   . Hyperlipidemia Father   . Depression Maternal Aunt   .  Bipolar disorder Cousin   . Depression Other   . Suicidality Other   . Colon cancer Neg Hx     Social History:  Social History   Socioeconomic History  . Marital status: Divorced    Spouse name: Not on file  . Number of children: 1  . Years of education: Not on file  . Highest education level: Not on file  Occupational History  . Not on file  Tobacco Use  . Smoking status: Never Smoker  . Smokeless tobacco: Never Used  Substance and Sexual Activity  . Alcohol use: No  . Drug use: No  . Sexual activity: Not on file  Other Topics Concern  . Not on file  Social History Narrative   49 year old female, 50 in May 2020.    Social Determinants of Health   Financial Resource Strain:   . Difficulty of Paying Living Expenses: Not on file  Food Insecurity:   . Worried About Charity fundraiser in the Last Year: Not on file  . Ran Out of Food in the Last Year: Not on file  Transportation Needs:   . Lack of Transportation (Medical): Not on file  . Lack of Transportation (Non-Medical): Not on file  Physical Activity:   . Days of Exercise per  Week: Not on file  . Minutes of Exercise per Session: Not on file  Stress:   . Feeling of Stress : Not on file  Social Connections:   . Frequency of Communication with Friends and Family: Not on file  . Frequency of Social Gatherings with Friends and Family: Not on file  . Attends Religious Services: Not on file  . Active Member of Clubs or Organizations: Not on file  . Attends Banker Meetings: Not on file  . Marital Status: Not on file    Allergies:  Allergies  Allergen Reactions  . Ace Inhibitors Cough  . Famvir [Famciclovir] Hives and Swelling    Throat closes   . Tetanus Toxoids     Swelling at sight    Metabolic Disorder Labs: Lab Results  Component Value Date   HGBA1C 6.0 03/07/2018   No results found for: PROLACTIN Lab Results  Component Value Date   CHOL 193 07/03/2019   TRIG 327 (H) 07/03/2019   HDL  24 (L) 07/03/2019   CHOLHDL 8.0 (H) 07/03/2019   LDLCALC 104 (H) 07/03/2019   LDLCALC 84 06/05/2018   Lab Results  Component Value Date   TSH 4.320 07/03/2019   TSH 2.660 06/05/2018    Therapeutic Level Labs: No results found for: LITHIUM No results found for: VALPROATE No components found for:  CBMZ  Current Medications: Current Outpatient Medications  Medication Sig Dispense Refill  . betamethasone dipropionate (DIPROLENE) 0.05 % cream Apply topically 2 (two) times daily. (Patient taking differently: Apply 1 application topically 2 (two) times daily as needed (ezcema.). ) 30 g 3  . cetirizine (ZYRTEC) 10 MG tablet TAKE 1 TABLET BY MOUTH EVERY DAY 90 tablet 1  . Cholecalciferol (VITAMIN D) 2000 UNITS CAPS Take 2,000 Units by mouth daily.     . clonazePAM (KLONOPIN) 0.5 MG tablet 0.5 mg daily as needed for anxiety. 30 tabs for 60 days 60 tablet 2  . fluticasone (FLONASE) 50 MCG/ACT nasal spray INSTILL 1 SPRAY INTO EACH NOSTRIL 2 TIMES DAILY 48 g 1  . hydrochlorothiazide (HYDRODIURIL) 25 MG tablet Take 1 tablet (25 mg total) by mouth daily. 90 tablet 1  . lamoTRIgine (LAMICTAL) 200 MG tablet Take 1 tablet (200 mg total) by mouth daily. 90 tablet 1  . levothyroxine (SYNTHROID) 88 MCG tablet TAKE 1 TABLET BY MOUTH EVERY DAY 90 tablet 1  . losartan (COZAAR) 100 MG tablet Take 1 tablet (100 mg total) by mouth daily. 90 tablet 1  . lurasidone (LATUDA) 20 MG TABS tablet Take 1 tablet (20 mg total) by mouth daily with breakfast. 90 tablet 1  . medroxyPROGESTERone Acetate 150 MG/ML SUSY Inject 1 mL (150 mg total) into the muscle every 3 (three) months. 1 mL 0  . metoprolol succinate (TOPROL-XL) 50 MG 24 hr tablet Take with or immediately following a meal. 90 tablet 1  . naproxen (NAPROSYN) 500 MG tablet TAKE 1 TABLET (500 MG TOTAL) BY MOUTH 2 (TWO) TIMES DAILY WITH A MEAL. (Patient taking differently: Take 500 mg by mouth 2 (two) times daily as needed (pain). ) 60 tablet 0  . naproxen sodium  (ALEVE) 220 MG tablet Take 220-440 mg by mouth 2 (two) times daily as needed (for pain.).    Marland Kitchen pantoprazole (PROTONIX) 40 MG tablet Take 1 tablet (40 mg total) by mouth daily. 90 tablet 1  . promethazine (PHENERGAN) 25 MG tablet Take 0.5 tablets (12.5 mg total) by mouth every 6 (six) hours as needed for nausea or  vomiting. 30 tablet 0  . sertraline (ZOLOFT) 100 MG tablet Take 1.5 tablets (150 mg total) by mouth daily. 135 tablet 1  . tolterodine (DETROL LA) 4 MG 24 hr capsule Take 1 capsule (4 mg total) by mouth daily. (Needs to be seen before next refill) 90 capsule 1  . triamcinolone cream (KENALOG) 0.1 % Apply 1 application topically 2 (two) times daily. (Patient taking differently: Apply 1 application topically 2 (two) times daily as needed (ezcema). ) 30 g 0   Current Facility-Administered Medications  Medication Dose Route Frequency Provider Last Rate Last Admin  . medroxyPROGESTERone (DEPO-PROVERA) injection 150 mg  150 mg Intramuscular Q90 days Bennie Pierini, FNP   150 mg at 12/20/19 1600     Musculoskeletal: Strength & Muscle Tone: unable to assess due to telemed visit Gait & Station: unable to assess due to telemed visit Patient leans: unable to assess due to telemed visit   Psychiatric Specialty Exam: Review of Systems  There were no vitals taken for this visit.There is no height or weight on file to calculate BMI.  General Appearance: unable to assess due to phone visit  Eye Contact:  unable to assess due to phone visit  Speech:  Normal Rate  Volume:  Normal  Mood:  Depressed  Affect:  Congruent  Thought Process:  Goal Directed and Descriptions of Associations: Intact  Orientation:  Full (Time, Place, and Person)  Thought Content: Logical   Suicidal Thoughts:  No  Homicidal Thoughts:  No  Memory:  Immediate;   Good Recent;   Good  Judgement:  Fair  Insight:  Fair  Psychomotor Activity:  Normal  Concentration:  Concentration: Good and Attention Span: Good   Recall:  Good  Fund of Knowledge: Good  Language: Good  Akathisia:  Negative  Handed:  Right  AIMS (if indicated): not done  Assets:  Communication Skills Desire for Improvement Financial Resources/Insurance Housing  ADL's:  Intact  Cognition: WNL  Sleep:  Fair   Screenings: GAD-7     Office Visit from 10/18/2019 in [facility]  Total GAD-7 Score  11    PHQ2-9     Office Visit from 10/18/2019 in [facility] Office Visit from 10/18/2018 in [facility] Office Visit from 07/04/2018 in [facility] Office Visit from 06/05/2018 in [facility] Office Visit from 05/16/2018 in [facility]  PHQ-2 Total Score  4  4  5  5  5   PHQ-9 Total Score  15  21  20  21  24        Assessment and Plan: 49 y.o. year old female with a history of bipolar II disorder,,hypertension, migraine, hypothyroidism, hyperlipidemia, GERD , who was contacted via phone for f/up. Pt still endorses ongoing depressive symptoms, she was agreeable to increasing the dose of Sertraline to 200 mg for optimal effect.  1. Bipolar II disorder ([facility])  - lurasidone (LATUDA) 20 MG TABS tablet; Take one tablet daily with dinner  Dispense: 90 tablet; Refill: 0 - Increase sertraline (ZOLOFT) 100 MG tablet; Take 2 tablets daily  Dispense: 180 tablet; Refill: 0 - lamoTRIgine (LAMICTAL) 200 MG tablet; Take 1 tablet (200 mg total) by mouth daily.  Dispense: 90 tablet; Refill: 0  2. Panic disorder  - sertraline (ZOLOFT) 100 MG tablet; Take 2 tablets daily  Dispense: 180 tablet; Refill: 0   F/up in 2 months.  , MD 01/07/2020, 10:05 AM

## 2020-01-11 ENCOUNTER — Other Ambulatory Visit: Payer: Self-pay

## 2020-01-11 ENCOUNTER — Ambulatory Visit (INDEPENDENT_AMBULATORY_CARE_PROVIDER_SITE_OTHER): Payer: Medicaid Other | Admitting: Licensed Clinical Social Worker

## 2020-01-11 DIAGNOSIS — F3181 Bipolar II disorder: Secondary | ICD-10-CM | POA: Diagnosis not present

## 2020-01-11 NOTE — Progress Notes (Signed)
Virtual Visit via Telephone Note  I connected with Dawn Craig on 01/11/20 at  9:00 AM EST by telephone and verified that I am speaking with the correct person using two identifiers.  Location: Patient: Home Provider: Office   I discussed the limitations, risks, security and privacy concerns of performing an evaluation and management service by telephone and the availability of in person appointments. I also discussed with the patient that there may be a patient responsible charge related to this service. The patient expressed understanding and agreed to proceed.  Participation Level: Active  Behavioral Response: CasualAlertDepressed  Type of Therapy: Individual Therapy  Treatment Goals addressed: Coping  Interventions: CBT and Solution Focused  Case Summary: Dawn Craig is a 49 y.o. female who presents oriented x5 (person, place, situation, time and object), casually dressed, appropriately groomed, average height, overweight, and cooperative to address mood. Patient has a history of medical treatment including migraines and hypertension. Patient has a history of mental health treatment including outpatient therapy and medication management. Patient denies suicidal and homicidal ideations. Patient denies psychosis including auditory and visual hallucinations. Patient denies substance abuse. Patient is at low risk for lethality at this time.  Physically: Patient admitted that she just exists and is not living.  Spiritually/values: No issues identified.  Relationships: Patient continues to be concerned for her son and his mental health. He got back on heroin, tried to hang himself, and went to rehab. Patient found patient trying to hang himself and got him down. She was shocked by this and it took several days for this to sink it.   Emotionally/Mentally/Behavior:  Patient continues to feel down and anxious. Patient had not thought about a future free from anxiety and depression. She admitted  that she feels like she has "given up" and is just existing. Patient was hesitant but was able to start to envision a future when her depression and anxiety are stable.  Patient's future includes:  living again, getting out more. She will be getting together with family and have fun with them again. Patient will go out to eat, be around people, and enjoy herself. Patient will go to her sister's cabin and see the finished product. She used to go to the cabin and work on but has not seen it finished. Patient will have close relationships with her family again. She will be more active. She will get out of the house and she will walk. Patient noted she used to walk 5 miles a day, lost weight, and felt good about herself in the past. Patient will take care of herself, her hygiene and how she dresses. Patient will clean up more.  Patient identified that going for a short walk once a week would be the first step to make this a reality. Patient agreed to go for a 15 minute walk once a week to start.   Patient was present for session. Patient responded neutral to interventions. Patient continues to meet criteria for Bipolar II disorder. Patient will continue in outpatient therapy due to being the least restrictive service to meet her needs. Patient made minimal progress on her goals.   Suicidal/Homicidal: Negativewithout intent/plan  Therapist Response: Therapist reviewed patient's recent thoughts and behaviors. Therapist utilized CBT to address mood. Therapist processed patient's feelings to identify triggers for mood. Therapist had patient identify a future where her depression and anxiety are stable as well as steps she can take in the present to make the future a reality. Therapist updated patient treatment plan.  Plan: Return again in 2 weeks.  Diagnosis: Axis I: Bipolar II disorder    Axis II: No diagnosis   I discussed the assessment and treatment plan with the patient. The patient was provided an  opportunity to ask questions and all were answered. The patient agreed with the plan and demonstrated an understanding of the instructions.   The patient was advised to call back or seek an in-person evaluation if the symptoms worsen or if the condition fails to improve as anticipated.  I provided 45 minutes of non-face-to-face time during this encounter.    Dawn Bickers, LCSW 01/11/2020

## 2020-01-18 ENCOUNTER — Ambulatory Visit (INDEPENDENT_AMBULATORY_CARE_PROVIDER_SITE_OTHER): Payer: Medicaid Other | Admitting: Nurse Practitioner

## 2020-01-18 ENCOUNTER — Other Ambulatory Visit: Payer: Self-pay

## 2020-01-18 ENCOUNTER — Encounter: Payer: Self-pay | Admitting: Nurse Practitioner

## 2020-01-18 DIAGNOSIS — F5081 Binge eating disorder: Secondary | ICD-10-CM

## 2020-01-18 DIAGNOSIS — J0101 Acute recurrent maxillary sinusitis: Secondary | ICD-10-CM | POA: Diagnosis not present

## 2020-01-18 DIAGNOSIS — I10 Essential (primary) hypertension: Secondary | ICD-10-CM

## 2020-01-18 DIAGNOSIS — F3181 Bipolar II disorder: Secondary | ICD-10-CM | POA: Diagnosis not present

## 2020-01-18 DIAGNOSIS — K219 Gastro-esophageal reflux disease without esophagitis: Secondary | ICD-10-CM

## 2020-01-18 DIAGNOSIS — F410 Panic disorder [episodic paroxysmal anxiety] without agoraphobia: Secondary | ICD-10-CM | POA: Diagnosis not present

## 2020-01-18 DIAGNOSIS — N3941 Urge incontinence: Secondary | ICD-10-CM | POA: Diagnosis not present

## 2020-01-18 DIAGNOSIS — F4001 Agoraphobia with panic disorder: Secondary | ICD-10-CM | POA: Diagnosis not present

## 2020-01-18 DIAGNOSIS — R000 Tachycardia, unspecified: Secondary | ICD-10-CM

## 2020-01-18 DIAGNOSIS — E032 Hypothyroidism due to medicaments and other exogenous substances: Secondary | ICD-10-CM | POA: Diagnosis not present

## 2020-01-18 DIAGNOSIS — E785 Hyperlipidemia, unspecified: Secondary | ICD-10-CM

## 2020-01-18 MED ORDER — HYDROCHLOROTHIAZIDE 25 MG PO TABS: 25.0000 mg | ORAL_TABLET | Freq: Every day | ORAL | 1 refills | Status: DC

## 2020-01-18 MED ORDER — LEVOTHYROXINE SODIUM 88 MCG PO TABS: ORAL_TABLET | ORAL | 1 refills | Status: DC

## 2020-01-18 MED ORDER — METOPROLOL SUCCINATE ER 50 MG PO TB24: ORAL_TABLET | ORAL | 1 refills | Status: DC

## 2020-01-18 MED ORDER — LURASIDONE HCL 20 MG PO TABS: ORAL_TABLET | ORAL | 1 refills | Status: DC

## 2020-01-18 MED ORDER — LOSARTAN POTASSIUM 100 MG PO TABS: 100.0000 mg | ORAL_TABLET | Freq: Every day | ORAL | 1 refills | Status: DC

## 2020-01-18 MED ORDER — PANTOPRAZOLE SODIUM 40 MG PO TBEC: 40.0000 mg | DELAYED_RELEASE_TABLET | Freq: Every day | ORAL | 1 refills | Status: DC

## 2020-01-18 MED ORDER — AMOXICILLIN-POT CLAVULANATE 875-125 MG PO TABS: 1.0000 | ORAL_TABLET | Freq: Two times a day (BID) | ORAL | 0 refills | Status: DC

## 2020-01-18 MED ORDER — LAMOTRIGINE 200 MG PO TABS: 200.0000 mg | ORAL_TABLET | Freq: Every day | ORAL | 1 refills | Status: DC

## 2020-01-18 MED ORDER — TOLTERODINE TARTRATE ER 4 MG PO CP24: 4.0000 mg | ORAL_CAPSULE | Freq: Every day | ORAL | 1 refills | Status: DC

## 2020-01-18 MED ORDER — SERTRALINE HCL 100 MG PO TABS: ORAL_TABLET | ORAL | 1 refills | Status: DC

## 2020-01-18 NOTE — Progress Notes (Signed)
Virtual Visit via telephone Note Due to COVID-19 pandemic this visit was conducted virtually. This visit type was conducted due to national recommendations for restrictions regarding the COVID-19 Pandemic (e.g. social distancing, sheltering in place) in an effort to limit this patient's exposure and mitigate transmission in our community. All issues noted in this document were discussed and addressed.  A physical exam was not performed with this format.  I connected with Dawn Craig on 01/18/20 at 2:10 by telephone and verified that I am speaking with the correct person using two identifiers. Dawn Craig is currently located at home and no one is currently with  her during visit. The provider, Mary-Margaret Daphine Deutscher, FNP is located in their office at time of visit.  I discussed the limitations, risks, security and privacy concerns of performing an evaluation and management service by telephone and the availability of in person appointments. I also discussed with the patient that there may be a patient responsible charge related to this service. The patient expressed understanding and agreed to proceed.   History and Present Illness:   Chief Complaint: Medical Management of Chronic Issues    HPI:  1. Essential hypertension, benign No c/o chest pain, sob or headache. Does not check blood pressure at home. BP Readings from Last 3 Encounters:  12/01/18 125/75  10/18/18 131/85  09/06/18 (!) 153/90     2. Gastroesophageal reflux disease, unspecified whether esophagitis present No recent symptoms. She is not on daily medication.  3. 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  4. Hyperlipidemia with target LDL less than 100 Does not watch diet and does very little exercise. Lab Results  Component Value Date   CHOL 193 07/03/2019   HDL 24 (L) 07/03/2019   LDLCALC 104 (H) 07/03/2019   TRIG 327 (H) 07/03/2019   CHOLHDL 8.0 (H)  07/03/2019     5. Binge eating disorder Still has episodes of binge eating  6. Agoraphobia with panic attacks Has had frequent attacks, but says that she does not take klonopin everyday because it does not help. She says she just lays down when she has episodes.  7. Bipolar II disorder (HCC) Is on latuda and is doing well. She is under a lot of stress because her son tried to hang hisself and she had to cut him down. She called 911 and they took him to the hospital. He got some rehab for about 1 1/2 week. Not long enough. They want him to start on saboxin but have not found anyone to give to him.  8. Urge incontinence of urine Is on detrol LA and that helps some.  9. Severe obesity (BMI >= 40) (HCC) No recent weight changes Wt Readings from Last 3 Encounters:  12/01/18 260 lb 12.8 oz (118.3 kg)  10/18/18 265 lb 12.8 oz (120.6 kg)  09/06/18 275 lb (124.7 kg)   BMI Readings from Last 3 Encounters:  12/01/18 47.70 kg/m  10/18/18 50.22 kg/m  09/06/18 51.96 kg/m       Outpatient Encounter Medications as of 01/18/2020  Medication Sig  . betamethasone dipropionate (DIPROLENE) 0.05 % cream Apply topically 2 (two) times daily. (Patient taking differently: Apply 1 application topically 2 (two) times daily as needed (ezcema.). )  . cetirizine (ZYRTEC) 10 MG tablet TAKE 1 TABLET BY MOUTH EVERY DAY  . Cholecalciferol (VITAMIN D) 2000 UNITS CAPS Take 2,000 Units by mouth daily.   . clonazePAM (KLONOPIN) 0.5 MG tablet 0.5 mg daily as  needed for anxiety. 30 tabs for 60 days  . fluticasone (FLONASE) 50 MCG/ACT nasal spray INSTILL 1 SPRAY INTO EACH NOSTRIL 2 TIMES DAILY  . hydrochlorothiazide (HYDRODIURIL) 25 MG tablet Take 1 tablet (25 mg total) by mouth daily.  Marland Kitchen lamoTRIgine (LAMICTAL) 200 MG tablet Take 1 tablet (200 mg total) by mouth daily.  Marland Kitchen levothyroxine (SYNTHROID) 88 MCG tablet TAKE 1 TABLET BY MOUTH EVERY DAY  . losartan (COZAAR) 100 MG tablet Take 1 tablet (100 mg total) by  mouth daily.  Marland Kitchen lurasidone (LATUDA) 20 MG TABS tablet Take one tablet daily with dinner  . medroxyPROGESTERone Acetate 150 MG/ML SUSY Inject 1 mL (150 mg total) into the muscle every 3 (three) months.  . metoprolol succinate (TOPROL-XL) 50 MG 24 hr tablet Take with or immediately following a meal.  . naproxen (NAPROSYN) 500 MG tablet TAKE 1 TABLET (500 MG TOTAL) BY MOUTH 2 (TWO) TIMES DAILY WITH A MEAL. (Patient taking differently: Take 500 mg by mouth 2 (two) times daily as needed (pain). )  . naproxen sodium (ALEVE) 220 MG tablet Take 220-440 mg by mouth 2 (two) times daily as needed (for pain.).  Marland Kitchen pantoprazole (PROTONIX) 40 MG tablet Take 1 tablet (40 mg total) by mouth daily.  . promethazine (PHENERGAN) 25 MG tablet Take 0.5 tablets (12.5 mg total) by mouth every 6 (six) hours as needed for nausea or vomiting.  . sertraline (ZOLOFT) 100 MG tablet Take 2 tablets daily  . tolterodine (DETROL LA) 4 MG 24 hr capsule Take 1 capsule (4 mg total) by mouth daily. (Needs to be seen before next refill)  . triamcinolone cream (KENALOG) 0.1 % Apply 1 application topically 2 (two) times daily. (Patient taking differently: Apply 1 application topically 2 (two) times daily as needed (ezcema). )    Past Surgical History:  Procedure Laterality Date  . CESAREAN SECTION      Family History  Problem Relation Age of Onset  . Hypertension Mother   . Cancer Mother        skin,face  . Colon polyps Mother        had to have surgery. Possible in 61s, early 66s   . Hypertension Father   . Hyperlipidemia Father   . Depression Maternal Aunt   . Bipolar disorder Cousin   . Depression Other   . Suicidality Other   . Colon cancer Neg Hx     New complaints: Facial pressure, teeth hurt to chew headahce for 7 days.  Social history: Lives by herself and her son lives with her right now.  Controlled substance contract: n/a    Review of Systems  Constitutional: Negative for diaphoresis and weight loss.    HENT: Positive for congestion and sinus pain. Negative for sore throat.   Eyes: Negative for blurred vision, double vision and pain.  Respiratory: Negative for shortness of breath.   Cardiovascular: Negative for chest pain, palpitations, orthopnea and leg swelling.  Gastrointestinal: Negative for abdominal pain.  Skin: Negative for rash.  Neurological: Negative for dizziness, sensory change, loss of consciousness, weakness and headaches.  Endo/Heme/Allergies: Negative for polydipsia. Does not bruise/bleed easily.  Psychiatric/Behavioral: Negative for memory loss. The patient does not have insomnia.   All other systems reviewed and are negative.    Observations/Objective: Alert and oriented- answers all questions appropriately No distress Actually sounds good today   Assessment and Plan: Dawn Craig comes in today with chief complaint of Medical Management of Chronic Issues   Diagnosis and orders addressed:  1. Essential hypertension, benign Low sodium diet - hydrochlorothiazide (HYDRODIURIL) 25 MG tablet; Take 1 tablet (25 mg total) by mouth daily.  Dispense: 90 tablet; Refill: 1 - losartan (COZAAR) 100 MG tablet; Take 1 tablet (100 mg total) by mouth daily.  Dispense: 90 tablet; Refill: 1  2. Gastroesophageal reflux disease, unspecified whether esophagitis present Avoid spicy foods Do not eat 2 hours prior to bedtime  3. 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 Hyperlipidemia with target LDL less than 100 Low fat diet  5. Binge eating disorder  6. Agoraphobia with panic attacks  7. Bipolar II disorder (HCC) Stress management - lamoTRIgine (LAMICTAL) 200 MG tablet; Take 1 tablet (200 mg total) by mouth daily.  Dispense: 90 tablet; Refill: 1 - lurasidone (LATUDA) 20 MG TABS tablet; Take one tablet daily with dinner  Dispense: 90 tablet;  Refill: 1 - sertraline (ZOLOFT) 100 MG tablet; Take 2 tablets daily  Dispense: 180 tablet; Refill: 1  8. Urge incontinence of urine - tolterodine (DETROL LA) 4 MG 24 hr capsule; Take 1 capsule (4 mg total) by mouth daily. (Needs to be seen before next refill)  Dispense: 90 capsule; Refill: 1  9. Severe obesity (BMI >= 40) (HCC) Discussed diet and exercise for person with BMI >25 Will recheck weight in 3-6 months   10. Acute recurrent maxillary sinusitis 1. Take meds as prescribed 2. Use a cool mist humidifier especially during the winter months and when heat has been humid. 3. Use saline nose sprays frequently 4. Saline irrigations of the nose can be very helpful if done frequently.  * 4X daily for 1 week*  * Use of a nettie pot can be helpful with this. Follow directions with this* 5. Drink plenty of fluids 6. Keep thermostat turn down low 7.For any cough or congestion  Use plain Mucinex- regular strength or max strength is fine   * Children- consult with Pharmacist for dosing 8. For fever or aces or pains- take tylenol or ibuprofen appropriate for age and weight.  * for fevers greater than 101 orally you may alternate ibuprofen and tylenol every  3 hours.     - amoxicillin-clavulanate (AUGMENTIN) 875-125 MG tablet; Take 1 tablet by mouth 2 (two) times daily.  Dispense: 14 tablet; Refill: 0  11. Panic disorder Stress management - sertraline (ZOLOFT) 100 MG tablet; Take 2 tablets daily  Dispense: 180 tablet; Refill: 1  12. Gastroesophageal reflux disease Avoid spicy foods Do not eat 2 hours prior to bedtime - pantoprazole (PROTONIX) 40 MG tablet; Take 1 tablet (40 mg total) by mouth daily.  Dispense: 90 tablet; Refill: 1  13. Sinus tachycardia Avoid caffeine - metoprolol succinate (TOPROL-XL) 50 MG 24 hr tablet; Take with or immediately following a meal.  Dispense: 90 tablet; Refill: 1   Labs pending Health Maintenance reviewed Diet and exercise encouraged  Follow Up  Instructions: 3 months    I discussed the assessment and treatment plan with the patient. The patient was provided an opportunity to ask questions and all were answered. The patient agreed with the plan and demonstrated an understanding of the instructions.   The patient was advised to call back or seek an in-person evaluation if the symptoms worsen or if the condition fails to improve as anticipated.  The above assessment and management plan was discussed with the patient. The patient verbalized understanding of and has  agreed to the management plan. Patient is aware to call the clinic if symptoms persist or worsen. Patient is aware when to return to the clinic for a follow-up visit. Patient educated on when it is appropriate to go to the emergency department.   Time call ended:  2:30  I provided 20 minutes of non-face-to-face time during this encounter.    Mary-Margaret Daphine Deutscher, FNP

## 2020-02-19 ENCOUNTER — Telehealth: Payer: Self-pay | Admitting: Nurse Practitioner

## 2020-02-19 NOTE — Telephone Encounter (Signed)
  Medication Request  02/19/2020  What is the name of the medication? clonazePAM (KLONOPIN) 0.5 MG tablet /pt said mmm offered it in feb but she wanted to try to stop taking it . She said she wants to start it back. Also, she has finished her meds for sinus infection and she is not any better. She wants something else called in  Have you contacted your pharmacy to request a refill? no  Which pharmacy would you like this sent to? [facility]   Patient notified that their request is being sent to the clinical staff for review and that they should receive a call once it is complete. If they do not receive a call within 24 hours they can check with their pharmacy or our office.

## 2020-03-06 ENCOUNTER — Ambulatory Visit (HOSPITAL_COMMUNITY): Payer: Medicaid Other | Admitting: Psychiatry
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
## 2020-03-11 ENCOUNTER — Ambulatory Visit (INDEPENDENT_AMBULATORY_CARE_PROVIDER_SITE_OTHER): Payer: Medicaid Other | Admitting: Psychiatry

## 2020-03-11 ENCOUNTER — Other Ambulatory Visit: Payer: Self-pay

## 2020-03-11 ENCOUNTER — Other Ambulatory Visit: Payer: Self-pay | Admitting: Nurse Practitioner

## 2020-03-11 ENCOUNTER — Encounter (HOSPITAL_COMMUNITY): Payer: Self-pay | Admitting: Psychiatry

## 2020-03-11 DIAGNOSIS — F3181 Bipolar II disorder: Secondary | ICD-10-CM

## 2020-03-13 ENCOUNTER — Other Ambulatory Visit: Payer: Self-pay

## 2020-03-13 ENCOUNTER — Ambulatory Visit (INDEPENDENT_AMBULATORY_CARE_PROVIDER_SITE_OTHER): Payer: Medicaid Other | Admitting: Family Medicine

## 2020-03-13 DIAGNOSIS — Z3042 Encounter for surveillance of injectable contraceptive: Secondary | ICD-10-CM

## 2020-03-13 NOTE — Progress Notes (Signed)
Depo provera given and tolerated well. 
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
## 2020-04-22 ENCOUNTER — Other Ambulatory Visit: Payer: Self-pay

## 2020-04-22 ENCOUNTER — Telehealth (INDEPENDENT_AMBULATORY_CARE_PROVIDER_SITE_OTHER): Payer: Medicaid Other | Admitting: Psychiatry

## 2020-04-22 ENCOUNTER — Encounter (HOSPITAL_COMMUNITY): Payer: Self-pay | Admitting: Psychiatry

## 2020-04-22 DIAGNOSIS — F3181 Bipolar II disorder: Secondary | ICD-10-CM

## 2020-04-22 MED ORDER — LURASIDONE HCL 60 MG PO TABS: 60.0000 mg | ORAL_TABLET | Freq: Every day | ORAL | 0 refills | Status: DC

## 2020-04-22 NOTE — Patient Instructions (Addendum)
1.Continuesertraline200 mg at night 2.Continuelamotrigine 200 mg daily 3. IncreaseLatuda 60mg daily 4. Next appointment: 7/26 at 1:40

## 2020-04-29 ENCOUNTER — Telehealth: Payer: Self-pay | Admitting: Nurse Practitioner

## 2020-04-29 DIAGNOSIS — F3132 Bipolar disorder, current episode depressed, moderate: Secondary | ICD-10-CM

## 2020-04-29 MED ORDER — CLONAZEPAM 0.5 MG PO TABS: ORAL_TABLET | ORAL | 2 refills | Status: DC

## 2020-04-29 NOTE — Telephone Encounter (Signed)
Refilled klonopin- call if need me.

## 2020-04-29 NOTE — Telephone Encounter (Signed)
Patient aware and verbalized understanding. °

## 2020-06-05 ENCOUNTER — Other Ambulatory Visit: Payer: Self-pay | Admitting: Nurse Practitioner

## 2020-06-06 ENCOUNTER — Ambulatory Visit: Payer: Medicaid Other

## 2020-06-11 ENCOUNTER — Telehealth: Payer: Self-pay | Admitting: Nurse Practitioner

## 2020-06-11 ENCOUNTER — Ambulatory Visit: Payer: Medicaid Other

## 2020-06-12 ENCOUNTER — Ambulatory Visit (INDEPENDENT_AMBULATORY_CARE_PROVIDER_SITE_OTHER): Payer: Medicaid Other | Admitting: *Deleted

## 2020-06-12 ENCOUNTER — Other Ambulatory Visit: Payer: Self-pay

## 2020-06-12 DIAGNOSIS — Z3042 Encounter for surveillance of injectable contraceptive: Secondary | ICD-10-CM

## 2020-06-12 NOTE — Progress Notes (Signed)
Depo-Provera injection given and tolerate well.  Next appt made.
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
## 2020-06-23 ENCOUNTER — Other Ambulatory Visit: Payer: Self-pay

## 2020-06-23 ENCOUNTER — Encounter (HOSPITAL_COMMUNITY): Payer: Self-pay | Admitting: Psychiatry

## 2020-06-23 ENCOUNTER — Telehealth (INDEPENDENT_AMBULATORY_CARE_PROVIDER_SITE_OTHER): Payer: Medicaid Other | Admitting: Psychiatry

## 2020-06-23 DIAGNOSIS — F3181 Bipolar II disorder: Secondary | ICD-10-CM

## 2020-06-23 MED ORDER — LURASIDONE HCL 60 MG PO TABS: 60.0000 mg | ORAL_TABLET | Freq: Every day | ORAL | 0 refills | Status: DC

## 2020-07-09 ENCOUNTER — Encounter: Payer: Medicaid Other | Admitting: Nurse Practitioner
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
## 2020-07-28 ENCOUNTER — Encounter (HOSPITAL_COMMUNITY): Payer: Self-pay | Admitting: Psychiatry

## 2020-07-28 ENCOUNTER — Telehealth (INDEPENDENT_AMBULATORY_CARE_PROVIDER_SITE_OTHER): Payer: Medicaid Other | Admitting: Psychiatry

## 2020-07-28 ENCOUNTER — Other Ambulatory Visit: Payer: Self-pay

## 2020-07-28 DIAGNOSIS — F3181 Bipolar II disorder: Secondary | ICD-10-CM | POA: Diagnosis not present

## 2020-07-30 ENCOUNTER — Other Ambulatory Visit: Payer: Self-pay | Admitting: Nurse Practitioner

## 2020-07-30 DIAGNOSIS — K219 Gastro-esophageal reflux disease without esophagitis: Secondary | ICD-10-CM

## 2020-08-01 ENCOUNTER — Other Ambulatory Visit: Payer: Self-pay

## 2020-08-01 ENCOUNTER — Encounter: Payer: Self-pay | Admitting: Nurse Practitioner

## 2020-08-01 ENCOUNTER — Ambulatory Visit (INDEPENDENT_AMBULATORY_CARE_PROVIDER_SITE_OTHER): Payer: Medicaid Other | Admitting: Nurse Practitioner

## 2020-08-01 DIAGNOSIS — J00 Acute nasopharyngitis [common cold]: Secondary | ICD-10-CM

## 2020-08-01 MED ORDER — AMOXICILLIN-POT CLAVULANATE 875-125 MG PO TABS
1.0000 | ORAL_TABLET | Freq: Two times a day (BID) | ORAL | 0 refills | Status: DC
Start: 2020-08-01 — End: 2021-06-05

## 2020-08-01 NOTE — Progress Notes (Signed)
Virtual Visit via telephone Note Due to COVID-19 pandemic this visit was conducted virtually. This visit type was conducted due to national recommendations for restrictions regarding the COVID-19 Pandemic (e.g. social distancing, sheltering in place) in an effort to limit this patient's exposure and mitigate transmission in our community. All issues noted in this document were discussed and addressed.  A physical exam was not performed with this format.  I connected with Dawn Craig on 08/01/20 at 2:00 by telephone and verified that I am speaking with the correct person using two identifiers. Dawn Craig is currently located at home and no one is currently with her during visit. The provider, Mary-Margaret Daphine Deutscher, FNP is located in their office at time of visit.  I discussed the limitations, risks, security and privacy concerns of performing an evaluation and management service by telephone and the availability of in person appointments. I also discussed with the patient that there may be a patient responsible charge related to this service. The patient expressed understanding and agreed to proceed.   History and Present Illness:   Chief Complaint: Covid Exposure   HPI Patient called in c/o body aches, chills, fever and headache. Cough productive with yellowish phlegm. Has been gong on for 2 weeks. She did not get covid vaccine. She hardly ever leaves her house she doe snot know how she could have been exposed.     Review of Systems  Constitutional: Negative for diaphoresis and weight loss.  Eyes: Negative for blurred vision, double vision and pain.  Respiratory: Negative for shortness of breath.   Cardiovascular: Negative for chest pain, palpitations, orthopnea and leg swelling.  Gastrointestinal: Negative for abdominal pain.  Skin: Negative for rash.  Neurological: Negative for dizziness, sensory change, loss of consciousness, weakness and headaches.  Endo/Heme/Allergies: Negative  for polydipsia. Does not bruise/bleed easily.  Psychiatric/Behavioral: Negative for memory loss. The patient does not have insomnia.   All other systems reviewed and are negative.    Observations/Objective: Alert and oriented- answers all questions appropriately No distress Cough during visit  Assessment and Plan: Gabreille Dardis in today with chief complaint of Covid Exposure   1. Acute nasopharyngitis 1. Take meds as prescribed 2. Use a cool mist humidifier especially during the winter months and when heat has been humid. 3. Use saline nose sprays frequently 4. Saline irrigations of the nose can be very helpful if done frequently.  * 4X daily for 1 week*  * Use of a nettie pot can be helpful with this. Follow directions with this* 5. Drink plenty of fluids 6. Keep thermostat turn down low 7.For any cough or congestion  Use plain Mucinex- regular strength or max strength is fine   * Children- consult with Pharmacist for dosing 8. For fever or aces or pains- take tylenol or ibuprofen appropriate for age and weight.  * for fevers greater than 101 orally you may alternate ibuprofen and tylenol every  3 hours.   Meds ordered this encounter  Medications  . amoxicillin-clavulanate (AUGMENTIN) 875-125 MG tablet    Sig: Take 1 tablet by mouth 2 (two) times daily.    Dispense:  14 tablet    Refill:  0    Order Specific Question:   Supervising Provider    Answer:   Arville Care A [[id number]]        Follow Up Instructions: prn    I discussed the assessment and treatment plan with the patient. The patient was provided an opportunity to ask questions and all  were answered. The patient agreed with the plan and demonstrated an understanding of the instructions.   The patient was advised to call back or seek an in-person evaluation if the symptoms worsen or if the condition fails to improve as anticipated.  The above assessment and management plan was discussed with the patient.  The patient verbalized understanding of and has agreed to the management plan. Patient is aware to call the clinic if symptoms persist or worsen. Patient is aware when to return to the clinic for a follow-up visit. Patient educated on when it is appropriate to go to the emergency department.   Time call ended:  2:15  I provided 15 minutes of non-face-to-face time during this encounter.    Mary-Margaret Daphine Deutscher, FNP

## 2020-08-26 ENCOUNTER — Other Ambulatory Visit: Payer: Self-pay | Admitting: Nurse Practitioner

## 2020-09-04 ENCOUNTER — Ambulatory Visit: Payer: Medicaid Other

## 2020-09-05 ENCOUNTER — Ambulatory Visit (INDEPENDENT_AMBULATORY_CARE_PROVIDER_SITE_OTHER): Payer: Medicaid Other

## 2020-09-05 DIAGNOSIS — Z3042 Encounter for surveillance of injectable contraceptive: Secondary | ICD-10-CM | POA: Diagnosis not present
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
## 2020-09-22 ENCOUNTER — Encounter (HOSPITAL_COMMUNITY): Payer: Self-pay | Admitting: Psychiatry

## 2020-09-22 ENCOUNTER — Other Ambulatory Visit: Payer: Self-pay

## 2020-09-22 ENCOUNTER — Telehealth (INDEPENDENT_AMBULATORY_CARE_PROVIDER_SITE_OTHER): Payer: Medicaid Other | Admitting: Psychiatry

## 2020-09-22 DIAGNOSIS — F3181 Bipolar II disorder: Secondary | ICD-10-CM

## 2020-09-22 MED ORDER — LURASIDONE HCL 60 MG PO TABS: 60.0000 mg | ORAL_TABLET | Freq: Every day | ORAL | 0 refills | Status: DC

## 2020-11-03 ENCOUNTER — Telehealth: Payer: Self-pay

## 2020-11-03 NOTE — Telephone Encounter (Signed)
received fax that a prior auth was needed for latuda 60mg .

## 2020-11-03 NOTE — Telephone Encounter (Signed)
faxed and confirmed the prior auth approveal notice

## 2020-11-03 NOTE — Telephone Encounter (Signed)
went online and submitted the prior auth for the latuda 60mg   prior auth was approved from 11-03-20 to  11-03-21

## 2020-11-05 ENCOUNTER — Telehealth: Payer: Self-pay

## 2020-11-05 NOTE — Telephone Encounter (Signed)
received notice from covermymeds that latuda 60mg  as approved from 11-03-20 to [id number]  pa case number [id number]

## 2020-11-05 NOTE — Telephone Encounter (Signed)
axed and confirmed approval notice sent to pharmacy

## 2020-11-11 ENCOUNTER — Telehealth: Payer: Self-pay

## 2020-11-11 NOTE — Telephone Encounter (Signed)
  Prescription Request  11/11/2020  What is the name of the medication or equipment? metoprolol succinate (TOPROL-XL) 50 MG 24 hr tablet tolterodine (DETROL LA) 4 MG 24 hr capsule    Have you contacted your pharmacy to request a refill? (if applicable) Yes  Which pharmacy would you like this sent to? [facility]    Patient notified that their request is being sent to the clinical staff for review and that they should receive a response within 2 business days.

## 2020-11-11 NOTE — Telephone Encounter (Signed)
Pt aware she hasn't had labs since 06/2019 so we can't refill medications till her visit which I scheduled for her 11/20/20 at 3:00

## 2020-11-17 ENCOUNTER — Other Ambulatory Visit: Payer: Self-pay | Admitting: Nurse Practitioner

## 2020-11-17 DIAGNOSIS — F3181 Bipolar II disorder: Secondary | ICD-10-CM

## 2020-11-20 ENCOUNTER — Ambulatory Visit: Payer: Medicaid Other | Admitting: Nurse Practitioner

## 2020-11-23 ENCOUNTER — Other Ambulatory Visit: Payer: Self-pay | Admitting: Nurse Practitioner

## 2020-11-26 ENCOUNTER — Other Ambulatory Visit: Payer: Self-pay | Admitting: Nurse Practitioner
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
## 2020-12-15 ENCOUNTER — Encounter: Payer: Self-pay | Admitting: Psychiatry

## 2020-12-15 ENCOUNTER — Telehealth (HOSPITAL_COMMUNITY): Payer: Medicaid Other | Admitting: Psychiatry

## 2020-12-15 ENCOUNTER — Telehealth (INDEPENDENT_AMBULATORY_CARE_PROVIDER_SITE_OTHER): Payer: Medicaid Other | Admitting: Psychiatry

## 2020-12-15 ENCOUNTER — Other Ambulatory Visit: Payer: Self-pay

## 2020-12-15 DIAGNOSIS — F3181 Bipolar II disorder: Secondary | ICD-10-CM | POA: Diagnosis not present

## 2020-12-15 MED ORDER — LURASIDONE HCL 60 MG PO TABS: 60.0000 mg | ORAL_TABLET | Freq: Every day | ORAL | 0 refills | Status: DC

## 2020-12-18 ENCOUNTER — Ambulatory Visit: Payer: Medicaid Other | Admitting: Nurse Practitioner

## 2020-12-18 NOTE — Progress Notes (Incomplete)
Subjective:    Patient ID: Dawn Craig, female    DOB: 1971-06-22, 50 y.o.   MRN: [id number]   Chief Complaint: medical management of chronic issues     HPI:  1. Essential hypertension, benign No c/o chest pain, sob or headache. Does not check blood pressure at home. BP Readings from Last 3 Encounters:  12/01/18 125/75  10/18/18 131/85  09/06/18 (!) 153/90     2. 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 Lab Results  Component Value Date   TSH 4.320 07/03/2019     3. Hyperlipidemia with target LDL less than 100 Does not watch diet and does no exercise. Lab Results  Component Value Date   CHOL 193 07/03/2019   HDL 24 (L) 07/03/2019   LDLCALC 104 (H) 07/03/2019   TRIG 327 (H) 07/03/2019   CHOLHDL 8.0 (H) 07/03/2019     4. Gastroesophageal reflux disease, unspecified whether esophagitis present Does well as long as she tales her protonix.  5. Esophageal dysphagia No problems recently  6. Intractable chronic migraine without aura and with status migrainosus ***  7. Panic disorder She has panic attacks frequently. As long as she stays at home th epanick attacks are bearable.   8. Bipolar II disorder (HCC) She stays in depressed state all the time. She denies any suicidal thoughts. Sh eis currently on lamictal and zoloft.   9. Agoraphobia with panic attacks Does not like to go out in public and be around people at all. She only goes where she has to.  10. Urge incontinence of urine Uses detrol LA which helps some , but she still has frequent episodes.  11. Binge eating disorder She has tried vyvanse for this in the past which did not really help. Sh esays she has good days and bad days. Some days she will eat whatever she can get her hands on.  12. Severe obesity (BMI >= 40) (HCC) ***    Outpatient Encounter Medications as of 12/18/2020  Medication Sig  . amoxicillin-clavulanate  (AUGMENTIN) 875-125 MG tablet Take 1 tablet by mouth 2 (two) times daily.  . betamethasone dipropionate (DIPROLENE) 0.05 % cream Apply topically 2 (two) times daily. (Patient taking differently: Apply 1 application topically 2 (two) times daily as needed (ezcema.). )  . cetirizine (ZYRTEC) 10 MG tablet TAKE 1 TABLET BY MOUTH EVERY DAY  . Cholecalciferol (VITAMIN D) 2000 UNITS CAPS Take 2,000 Units by mouth daily.   . fluticasone (FLONASE) 50 MCG/ACT nasal spray INSTILL 1 SPRAY INTO EACH NOSTRIL 2 TIMES DAILY  . hydrochlorothiazide (HYDRODIURIL) 25 MG tablet Take 1 tablet (25 mg total) by mouth daily.  Marland Kitchen lamoTRIgine (LAMICTAL) 200 MG tablet TAKE 1 TABLET BY MOUTH EVERY DAY  . levothyroxine (SYNTHROID) 88 MCG tablet TAKE 1 TABLET BY MOUTH EVERY DAY  . losartan (COZAAR) 100 MG tablet Take 1 tablet (100 mg total) by mouth daily.  Melene Muller ON 01/20/2021] Lurasidone HCl 60 MG TABS Take 1 tablet (60 mg total) by mouth daily.  . medroxyPROGESTERone Acetate 150 MG/ML SUSY INJECT 1 ML INTO THE MUSCLE EVERY 3 MONTHS. (NEEDS TO BE SEEN BEFORE NEXT REFILL)  . metoprolol succinate (TOPROL-XL) 50 MG 24 hr tablet Take with or immediately following a meal.  . naproxen (NAPROSYN) 500 MG tablet TAKE 1 TABLET (500 MG TOTAL) BY MOUTH 2 (TWO) TIMES DAILY WITH A MEAL. (Patient taking differently: Take 500 mg by mouth 2 (two) times daily as needed (  pain). )  . naproxen sodium (ALEVE) 220 MG tablet Take 220-440 mg by mouth 2 (two) times daily as needed (for pain.).  Marland Kitchen pantoprazole (PROTONIX) 40 MG tablet TAKE 1 TABLET BY MOUTH EVERY DAY  . promethazine (PHENERGAN) 25 MG tablet Take 0.5 tablets (12.5 mg total) by mouth every 6 (six) hours as needed for nausea or vomiting.  . sertraline (ZOLOFT) 100 MG tablet Take 2 tablets daily  . tolterodine (DETROL LA) 4 MG 24 hr capsule Take 1 capsule (4 mg total) by mouth daily. (Needs to be seen before next refill)  . triamcinolone cream (KENALOG) 0.1 % Apply 1 application topically 2  (two) times daily. (Patient taking differently: Apply 1 application topically 2 (two) times daily as needed (ezcema). )    Past Surgical History:  Procedure Laterality Date  . CESAREAN SECTION      Family History  Problem Relation Age of Onset  . Hypertension Mother   . Cancer Mother        skin,face  . Colon polyps Mother        had to have surgery. Possible in 44s, early 57s   . Hypertension Father   . Hyperlipidemia Father   . Depression Maternal Aunt   . Bipolar disorder Cousin   . Depression Other   . Suicidality Other   . Colon cancer Neg Hx     New complaints: ***  Social history: ***  Controlled substance contract: ***    Review of Systems     Objective:   Physical Exam        Assessment & Plan:

## 2021-01-21 ENCOUNTER — Ambulatory Visit: Payer: Medicaid Other | Admitting: Nurse Practitioner
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
## 2021-03-16 ENCOUNTER — Other Ambulatory Visit: Payer: Self-pay

## 2021-03-16 ENCOUNTER — Telehealth (INDEPENDENT_AMBULATORY_CARE_PROVIDER_SITE_OTHER): Payer: Medicaid Other | Admitting: Psychiatry

## 2021-03-16 ENCOUNTER — Encounter: Payer: Self-pay | Admitting: Psychiatry

## 2021-03-16 DIAGNOSIS — F3181 Bipolar II disorder: Secondary | ICD-10-CM | POA: Diagnosis not present

## 2021-03-16 MED ORDER — LURASIDONE HCL 60 MG PO TABS: 60.0000 mg | ORAL_TABLET | Freq: Every day | ORAL | 1 refills | Status: DC

## 2021-03-28 ENCOUNTER — Other Ambulatory Visit: Payer: Self-pay | Admitting: Nurse Practitioner

## 2021-03-28 DIAGNOSIS — F3181 Bipolar II disorder: Secondary | ICD-10-CM

## 2021-04-09 ENCOUNTER — Ambulatory Visit: Payer: Medicaid Other | Admitting: Nurse Practitioner

## 2021-04-15 ENCOUNTER — Ambulatory Visit: Payer: Medicaid Other | Admitting: Nurse Practitioner

## 2021-06-05 ENCOUNTER — Encounter: Payer: Self-pay | Admitting: Nurse Practitioner

## 2021-06-05 ENCOUNTER — Other Ambulatory Visit: Payer: Self-pay

## 2021-06-05 ENCOUNTER — Ambulatory Visit: Payer: Medicaid Other | Admitting: Nurse Practitioner

## 2021-06-05 VITALS — BP 156/104 | HR 123 | Temp 96.6°F | Resp 20 | Ht 62.0 in | Wt 238.0 lb

## 2021-06-05 DIAGNOSIS — F410 Panic disorder [episodic paroxysmal anxiety] without agoraphobia: Secondary | ICD-10-CM | POA: Diagnosis not present

## 2021-06-05 DIAGNOSIS — E785 Hyperlipidemia, unspecified: Secondary | ICD-10-CM | POA: Diagnosis not present

## 2021-06-05 DIAGNOSIS — R1319 Other dysphagia: Secondary | ICD-10-CM

## 2021-06-05 DIAGNOSIS — N3941 Urge incontinence: Secondary | ICD-10-CM

## 2021-06-05 DIAGNOSIS — E032 Hypothyroidism due to medicaments and other exogenous substances: Secondary | ICD-10-CM | POA: Diagnosis not present

## 2021-06-05 DIAGNOSIS — I10 Essential (primary) hypertension: Secondary | ICD-10-CM

## 2021-06-05 DIAGNOSIS — F4001 Agoraphobia with panic disorder: Secondary | ICD-10-CM

## 2021-06-05 DIAGNOSIS — K219 Gastro-esophageal reflux disease without esophagitis: Secondary | ICD-10-CM

## 2021-06-05 DIAGNOSIS — F3181 Bipolar II disorder: Secondary | ICD-10-CM

## 2021-06-05 MED ORDER — HYDROCHLOROTHIAZIDE 25 MG PO TABS
25.0000 mg | ORAL_TABLET | Freq: Every day | ORAL | 1 refills | Status: DC
Start: 2021-06-05 — End: 2022-01-07

## 2021-06-05 MED ORDER — TOLTERODINE TARTRATE ER 4 MG PO CP24: 4.0000 mg | ORAL_CAPSULE | Freq: Every day | ORAL | 1 refills | Status: DC

## 2021-06-05 MED ORDER — LURASIDONE HCL 60 MG PO TABS: 60.0000 mg | ORAL_TABLET | Freq: Every day | ORAL | 1 refills | Status: DC

## 2021-06-05 MED ORDER — PANTOPRAZOLE SODIUM 40 MG PO TBEC
40.0000 mg | DELAYED_RELEASE_TABLET | Freq: Every day | ORAL | 1 refills | Status: DC
Start: 2021-06-05 — End: 2021-12-14

## 2021-06-05 MED ORDER — LAMOTRIGINE 200 MG PO TABS: 200.0000 mg | ORAL_TABLET | Freq: Every day | ORAL | 1 refills | Status: DC

## 2021-06-05 MED ORDER — SERTRALINE HCL 100 MG PO TABS: ORAL_TABLET | ORAL | 1 refills | Status: DC

## 2021-06-05 MED ORDER — LOSARTAN POTASSIUM 100 MG PO TABS: 100.0000 mg | ORAL_TABLET | Freq: Every day | ORAL | 1 refills | Status: DC

## 2021-06-05 MED ORDER — LEVOTHYROXINE SODIUM 88 MCG PO TABS: ORAL_TABLET | ORAL | 1 refills | Status: DC

## 2021-06-05 NOTE — Progress Notes (Signed)
Subjective:    Patient ID: Dawn Craig, female    DOB: Mar 31, 1971, 50 y.o.   MRN: [id number]   Chief Complaint: No chief complaint on file.    HPI:  1. Essential hypertension, benign No c/o chest pain, sob or headache. Does not check blood pressure at home. She stopped taking all of her meds due to her depression. BP Readings from Last 3 Encounters:  12/01/18 125/75  10/18/18 131/85  09/06/18 (!) 153/90     2. Hyperlipidemia with target LDL less than 100 Does not watch diet and does no dedicated exercise. Lab Results  Component Value Date   CHOL 193 07/03/2019   HDL 24 (L) 07/03/2019   LDLCALC 104 (H) 07/03/2019   TRIG 327 (H) 07/03/2019   CHOLHDL 8.0 (H) 07/03/2019     3. Gastroesophageal reflux disease, unspecified whether esophagitis present Is on protonix daily works well most days to prevent symptoms.  4. Esophageal dysphagia Hs occasionaly. Breads seem to be the worst.  5. 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 Agoraphobia with panic attacks Never goes out of the house. Stays in bed alll the time. GAD 7 : Generalized Anxiety Score 06/05/2021 01/18/2020 10/18/2019  Nervous, Anxious, on Edge 3 2 2   Control/stop worrying 3 2 2   Worry too much - different things 2 2 1   Trouble relaxing 2 1 1   Restless 1 0 1  Easily annoyed or irritable 2 0 2  Afraid - awful might happen 2 0 2  Total GAD 7 Score 15 7 11   Anxiety Difficulty Very difficult Not difficult at all Somewhat difficult      7. Panic disorder She has not had  any panic attacks. Thinks because she has not been out of the house.  8. Urge incontinence of urine Does not want to do anything for this. Has incontinence occasionally  9. Bipolar    II Patient is not doing well. She sleeps all the time and she stopped taking all of her meds 2 weeks ago. She was on zoloft and lamictal.  She is just severely depressed. Sh esays she wishes she was dead but she would never be able to do anything to herself. [facility] 06/05/2021 01/18/2020 10/18/2019  Decreased Interest 2 3 2   Down, Depressed, Hopeless 3 2 2   PHQ - 2 Score 5 5 4   Altered sleeping 3 3 2   Tired, decreased energy 2 2 2   Change in appetite 3 3 2   Feeling bad or failure about yourself  2 2 2   Trouble concentrating 2 1 2   Moving slowly or fidgety/restless 2 0 1  Suicidal thoughts 3 0 0  PHQ-9 Score 22 16 15   Difficult doing work/chores Very difficult Somewhat difficult Somewhat difficult  Some encounter information is confidential and restricted. Go to Review Flowsheets activity to see all data.  Some recent data might be hidden     10 Severe obesity (BMI >= 40) (HCC) Weight is down 22lbs since last visit. Sh ehas not been trying to lose weight and doe sn exercise. Wt Readings from Last 3 Encounters:  06/05/21 238 lb (108 kg)  12/01/18 260 lb 12.8 oz (118.3 kg)  10/18/18 265 lb 12.8 oz (120.6 kg)   BMI Readings from Last 3 Encounters:  06/05/21 43.53 kg/m  12/01/18 47.70 kg/m  10/18/18 50.22 kg/m  Outpatient Encounter Medications as of 06/05/2021  Medication Sig  . amoxicillin-clavulanate (AUGMENTIN) 875-125 MG tablet Take 1 tablet by mouth 2 (two) times daily.  . betamethasone dipropionate (DIPROLENE) 0.05 % cream Apply topically 2 (two) times daily. (Patient taking differently: Apply 1 application topically 2 (two) times daily as needed (ezcema.). )  . cetirizine (ZYRTEC) 10 MG tablet TAKE 1 TABLET BY MOUTH EVERY DAY  . Cholecalciferol (VITAMIN D) 2000 UNITS CAPS Take 2,000 Units by mouth daily.   . fluticasone (FLONASE) 50 MCG/ACT nasal spray INSTILL 1 SPRAY INTO EACH NOSTRIL 2 TIMES DAILY  . hydrochlorothiazide (HYDRODIURIL) 25 MG tablet Take 1 tablet (25 mg total) by mouth daily.  Marland Kitchen lamoTRIgine (LAMICTAL) 200 MG tablet TAKE 1 TABLET BY MOUTH EVERY DAY  . levothyroxine  (SYNTHROID) 88 MCG tablet TAKE 1 TABLET BY MOUTH EVERY DAY  . losartan (COZAAR) 100 MG tablet Take 1 tablet (100 mg total) by mouth daily.  . Lurasidone HCl 60 MG TABS Take 1 tablet (60 mg total) by mouth daily.  . medroxyPROGESTERone Acetate 150 MG/ML SUSY INJECT 1 ML INTO THE MUSCLE EVERY 3 MONTHS. (NEEDS TO BE SEEN BEFORE NEXT REFILL)  . metoprolol succinate (TOPROL-XL) 50 MG 24 hr tablet Take with or immediately following a meal.  . naproxen (NAPROSYN) 500 MG tablet TAKE 1 TABLET (500 MG TOTAL) BY MOUTH 2 (TWO) TIMES DAILY WITH A MEAL. (Patient taking differently: Take 500 mg by mouth 2 (two) times daily as needed (pain). )  . naproxen sodium (ALEVE) 220 MG tablet Take 220-440 mg by mouth 2 (two) times daily as needed (for pain.).  Marland Kitchen pantoprazole (PROTONIX) 40 MG tablet TAKE 1 TABLET BY MOUTH EVERY DAY  . promethazine (PHENERGAN) 25 MG tablet Take 0.5 tablets (12.5 mg total) by mouth every 6 (six) hours as needed for nausea or vomiting.  . sertraline (ZOLOFT) 100 MG tablet Take 2 tablets daily  . tolterodine (DETROL LA) 4 MG 24 hr capsule Take 1 capsule (4 mg total) by mouth daily. (Needs to be seen before next refill)  . triamcinolone cream (KENALOG) 0.1 % Apply 1 application topically 2 (two) times daily. (Patient taking differently: Apply 1 application topically 2 (two) times daily as needed (ezcema). )   Facility-Administered Encounter Medications as of 06/05/2021  Medication  . medroxyPROGESTERone (DEPO-PROVERA) injection 150 mg    Past Surgical History:  Procedure Laterality Date  . CESAREAN SECTION      Family History  Problem Relation Age of Onset  . Hypertension Mother   . Cancer Mother        skin,face  . Colon polyps Mother        had to have surgery. Possible in 35s, early 69s   . Hypertension Father   . Hyperlipidemia Father   . Depression Maternal Aunt   . Bipolar disorder Cousin   . Depression Other   . Suicidality Other   . Colon cancer Neg Hx     New  complaints: None today  Social history: Lives by herself. Her brother in law helps her a lot.  Controlled substance contract: n/a    Review of Systems  Constitutional:  Negative for diaphoresis.  Eyes:  Negative for pain.  Respiratory:  Negative for shortness of breath.   Cardiovascular:  Negative for chest pain, palpitations and leg swelling.  Gastrointestinal:  Negative for abdominal pain.  Endocrine: Negative for polydipsia.  Skin:  Negative for rash.  Neurological:  Negative for dizziness, weakness and headaches.  Hematological:  Does  not bruise/bleed easily.  All other systems reviewed and are negative.     Objective:   Physical Exam Vitals and nursing note reviewed.  Constitutional:      General: She is not in acute distress.    Appearance: Normal appearance. She is well-developed.  HENT:     Head: Normocephalic.     Right Ear: Tympanic membrane normal.     Left Ear: Tympanic membrane normal.     Nose: Nose normal.     Mouth/Throat:     Mouth: Mucous membranes are moist.  Eyes:     Pupils: Pupils are equal, round, and reactive to light.  Neck:     Vascular: No carotid bruit or JVD.  Cardiovascular:     Rate and Rhythm: Normal rate and regular rhythm.     Heart sounds: Normal heart sounds.  Pulmonary:     Effort: Pulmonary effort is normal. No respiratory distress.     Breath sounds: Normal breath sounds. No wheezing or rales.  Chest:     Chest wall: No tenderness.  Abdominal:     General: Bowel sounds are normal. There is no distension or abdominal bruit.     Palpations: Abdomen is soft. There is no hepatomegaly, splenomegaly, mass or pulsatile mass.     Tenderness: There is no abdominal tenderness.  Musculoskeletal:        General: Normal range of motion.     Cervical back: Normal range of motion and neck supple.  Lymphadenopathy:     Cervical: No cervical adenopathy.  Skin:    General: Skin is warm and dry.  Neurological:     Mental Status: She is  alert and oriented to person, place, and time.     Deep Tendon Reflexes: Reflexes are normal and symmetric.  Psychiatric:        Behavior: Behavior normal.        Thought Content: Thought content normal.        Judgment: Judgment normal.    BP (!) 156/104   Pulse (!) 123   Temp (!) 96.6 F (35.9 C) (Temporal)   Resp 20   Ht 5\' 2"  (1.575 m)   Wt 238 lb (108 kg)   SpO2 96%   BMI 43.53 kg/m        Assessment & Plan:  Dawn Craig comes in today with chief complaint of Medical Management of Chronic Issues   Diagnosis and orders addressed:  1. Essential hypertension, benign Low sodium diet - hydrochlorothiazide (HYDRODIURIL) 25 MG tablet; Take 1 tablet (25 mg total) by mouth daily.  Dispense: 90 tablet; Refill: 1 - losartan (COZAAR) 100 MG tablet; Take 1 tablet (100 mg total) by mouth daily.  Dispense: 90 tablet; Refill: 1  2. Hyperlipidemia with target LDL less than 100 Low fat diet  3. Gastroesophageal reflux disease, unspecified whether esophagitis present Avoid spicy foods Do not eat 2 hours prior to bedtime  4. Esophageal dysphagia Chew foods well before swallow Drink more liquids  5. 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 Agoraphobia with panic attacks  7. Panic disorder Stress manageemnt - sertraline (ZOLOFT) 100 MG tablet; Take 2 tablets daily  Dispense: 180 tablet; Refill: 1  8. Urge incontinence of urine - tolterodine (DETROL LA) 4 MG 24 hr capsule; Take 1 capsule (4 mg total) by  mouth daily. (Needs to be seen before next refill)  Dispense: 90 capsule; Refill: 1  9. Severe obesity (BMI >= 40) (HCC) Discussed diet and exercise for person with BMI >25 Will recheck weight in 3-6 months  10. Bipolar II disorder (HCC) Refferal to psych - lamoTRIgine (LAMICTAL) 200 MG tablet;  Take 1 tablet (200 mg total) by mouth daily.  Dispense: 90 tablet; Refill: 1 - sertraline (ZOLOFT) 100 MG tablet; Take 2 tablets daily  Dispense: 180 tablet; Refill: 1 - Lurasidone HCl 60 MG TABS; Take 1 tablet (60 mg total) by mouth daily.  Dispense: 90 tablet; Refill: 1 - Ambulatory referral to Psychiatry  11. Gastroesophageal reflux disease Avoid spicy foods Do not eat 2 hours prior to bedtime - pantoprazole (PROTONIX) 40 MG tablet; Take 1 tablet (40 mg total) by mouth daily.  Dispense: 90 tablet; Refill: 1   Labs pending Health Maintenance reviewed Diet and exercise encouraged  Follow up plan: 1 month   Mary-Margaret Daphine Deutscher, FNP

## 2021-06-05 NOTE — Patient Instructions (Signed)
Textbook of family medicine (9th ed., pp. 1062-1073). [place], [place]: [employer].">  Stress, Adult Stress is a normal reaction to life events. Stress is what you feel when life demands more than you are used to, or more than you think you can handle. Some stress can be useful, such as studying for a test or meeting a deadline at work. Stress that occurs too often or for too long can cause problems. It can affect your emotional health and interfere with relationships and normal daily activities. Too much stress can weaken your body's defense system (immune system) and increase your risk for physical illness. If you already have a medicalproblem, stress can make it worse. What are the causes? All sorts of life events can cause stress. An event that causes stress for one person may not be stressful for another person. Major life events, whether positive or negative, commonly cause stress. Examples include: Losing a job or starting a new job. Losing a loved one. Moving to a new town or home. Getting married or divorced. Having a baby. Getting injured or sick. Less obvious life events can also cause stress, especially if they occur day after day or in combination with each other. Examples include: Working long hours. Driving in traffic. Caring for children. Being in debt. Being in a difficult relationship. What are the signs or symptoms? Stress can cause emotional symptoms, including: Anxiety. This is feeling worried, afraid, on edge, overwhelmed, or out of control. Anger, including irritation or impatience. Depression. This is feeling sad, down, helpless, or guilty. Trouble focusing, remembering, or making decisions. Stress can cause physical symptoms, including: Aches and pains. These may affect your head, neck, back, stomach, or other areas of your body. Tight muscles or a clenched jaw. Low energy. Trouble sleeping. Stress can cause unhealthy behaviors, including: Eating to feel better  (overeating) or skipping meals. Working too much or putting off tasks. Smoking, drinking alcohol, or using drugs to feel better. How is this diagnosed? Stress is diagnosed through an assessment by your health care provider. He or she may diagnose this condition based on: Your symptoms and any stressful life events. Your medical history. Tests to rule out other causes of your symptoms. Depending on your condition, your health care provider may refer you to aspecialist for further evaluation. How is this treated?  Stress management techniques are the recommended treatment for stress. Medicineis not typically recommended for the treatment of stress. Techniques to reduce your reaction to stressful life events include: Stress identification. Monitor yourself for symptoms of stress and identify what causes stress for you. These skills may help you to avoid or prepare for stressful events. Time management. Set your priorities, keep a calendar of events, and learn to say no. Taking these actions can help you avoid making too many commitments. Techniques for coping with stress include: Rethinking the problem. Try to think realistically about stressful events rather than ignoring them or overreacting. Try to find the positives in a stressful situation rather than focusing on the negatives. Exercise. Physical exercise can release both physical and emotional tension. The key is to find a form of exercise that you enjoy and do it regularly. Relaxation techniques. These relax the body and mind. The key is to find one or more that you enjoy and use the techniques regularly. Examples include: Meditation, deep breathing, or progressive relaxation techniques. Yoga or tai chi. Biofeedback, mindfulness techniques, or journaling. Listening to music, being out in nature, or participating in other hobbies. Practicing a healthy lifestyle.   Eat a balanced diet, drink plenty of water, limit or avoid caffeine, and get  plenty of sleep. Having a strong support network. Spend time with family, friends, or other people you enjoy being around. Express your feelings and talk things over with someone you trust. Counseling or talk therapy with a mental health professional may be helpful if you are havingtrouble managing stress on your own. Follow these instructions at home: Lifestyle  Avoid drugs. Do not use any products that contain nicotine or tobacco, such as cigarettes, e-cigarettes, and chewing tobacco. If you need help quitting, ask your health care provider. Limit alcohol intake to no more than 1 drink a day for nonpregnant women and 2 drinks a day for men. One drink equals 12 oz of beer, 5 oz of wine, or 1 oz of hard liquor Do not use alcohol or drugs to relax. Eat a balanced diet that includes fresh fruits and vegetables, whole grains, lean meats, fish, eggs, and beans, and low-fat dairy. Avoid processed foods and foods high in added fat, sugar, and salt. Exercise at least 30 minutes on 5 or more days each week. Get 7-8 hours of sleep each night.  General instructions  Practice stress management techniques as discussed with your health care provider. Drink enough fluid to keep your urine clear or pale yellow. Take over-the-counter and prescription medicines only as told by your health care provider. Keep all follow-up visits as told by your health care provider. This is important.  Contact a health care provider if: Your symptoms get worse. You have new symptoms. You feel overwhelmed by your problems and can no longer manage them on your own. Get help right away if: You have thoughts of hurting yourself or others. If you ever feel like you may hurt yourself or others, or have thoughts about taking your own life, get help right away. You can go to your nearest emergency department or call: Your local emergency services (911 in the [place] A suicide crisis helpline, such as the [facility] at 4253100524. This is open 24 hours a day. Summary Stress is a normal reaction to life events. It can cause problems if it happens too often or for too long. Practicing stress management techniques is the best way to treat stress. Counseling or talk therapy with a mental health professional may be helpful if you are having trouble managing stress on your own. This information is not intended to replace advice given to you by your health care provider. Make sure you discuss any questions you have with your healthcare provider. Document Revised: 08/01/2020 Document Reviewed: 08/01/2020 Elsevier Patient Education  2022 [employer].

## 2021-07-07 ENCOUNTER — Ambulatory Visit: Payer: Self-pay | Admitting: Nurse Practitioner

## 2021-07-14 ENCOUNTER — Encounter: Payer: Self-pay | Admitting: Nurse Practitioner

## 2021-07-14 ENCOUNTER — Ambulatory Visit: Payer: Medicaid Other | Admitting: Nurse Practitioner

## 2021-07-14 DIAGNOSIS — F419 Anxiety disorder, unspecified: Secondary | ICD-10-CM | POA: Diagnosis not present

## 2021-07-14 DIAGNOSIS — J069 Acute upper respiratory infection, unspecified: Secondary | ICD-10-CM

## 2021-07-14 DIAGNOSIS — B372 Candidiasis of skin and nail: Secondary | ICD-10-CM | POA: Diagnosis not present

## 2021-07-14 MED ORDER — BENZONATATE 100 MG PO CAPS: 100.0000 mg | ORAL_CAPSULE | Freq: Three times a day (TID) | ORAL | 0 refills | Status: DC | PRN

## 2021-07-14 MED ORDER — NYSTATIN 100000 UNIT/GM EX CREA: 1.0000 "application " | TOPICAL_CREAM | Freq: Two times a day (BID) | CUTANEOUS | 1 refills | Status: AC

## 2021-07-14 MED ORDER — AZITHROMYCIN 250 MG PO TABS: ORAL_TABLET | ORAL | 0 refills | Status: DC

## 2021-07-14 MED ORDER — FLUCONAZOLE 150 MG PO TABS
150.0000 mg | ORAL_TABLET | Freq: Once | ORAL | 0 refills | Status: AC
Start: 2021-07-14 — End: 2021-07-14

## 2021-07-14 NOTE — Progress Notes (Signed)
Virtual Visit  Note Due to COVID-19 pandemic this visit was conducted virtually. This visit type was conducted due to national recommendations for restrictions regarding the COVID-19 Pandemic (e.g. social distancing, sheltering in place) in an effort to limit this patient's exposure and mitigate transmission in our community. All issues noted in this document were discussed and addressed.  A physical exam was not performed with this format.  I connected with Dawn Craig on 07/14/21 at 12:05 by telephone and verified that I am speaking with the correct person using two identifiers. Dawn Craig is currently located at home and no one is currently with her during visit. The provider, Mary-Margaret Daphine Deutscher, FNP is located in their office at time of visit.  I discussed the limitations, risks, security and privacy concerns of performing an evaluation and management service by telephone and the availability of in person appointments. I also discussed with the patient that there may be a patient responsible charge related to this service. The patient expressed understanding and agreed to proceed.   History and Present Illness:   Chief Complaint: covid symptoms and anxiety  HPI Patient has 2 complaints: - patient c/o cough and congestion with altered smell and sinus pressure. Deep cough. She has not been out of her house or around anyone ina moth.  - axillary rash that is itchy on right side. Has ful smell. - anxiety- no better. Has panic attacks when she thinks she has to go out of her house. Is on lamictal and zoloft. She was on latuda but stopped taking due  to side effects. Referral was made for psych in July and sh ehas not heard anything     Review of Systems  Constitutional:  Positive for chills, fever and malaise/fatigue.  HENT:  Positive for congestion and sinus pain.   Respiratory:  Positive for cough and sputum production.   Neurological:  Positive for headaches.     Observations/Objective: Alert and oriented- answers all questions appropriately No distress Voice hoarse Cough deep and dry sounding  Assessment and Plan: Dawn Craig in today with chief complaint of Covid Exposure and Anxiety (/)   1. URI with cough and congestion 1. Take meds as prescribed 2. Use a cool mist humidifier especially during the winter months and when heat has been humid. 3. Use saline nose sprays frequently 4. Saline irrigations of the nose can be very helpful if done frequently.  * 4X daily for 1 week*  * Use of a nettie pot can be helpful with this. Follow directions with this* 5. Drink plenty of fluids 6. Keep thermostat turn down low 7.For any cough or congestion  Use plain Mucinex- regular strength or max strength is fine   * Children- consult with Pharmacist for dosing 8. For fever or aces or pains- take tylenol or ibuprofen appropriate for age and weight.  * for fevers greater than 101 orally you may alternate ibuprofen and tylenol every  3 hours.     2. Cutaneous candidiasis Keep cream and dry  3. Anxiety Will check on referral Continue current meds     Follow Up Instructions: prn    I discussed the assessment and treatment plan with the patient. The patient was provided an opportunity to ask questions and all were answered. The patient agreed with the plan and demonstrated an understanding of the instructions.   The patient was advised to call back or seek an in-person evaluation if the symptoms worsen or if the condition fails to improve as  anticipated.  The above assessment and management plan was discussed with the patient. The patient verbalized understanding of and has agreed to the management plan. Patient is aware to call the clinic if symptoms persist or worsen. Patient is aware when to return to the clinic for a follow-up visit. Patient educated on when it is appropriate to go to the emergency department.   Time call ended:  12  18  I provided 13 minutes of  non face-to-face time during this encounter.    Mary-Margaret Daphine Deutscher, FNP

## 2021-07-16 ENCOUNTER — Other Ambulatory Visit: Payer: Self-pay

## 2021-07-16 DIAGNOSIS — F4001 Agoraphobia with panic disorder: Secondary | ICD-10-CM

## 2021-07-20 NOTE — Addendum Note (Signed)
Addended by: Tamera Punt on: 07/20/2021 08:42 AM   Modules accepted: Orders

## 2021-09-15 ENCOUNTER — Encounter: Payer: Self-pay | Admitting: Nurse Practitioner

## 2021-09-15 ENCOUNTER — Ambulatory Visit: Payer: Medicaid Other | Admitting: Nurse Practitioner

## 2021-09-15 DIAGNOSIS — H1031 Unspecified acute conjunctivitis, right eye: Secondary | ICD-10-CM

## 2021-09-15 DIAGNOSIS — J0101 Acute recurrent maxillary sinusitis: Secondary | ICD-10-CM | POA: Diagnosis not present

## 2021-09-15 MED ORDER — LEVOFLOXACIN 500 MG PO TABS: 500.0000 mg | ORAL_TABLET | Freq: Every day | ORAL | 0 refills | Status: DC

## 2021-09-15 MED ORDER — POLYMYXIN B-TRIMETHOPRIM 10000-0.1 UNIT/ML-% OP SOLN: 1.0000 [drp] | OPHTHALMIC | 0 refills | Status: DC

## 2021-09-15 MED ORDER — PROMETHAZINE-DM 6.25-15 MG/5ML PO SYRP: 5.0000 mL | ORAL_SOLUTION | Freq: Four times a day (QID) | ORAL | 0 refills | Status: DC | PRN

## 2021-09-15 NOTE — Progress Notes (Signed)
Virtual Visit  Note Due to COVID-19 pandemic this visit was conducted virtually. This visit type was conducted due to national recommendations for restrictions regarding the COVID-19 Pandemic (e.g. social distancing, sheltering in place) in an effort to limit this patient's exposure and mitigate transmission in our community. All issues noted in this document were discussed and addressed.  A physical exam was not performed with this format.  I connected with Dawn Craig on 09/15/21 at 4:37 by telephone and verified that I am speaking with the correct person using two identifiers. Dawn Craig is currently located at home and no one is currently with her during visit. The provider, Mary-Margaret Daphine Deutscher, FNP is located in their office at time of visit.  I discussed the limitations, risks, security and privacy concerns of performing an evaluation and management service by telephone and the availability of in person appointments. I also discussed with the patient that there may be a patient responsible charge related to this service. The patient expressed understanding and agreed to proceed.   History and Present Illness:  Chief Complaint: Sinusitis   HPI Patient states that she has sinus pressure and headache. Drainage has turned form clear to green. No fever. Slight cough. Foul smell in nose. Right eye was matted together this morning and white part of eye is red.    Review of Systems  Constitutional:  Positive for chills and fever (?).  HENT:  Positive for ear pain (pressure bil) and sinus pain. Negative for ear discharge and sore throat.   Eyes:  Positive for discharge and redness.  Respiratory:  Positive for cough.   Genitourinary: Negative.   Musculoskeletal:  Negative for myalgias.    Observations/Objective: Alert and oriented- answers all questions appropriately No distress Dry tight cough Says sclera of right eye is erythematous  Assessment and Plan: Dawn Craig in today  with chief complaint of Sinusitis   1. Acute recurrent maxillary sinusitis 1. Take meds as prescribed 2. Use a cool mist humidifier especially during the winter months and when heat has been humid. 3. Use saline nose sprays frequently 4. Saline irrigations of the nose can be very helpful if done frequently.  * 4X daily for 1 week*  * Use of a nettie pot can be helpful with this. Follow directions with this* 5. Drink plenty of fluids 6. Keep thermostat turn down low 7.For any cough or congestion  Use plain Mucinex- regular strength or max strength is fine   * Children- consult with Pharmacist for dosing 8. For fever or aces or pains- take tylenol or ibuprofen appropriate for age and weight.  * for fevers greater than 101 orally you may alternate ibuprofen and tylenol every  3 hours.   - promethazine-dextromethorphan (PROMETHAZINE-DM) 6.25-15 MG/5ML syrup; Take 5 mLs by mouth 4 (four) times daily as needed for cough.  Dispense: 240 mL; Refill: 0  - levofloxacin (LEVAQUIN) 500 MG tablet; Take 1 tablet (500 mg total) by mouth daily.  Dispense: 7 tablet; Refill: 0  2. Acute bacterial conjunctivitis of right eye Good hand washing Avoid rubbing eyes Cool compresses - trimethoprim-polymyxin b (POLYTRIM) ophthalmic solution; Place 1 drop into both eyes every 4 (four) hours.  Dispense: 10 mL; Refill: 0    Follow Up Instructions: prn    I discussed the assessment and treatment plan with the patient. The patient was provided an opportunity to ask questions and all were answered. The patient agreed with the plan and demonstrated an understanding of the instructions.  The patient was advised to call back or seek an in-person evaluation if the symptoms worsen or if the condition fails to improve as anticipated.  The above assessment and management plan was discussed with the patient. The patient verbalized understanding of and has agreed to the management plan. Patient is aware to call the  clinic if symptoms persist or worsen. Patient is aware when to return to the clinic for a follow-up visit. Patient educated on when it is appropriate to go to the emergency department.   Time call ended:  4:50  I provided 13 minutes of  non face-to-face time during this encounter.    Mary-Margaret Daphine Deutscher, FNP  3

## 2021-09-17 ENCOUNTER — Ambulatory Visit (INDEPENDENT_AMBULATORY_CARE_PROVIDER_SITE_OTHER): Payer: Medicaid Other | Admitting: Family Medicine

## 2021-09-17 ENCOUNTER — Encounter: Payer: Self-pay | Admitting: Family Medicine

## 2021-09-17 ENCOUNTER — Other Ambulatory Visit: Payer: Self-pay | Admitting: Nurse Practitioner

## 2021-09-17 DIAGNOSIS — Z8619 Personal history of other infectious and parasitic diseases: Secondary | ICD-10-CM

## 2021-09-17 DIAGNOSIS — B372 Candidiasis of skin and nail: Secondary | ICD-10-CM

## 2021-09-17 DIAGNOSIS — N898 Other specified noninflammatory disorders of vagina: Secondary | ICD-10-CM

## 2021-09-17 MED ORDER — FLUCONAZOLE 150 MG PO TABS: ORAL_TABLET | ORAL | 0 refills | Status: DC

## 2021-09-17 NOTE — Progress Notes (Signed)
Virtual Visit via telephone Note Due to COVID-19 pandemic this visit was conducted virtually. This visit type was conducted due to national recommendations for restrictions regarding the COVID-19 Pandemic (e.g. social distancing, sheltering in place) in an effort to limit this patient's exposure and mitigate transmission in our community. All issues noted in this document were discussed and addressed.  A physical exam was not performed with this format.   I connected with Dawn Craig on 09/17/2021 at 1330 by telephone and verified that I am speaking with the correct person using two identifiers. Dawn Craig is currently located at home and  no one  is currently with them during visit. The provider, Kari Baars, FNP is located in their office at time of visit.  I discussed the limitations, risks, security and privacy concerns of performing an evaluation and management service by telephone and the availability of in person appointments. I also discussed with the patient that there may be a patient responsible charge related to this service. The patient expressed understanding and agreed to proceed.  Subjective:  Patient ID: Dawn Craig, female    DOB: [birth year], 50 y.o.   MRN: [id number]  Chief Complaint:  Vaginal Itching   HPI: Dawn Craig is a 50 y.o. female presenting on 09/17/2021 for Vaginal Itching   Patient reports she was recently started on antibiotic therapy for acute sinusitis.  States every time she is treated with antibiotics she develops vaginal candidiasis.  States over the last days she has had some valvular pruritus and erythema.  No reported vaginal discharge.  Not currently sexually active, no indications of STI.  Vaginal Itching The patient's primary symptoms include genital itching and a genital rash. The patient's pertinent negatives include no genital lesions, genital odor, missed menses, pelvic pain, vaginal bleeding or vaginal discharge. This is a new problem.  The current episode started yesterday. The problem has been gradually worsening. The pain is mild. The problem affects both sides. She is not pregnant. Pertinent negatives include no abdominal pain, anorexia, back pain, chills, constipation, diarrhea, discolored urine, dysuria, fever, flank pain, frequency, headaches, hematuria, joint pain, joint swelling, nausea, painful intercourse, rash, sore throat, urgency or vomiting. The symptoms are aggravated by tactile pressure and urinating. She has tried nothing for the symptoms. She is not sexually active.    Relevant past medical, surgical, family, and social history reviewed and updated as indicated.  Allergies and medications reviewed and updated.   Past Medical History:  Diagnosis Date   Anxiety    Bipolar 1 disorder (HCC)    GERD (gastroesophageal reflux disease)    H/O sinus surgery    Hyperlipidemia    Hypertension    Thyroid disease     Past Surgical History:  Procedure Laterality Date   CESAREAN SECTION      Social History   Socioeconomic History   Marital status: Divorced    Spouse name: Not on file   Number of children: 1   Years of education: Not on file   Highest education level: Not on file  Occupational History   Not on file  Tobacco Use   Smoking status: Never   Smokeless tobacco: Never  Substance and Sexual Activity   Alcohol use: No   Drug use: No   Sexual activity: Not on file  Other Topics Concern   Not on file  Social History Narrative   50 year old female, 20 in May 2020.    Social Determinants of Health   Financial Resource Strain: Not on  file  Food Insecurity: Not on file  Transportation Needs: Not on file  Physical Activity: Not on file  Stress: Not on file  Social Connections: Not on file  Intimate Partner Violence: Not on file    Outpatient Encounter Medications as of 09/17/2021  Medication Sig   fluconazole (DIFLUCAN) 150 MG tablet 1 po q week x 4 weeks   azithromycin (ZITHROMAX Z-PAK)  250 MG tablet As directed   benzonatate (TESSALON PERLES) 100 MG capsule Take 1 capsule (100 mg total) by mouth 3 (three) times daily as needed.   betamethasone dipropionate (DIPROLENE) 0.05 % cream Apply topically 2 (two) times daily. (Patient not taking: Reported on 06/05/2021)   cetirizine (ZYRTEC) 10 MG tablet TAKE 1 TABLET BY MOUTH EVERY DAY (Patient not taking: Reported on 06/05/2021)   Cholecalciferol (VITAMIN D) 2000 UNITS CAPS Take 2,000 Units by mouth daily.  (Patient not taking: Reported on 06/05/2021)   fluticasone (FLONASE) 50 MCG/ACT nasal spray INSTILL 1 SPRAY INTO EACH NOSTRIL 2 TIMES DAILY (Patient not taking: Reported on 06/05/2021)   hydrochlorothiazide (HYDRODIURIL) 25 MG tablet Take 1 tablet (25 mg total) by mouth daily.   lamoTRIgine (LAMICTAL) 200 MG tablet Take 1 tablet (200 mg total) by mouth daily.   levofloxacin (LEVAQUIN) 500 MG tablet Take 1 tablet (500 mg total) by mouth daily.   levothyroxine (SYNTHROID) 88 MCG tablet TAKE 1 TABLET BY MOUTH EVERY DAY   losartan (COZAAR) 100 MG tablet Take 1 tablet (100 mg total) by mouth daily.   medroxyPROGESTERone Acetate 150 MG/ML SUSY INJECT 1 ML INTO THE MUSCLE EVERY 3 MONTHS. (NEEDS TO BE SEEN BEFORE NEXT REFILL) (Patient not taking: Reported on 06/05/2021)   metoprolol succinate (TOPROL-XL) 50 MG 24 hr tablet Take with or immediately following a meal. (Patient not taking: Reported on 06/05/2021)   naproxen (NAPROSYN) 500 MG tablet TAKE 1 TABLET (500 MG TOTAL) BY MOUTH 2 (TWO) TIMES DAILY WITH A MEAL. (Patient not taking: Reported on 06/05/2021)   naproxen sodium (ALEVE) 220 MG tablet Take 220-440 mg by mouth 2 (two) times daily as needed (for pain.). (Patient not taking: Reported on 06/05/2021)   nystatin cream (MYCOSTATIN) Apply 1 application topically 2 (two) times daily.   pantoprazole (PROTONIX) 40 MG tablet Take 1 tablet (40 mg total) by mouth daily.   promethazine (PHENERGAN) 25 MG tablet Take 0.5 tablets (12.5 mg total) by mouth every 6  (six) hours as needed for nausea or vomiting. (Patient not taking: Reported on 06/05/2021)   promethazine-dextromethorphan (PROMETHAZINE-DM) 6.25-15 MG/5ML syrup Take 5 mLs by mouth 4 (four) times daily as needed for cough.   sertraline (ZOLOFT) 100 MG tablet Take 2 tablets daily   tolterodine (DETROL LA) 4 MG 24 hr capsule Take 1 capsule (4 mg total) by mouth daily. (Needs to be seen before next refill)   triamcinolone cream (KENALOG) 0.1 % Apply 1 application topically 2 (two) times daily. (Patient not taking: Reported on 06/05/2021)   trimethoprim-polymyxin b (POLYTRIM) ophthalmic solution Place 1 drop into both eyes every 4 (four) hours.   Facility-Administered Encounter Medications as of 09/17/2021  Medication   medroxyPROGESTERone (DEPO-PROVERA) injection 150 mg    Allergies  Allergen Reactions   Ace Inhibitors Cough   Famvir [Famciclovir] Hives and Swelling    Throat closes    Tetanus Toxoids     Swelling at sight    Review of Systems  Constitutional:  Negative for activity change, appetite change, chills, diaphoresis, fatigue, fever and unexpected weight change.  HENT:  Negative for sore throat.   Gastrointestinal:  Negative for abdominal pain, anorexia, constipation, diarrhea, nausea and vomiting.  Genitourinary:  Negative for decreased urine volume, difficulty urinating, dyspareunia, dysuria, enuresis, flank pain, frequency, genital sores, hematuria, menstrual problem, missed menses, pelvic pain, urgency, vaginal bleeding, vaginal discharge and vaginal pain.       Vaginal irritation and erythema with pruritis  Musculoskeletal:  Negative for back pain and joint pain.  Skin:  Negative for rash.  Neurological:  Negative for weakness and headaches.  Hematological:  Negative for adenopathy. Does not bruise/bleed easily.  All other systems reviewed and are negative.       Observations/Objective: No vital signs or physical exam, this was a telephone or virtual health encounter.   Pt alert and oriented, answers all questions appropriately, and able to speak in full sentences.    Assessment and Plan: Giani was seen today for vaginal itching.  Diagnoses and all orders for this visit:  Vaginal irritation History of candidal vulvovaginitis History of vaginal candidiasis with antibiotic therapy.  Has been on antibiotics for acute sinusitis.  Patient now with vaginal pruritus and erythema.  No noted discharge or indications of STI as patient is not sexually active.  Will treat with Diflucan.  Patient aware to take 1 pill today and 1 pill at completion of antibiotic therapy.  Report any new, worsening, or persistent symptoms. -     fluconazole (DIFLUCAN) 150 MG tablet; 1 po q week x 4 weeks     Follow Up Instructions: Return if symptoms worsen or fail to improve.    I discussed the assessment and treatment plan with the patient. The patient was provided an opportunity to ask questions and all were answered. The patient agreed with the plan and demonstrated an understanding of the instructions.   The patient was advised to call back or seek an in-person evaluation if the symptoms worsen or if the condition fails to improve as anticipated.  The above assessment and management plan was discussed with the patient. The patient verbalized understanding of and has agreed to the management plan. Patient is aware to call the clinic if they develop any new symptoms or if symptoms persist or worsen. Patient is aware when to return to the clinic for a follow-up visit. Patient educated on when it is appropriate to go to the emergency department.    I provided 12 minutes of non-face-to-face time during this encounter. The call started at 1330. The call ended at 1340. The other time was used for coordination of care.    Kari Baars, FNP-C [facility] [address] [place], [place] [address] [phone] 09/17/2021

## 2021-10-01 ENCOUNTER — Other Ambulatory Visit: Payer: Self-pay | Admitting: Nurse Practitioner

## 2021-10-05 ENCOUNTER — Ambulatory Visit: Payer: Medicaid Other | Admitting: Nurse Practitioner

## 2021-10-05 ENCOUNTER — Encounter: Payer: Self-pay | Admitting: Nurse Practitioner

## 2021-10-05 DIAGNOSIS — J0101 Acute recurrent maxillary sinusitis: Secondary | ICD-10-CM

## 2021-10-05 MED ORDER — PREDNISONE 20 MG PO TABS: 40.0000 mg | ORAL_TABLET | Freq: Every day | ORAL | 0 refills | Status: AC

## 2021-10-05 MED ORDER — CEFDINIR 300 MG PO CAPS: 300.0000 mg | ORAL_CAPSULE | Freq: Two times a day (BID) | ORAL | 0 refills | Status: DC

## 2021-10-05 NOTE — Progress Notes (Signed)
Virtual Visit  Note Due to COVID-19 pandemic this visit was conducted virtually. This visit type was conducted due to national recommendations for restrictions regarding the COVID-19 Pandemic (e.g. social distancing, sheltering in place) in an effort to limit this patient's exposure and mitigate transmission in our community. All issues noted in this document were discussed and addressed.  A physical exam was not performed with this format.  I connected with Zafira Munos on 10/05/21 at 3:30 by telephone and verified that I am speaking with the correct person using two identifiers. Enis Leatherwood is currently located at home and no one is currently with her during visit. The provider, Mary-Margaret Daphine Deutscher, FNP is located in their office at time of visit.  I discussed the limitations, risks, security and privacy concerns of performing an evaluation and management service by telephone and the availability of in person appointments. I also discussed with the patient that there may be a patient responsible charge related to this service. The patient expressed understanding and agreed to proceed.   History and Present Illness:   Chief Complaint: cough and congestion  HPI Patient does telephone visit today for continued cough and congestion. She says she got some better after last antibiotics last month but symptoms have returned. She says she has a foul taste in her mouth and a constant foul smell n her nose. Her nasal discharge has turned from clear to green. She has remained on zytrec and flonase.     Review of Systems  Constitutional:  Negative for chills, fever and malaise/fatigue.  HENT:  Positive for congestion, ear pain and sinus pain. Negative for sore throat.   Respiratory:  Positive for cough and sputum production.   Musculoskeletal:  Negative for myalgias.  Neurological:  Negative for dizziness and headaches.    Observations/Objective: Alert and oriented- answers all questions  appropriately No distress   Assessment and Plan: Sue Fernicola in today with chief complaint of Cough   1. Acute recurrent maxillary sinusitis 1. Take meds as prescribed 2. Use a cool mist humidifier especially during the winter months and when heat has been humid. 3. Use saline nose sprays frequently 4. Saline irrigations of the nose can be very helpful if done frequently.  * 4X daily for 1 week*  * Use of a nettie pot can be helpful with this. Follow directions with this* 5. Drink plenty of fluids 6. Keep thermostat turn down low 7.For any cough or congestion  Use plain Mucinex- regular strength or max strength is fine   * Children- consult with Pharmacist for dosing 8. For fever or aces or pains- take tylenol or ibuprofen appropriate for age and weight.  * for fevers greater than 101 orally you may alternate ibuprofen and tylenol every  3 hours.   Meds ordered this encounter  Medications   cefdinir (OMNICEF) 300 MG capsule    Sig: Take 1 capsule (300 mg total) by mouth 2 (two) times daily. 1 po BID    Dispense:  20 capsule    Refill:  0    Order Specific Question:   Supervising Provider    Answer:   Arville Care A [[id number]]   predniSONE (DELTASONE) 20 MG tablet    Sig: Take 2 tablets (40 mg total) by mouth daily with breakfast for 5 days. 2 po daily for 5 days    Dispense:  10 tablet    Refill:  0    Order Specific Question:   Supervising Provider    Answer:  DETTINGER, JOSHUA A [[id number]]      Follow Up Instructions: prn    I discussed the assessment and treatment plan with the patient. The patient was provided an opportunity to ask questions and all were answered. The patient agreed with the plan and demonstrated an understanding of the instructions.   The patient was advised to call back or seek an in-person evaluation if the symptoms worsen or if the condition fails to improve as anticipated.  The above assessment and management plan was discussed with the  patient. The patient verbalized understanding of and has agreed to the management plan. Patient is aware to call the clinic if symptoms persist or worsen. Patient is aware when to return to the clinic for a follow-up visit. Patient educated on when it is appropriate to go to the emergency department.   Time call ended:  3:43  I provided 13 minutes of  non face-to-face time during this encounter.    Mary-Margaret Daphine Deutscher, FNP

## 2021-11-06 ENCOUNTER — Ambulatory Visit: Payer: Medicaid Other | Admitting: Nurse Practitioner

## 2021-11-06 ENCOUNTER — Encounter: Payer: Self-pay | Admitting: Nurse Practitioner

## 2021-11-06 DIAGNOSIS — J0101 Acute recurrent maxillary sinusitis: Secondary | ICD-10-CM | POA: Diagnosis not present

## 2021-11-06 MED ORDER — PROMETHAZINE-DM 6.25-15 MG/5ML PO SYRP: 5.0000 mL | ORAL_SOLUTION | Freq: Four times a day (QID) | ORAL | 0 refills | Status: DC | PRN

## 2021-11-06 MED ORDER — LEVOFLOXACIN 500 MG PO TABS: 500.0000 mg | ORAL_TABLET | Freq: Every day | ORAL | 0 refills | Status: DC

## 2021-11-06 NOTE — Patient Instructions (Signed)

## 2021-11-06 NOTE — Progress Notes (Signed)
Virtual Visit  Note Due to COVID-19 pandemic this visit was conducted virtually. This visit type was conducted due to national recommendations for restrictions regarding the COVID-19 Pandemic (e.g. social distancing, sheltering in place) in an effort to limit this patient's exposure and mitigate transmission in our community. All issues noted in this document were discussed and addressed.  A physical exam was not performed with this format.  I connected with Gabriella Woodhead on 11/06/21 at 11:08 by telephone and verified that I am speaking with the correct person using two identifiers. Natally Ribera is currently located at home and no one is currently with her during visit. The provider, Mary-Margaret Daphine Deutscher, FNP is located in their office at time of visit.  I discussed the limitations, risks, security and privacy concerns of performing an evaluation and management service by telephone and the availability of in person appointments. I also discussed with the patient that there may be a patient responsible charge related to this service. The patient expressed understanding and agreed to proceed.   History and Present Illness:  Patient has had cough and congestion intermittently for over 4 months. She has been treated with 3 different antibiotics. This last episode started on Monday. She has headache and lots of facial pressure. Cough and congestion.     Review of Systems  Constitutional:  Positive for fever (feels hot) and malaise/fatigue. Negative for chills.  HENT:  Positive for congestion, sinus pain and sore throat.   Respiratory:  Positive for cough and sputum production. Negative for shortness of breath.   Musculoskeletal:  Negative for myalgias.  Neurological:  Positive for headaches.    Observations/Objective: Alert and oriented- answers all questions appropriately No distress Raspy voice Deep cough  Assessment and Plan: Janiyha Montufar in today with chief complaint of No chief  complaint on file.   1. Acute recurrent maxillary sinusitis 1. Take meds as prescribed 2. Use a cool mist humidifier especially during the winter months and when heat has been humid. 3. Use saline nose sprays frequently 4. Saline irrigations of the nose can be very helpful if done frequently.  * 4X daily for 1 week*  * Use of a nettie pot can be helpful with this. Follow directions with this* 5. Drink plenty of fluids 6. Keep thermostat turn down low 7.For any cough or congestion- promethazine- dextromethorphan as prescribed 8. For fever or aces or pains- take tylenol or ibuprofen appropriate for age and weight.  * for fevers greater than 101 orally you may alternate ibuprofen and tylenol every  3 hours.   Referral to ENT - promethazine-dextromethorphan (PROMETHAZINE-DM) 6.25-15 MG/5ML syrup; Take 5 mLs by mouth 4 (four) times daily as needed for cough.  Dispense: 240 mL; Refill: 0 - levofloxacin (LEVAQUIN) 500 MG tablet; Take 1 tablet (500 mg total) by mouth daily.  Dispense: 7 tablet; Refill: 0    Follow Up Instructions: prn    I discussed the assessment and treatment plan with the patient. The patient was provided an opportunity to ask questions and all were answered. The patient agreed with the plan and demonstrated an understanding of the instructions.   The patient was advised to call back or seek an in-person evaluation if the symptoms worsen or if the condition fails to improve as anticipated.  The above assessment and management plan was discussed with the patient. The patient verbalized understanding of and has agreed to the management plan. Patient is aware to call the clinic if symptoms persist or worsen. Patient is  aware when to return to the clinic for a follow-up visit. Patient educated on when it is appropriate to go to the emergency department.   Time call ended:  1120  I provided 12 minutes of  non face-to-face time during this encounter.    Mary-Margaret  Daphine Deutscher, FNP

## 2021-12-14 ENCOUNTER — Other Ambulatory Visit: Payer: Self-pay | Admitting: Nurse Practitioner

## 2021-12-14 DIAGNOSIS — F3181 Bipolar II disorder: Secondary | ICD-10-CM

## 2021-12-14 DIAGNOSIS — K219 Gastro-esophageal reflux disease without esophagitis: Secondary | ICD-10-CM

## 2021-12-14 DIAGNOSIS — F410 Panic disorder [episodic paroxysmal anxiety] without agoraphobia: Secondary | ICD-10-CM

## 2021-12-31 ENCOUNTER — Ambulatory Visit: Payer: Medicaid Other | Admitting: Nurse Practitioner

## 2022-01-05 ENCOUNTER — Ambulatory Visit: Payer: Medicaid Other | Admitting: Nurse Practitioner

## 2022-01-07 ENCOUNTER — Ambulatory Visit: Payer: Medicaid Other | Admitting: Nurse Practitioner

## 2022-01-07 ENCOUNTER — Encounter: Payer: Self-pay | Admitting: Nurse Practitioner

## 2022-01-07 VITALS — BP 142/88 | HR 113 | Temp 97.2°F | Resp 20 | Ht 62.0 in | Wt 231.0 lb

## 2022-01-07 DIAGNOSIS — R1319 Other dysphagia: Secondary | ICD-10-CM | POA: Diagnosis not present

## 2022-01-07 DIAGNOSIS — F3181 Bipolar II disorder: Secondary | ICD-10-CM | POA: Diagnosis not present

## 2022-01-07 DIAGNOSIS — I10 Essential (primary) hypertension: Secondary | ICD-10-CM

## 2022-01-07 DIAGNOSIS — E785 Hyperlipidemia, unspecified: Secondary | ICD-10-CM | POA: Diagnosis not present

## 2022-01-07 DIAGNOSIS — K219 Gastro-esophageal reflux disease without esophagitis: Secondary | ICD-10-CM | POA: Diagnosis not present

## 2022-01-07 DIAGNOSIS — E032 Hypothyroidism due to medicaments and other exogenous substances: Secondary | ICD-10-CM

## 2022-01-07 DIAGNOSIS — F4001 Agoraphobia with panic disorder: Secondary | ICD-10-CM

## 2022-01-07 DIAGNOSIS — F410 Panic disorder [episodic paroxysmal anxiety] without agoraphobia: Secondary | ICD-10-CM | POA: Diagnosis not present

## 2022-01-07 DIAGNOSIS — Z23 Encounter for immunization: Secondary | ICD-10-CM | POA: Diagnosis not present

## 2022-01-07 DIAGNOSIS — N3941 Urge incontinence: Secondary | ICD-10-CM | POA: Diagnosis not present

## 2022-01-07 MED ORDER — LORATADINE 10 MG PO TABS: 10.0000 mg | ORAL_TABLET | Freq: Every day | ORAL | 1 refills | Status: DC

## 2022-01-07 MED ORDER — PANTOPRAZOLE SODIUM 40 MG PO TBEC: 40.0000 mg | DELAYED_RELEASE_TABLET | Freq: Every day | ORAL | 1 refills | Status: DC

## 2022-01-07 MED ORDER — LEVOTHYROXINE SODIUM 88 MCG PO TABS: ORAL_TABLET | ORAL | 1 refills | Status: DC

## 2022-01-07 MED ORDER — LAMOTRIGINE 200 MG PO TABS: 200.0000 mg | ORAL_TABLET | Freq: Every day | ORAL | 1 refills | Status: DC

## 2022-01-07 MED ORDER — HYDROCHLOROTHIAZIDE 25 MG PO TABS: 25.0000 mg | ORAL_TABLET | Freq: Every day | ORAL | 1 refills | Status: DC

## 2022-01-07 MED ORDER — HYDROXYZINE PAMOATE 25 MG PO CAPS
25.0000 mg | ORAL_CAPSULE | Freq: Three times a day (TID) | ORAL | 2 refills | Status: DC | PRN
Start: 2022-01-07 — End: 2022-07-13

## 2022-01-07 MED ORDER — LOSARTAN POTASSIUM 100 MG PO TABS: 100.0000 mg | ORAL_TABLET | Freq: Every day | ORAL | 1 refills | Status: DC

## 2022-01-07 MED ORDER — SERTRALINE HCL 100 MG PO TABS: ORAL_TABLET | ORAL | 1 refills | Status: DC

## 2022-01-07 NOTE — Patient Instructions (Signed)

## 2022-01-07 NOTE — Progress Notes (Signed)
Subjective:    Patient ID: Dawn Craig, female    DOB: February 23, 1971, 51 y.o.   MRN: [id number]   Chief Complaint: Medical Management of Chronic Issues    HPI:  Dawn Craig is a 51 y.o. who identifies as a female who was assigned female at birth.   Social history: Lives with: by herself- her sister checks on her several times a week. Work history: disability   Comes in today for follow up of the following chronic medical issues:  1. Essential hypertension, benign No c/o chest pain, sob or headache. Does not check blood pressure at home. BP Readings from Last 3 Encounters:  06/05/21 (!) 156/104  12/01/18 125/75  10/18/18 131/85     2. Hyperlipidemia with target LDL less than 100 Does not watch diet and does no exercise at all. Lab Results  Component Value Date   CHOL 193 07/03/2019   HDL 24 (L) 07/03/2019   LDLCALC 104 (H) 07/03/2019   TRIG 327 (H) 07/03/2019   CHOLHDL 8.0 (H) 07/03/2019     3. Gastroesophageal reflux disease, unspecified whether esophagitis present Is on protonix daily and is doing well.  4. Esophageal dysphagia No recent issues with swallowing  5. 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 Bipolar II disorder ([place]) Is on combination of lamictal and zoloft. She is  not doing well. Her son died last year and she has had a rough time. She very seldom goes out of her house. This is the first in office visit in over a year.  7. Panic disorder Has bad panic attacks when she goes out  public. GAD 7 : Generalized Anxiety Score 06/05/2021 01/18/2020 10/18/2019  Nervous, Anxious, on Edge '3 2 2  ' Control/stop worrying '3 2 2  ' Worry too much - different things '2 2 1  ' Trouble relaxing '2 1 1  ' Restless 1 0 1  Easily annoyed or irritable 2 0 2  Afraid - awful might happen 2 0 2  Total GAD 7 Score '15 7 11  ' Anxiety Difficulty Very difficult Not  difficult at all Somewhat difficult   Depression screen [facility] 2/9 06/05/2021 01/18/2020 10/18/2019  Decreased Interest '2 3 2  ' Down, Depressed, Hopeless '3 2 2  ' PHQ - 2 Score '5 5 4  ' Altered sleeping '3 3 2  ' Tired, decreased energy '2 2 2  ' Change in appetite '3 3 2  ' Feeling bad or failure about yourself  '2 2 2  ' Trouble concentrating '2 1 2  ' Moving slowly or fidgety/restless 2 0 1  Suicidal thoughts 3 0 0  PHQ-9 Score '22 16 15  ' Difficult doing work/chores Very difficult Somewhat difficult Somewhat difficult  Some encounter information is confidential and restricted. Go to Review Flowsheets activity to see all data.  Some recent data might be hidden      8. Agoraphobia with panic attacks  9. Urge incontinence of urine Has stopped her detrol LA.  10. Severe obesity (BMI >= 40) (HCC) Weight is down 7lbs since last visit Wt Readings from Last 3 Encounters:  01/07/22 231 lb (104.8 kg)  06/05/21 238 lb (108 kg)  12/01/18 260 lb 12.8 oz (118.3 kg)   BMI Readings from Last 3 Encounters:  01/07/22 42.25 kg/m  06/05/21 43.53 kg/m  12/01/18 47.70 kg/m      New complaints: None today  Allergies  Allergen Reactions   Ace Inhibitors  Cough   Famvir [Famciclovir] Hives and Swelling    Throat closes    Tetanus Toxoids     Swelling at sight   Outpatient Encounter Medications as of 01/07/2022  Medication Sig   azithromycin (ZITHROMAX Z-PAK) 250 MG tablet As directed   benzonatate (TESSALON PERLES) 100 MG capsule Take 1 capsule (100 mg total) by mouth 3 (three) times daily as needed.   betamethasone dipropionate (DIPROLENE) 0.05 % cream Apply topically 2 (two) times daily. (Patient not taking: Reported on 06/05/2021)   cefdinir (OMNICEF) 300 MG capsule Take 1 capsule (300 mg total) by mouth 2 (two) times daily. 1 po BID   cetirizine (ZYRTEC) 10 MG tablet TAKE 1 TABLET BY MOUTH EVERY DAY   Cholecalciferol (VITAMIN D) 2000 UNITS CAPS Take 2,000 Units by mouth daily.  (Patient not taking:  Reported on 06/05/2021)   fluconazole (DIFLUCAN) 150 MG tablet 1 po q week x 4 weeks   fluticasone (FLONASE) 50 MCG/ACT nasal spray INSTILL 1 SPRAY INTO EACH NOSTRIL 2 TIMES DAILY (Patient not taking: Reported on 06/05/2021)   hydrochlorothiazide (HYDRODIURIL) 25 MG tablet Take 1 tablet (25 mg total) by mouth daily.   lamoTRIgine (LAMICTAL) 200 MG tablet Take 1 tablet (200 mg total) by mouth daily.   levofloxacin (LEVAQUIN) 500 MG tablet Take 1 tablet (500 mg total) by mouth daily.   levothyroxine (SYNTHROID) 88 MCG tablet TAKE 1 TABLET BY MOUTH EVERY DAY   losartan (COZAAR) 100 MG tablet Take 1 tablet (100 mg total) by mouth daily.   medroxyPROGESTERone Acetate 150 MG/ML SUSY INJECT 1 ML INTO THE MUSCLE EVERY 3 MONTHS. (NEEDS TO BE SEEN BEFORE NEXT REFILL) (Patient not taking: Reported on 06/05/2021)   metoprolol succinate (TOPROL-XL) 50 MG 24 hr tablet Take with or immediately following a meal. (Patient not taking: Reported on 06/05/2021)   naproxen (NAPROSYN) 500 MG tablet TAKE 1 TABLET (500 MG TOTAL) BY MOUTH 2 (TWO) TIMES DAILY WITH A MEAL. (Patient not taking: Reported on 06/05/2021)   naproxen sodium (ALEVE) 220 MG tablet Take 220-440 mg by mouth 2 (two) times daily as needed (for pain.). (Patient not taking: Reported on 06/05/2021)   nystatin cream (MYCOSTATIN) Apply 1 application topically 2 (two) times daily.   pantoprazole (PROTONIX) 40 MG tablet TAKE 1 TABLET BY MOUTH EVERY DAY   promethazine (PHENERGAN) 25 MG tablet Take 0.5 tablets (12.5 mg total) by mouth every 6 (six) hours as needed for nausea or vomiting. (Patient not taking: Reported on 06/05/2021)   promethazine-dextromethorphan (PROMETHAZINE-DM) 6.25-15 MG/5ML syrup Take 5 mLs by mouth 4 (four) times daily as needed for cough.   sertraline (ZOLOFT) 100 MG tablet TAKE 2 TABLETS BY MOUTH EVERY DAY   tolterodine (DETROL LA) 4 MG 24 hr capsule Take 1 capsule (4 mg total) by mouth daily. (Needs to be seen before next refill)   triamcinolone cream  (KENALOG) 0.1 % Apply 1 application topically 2 (two) times daily. (Patient not taking: Reported on 06/05/2021)   trimethoprim-polymyxin b (POLYTRIM) ophthalmic solution Place 1 drop into both eyes every 4 (four) hours.   Facility-Administered Encounter Medications as of 01/07/2022  Medication   medroxyPROGESTERone (DEPO-PROVERA) injection 150 mg    Past Surgical History:  Procedure Laterality Date   CESAREAN SECTION      Family History  Problem Relation Age of Onset   Hypertension Mother    Cancer Mother        skin,face   Colon polyps Mother        had to  have surgery. Possible in 28s, early 46s    Hypertension Father    Hyperlipidemia Father    Depression Maternal Aunt    Bipolar disorder Cousin    Depression Other    Suicidality Other    Colon cancer Neg Hx       Controlled substance contract: n/a     Review of Systems  Constitutional:  Negative for diaphoresis.  Eyes:  Negative for pain.  Respiratory:  Negative for shortness of breath.   Cardiovascular:  Negative for chest pain, palpitations and leg swelling.  Gastrointestinal:  Negative for abdominal pain.  Endocrine: Negative for polydipsia.  Skin:  Negative for rash.  Neurological:  Negative for dizziness, weakness and headaches.  Hematological:  Does not bruise/bleed easily.  All other systems reviewed and are negative.     Objective:   Physical Exam Vitals and nursing note reviewed.  Constitutional:      General: She is not in acute distress.    Appearance: Normal appearance. She is well-developed.  HENT:     Head: Normocephalic.     Right Ear: Tympanic membrane normal.     Left Ear: Tympanic membrane normal.     Nose: Nose normal.     Mouth/Throat:     Mouth: Mucous membranes are moist.  Eyes:     Pupils: Pupils are equal, round, and reactive to light.  Neck:     Vascular: No carotid bruit or JVD.  Cardiovascular:     Rate and Rhythm: Normal rate and regular rhythm.     Heart sounds: Normal  heart sounds.  Pulmonary:     Effort: Pulmonary effort is normal. No respiratory distress.     Breath sounds: Normal breath sounds. No wheezing or rales.  Chest:     Chest wall: No tenderness.  Abdominal:     General: Bowel sounds are normal. There is no distension or abdominal bruit.     Palpations: Abdomen is soft. There is no hepatomegaly, splenomegaly, mass or pulsatile mass.     Tenderness: There is no abdominal tenderness.  Musculoskeletal:        General: Normal range of motion.     Cervical back: Normal range of motion and neck supple.  Lymphadenopathy:     Cervical: No cervical adenopathy.  Skin:    General: Skin is warm and dry.  Neurological:     Mental Status: She is alert and oriented to person, place, and time.     Deep Tendon Reflexes: Reflexes are normal and symmetric.  Psychiatric:        Behavior: Behavior normal.        Thought Content: Thought content normal.        Judgment: Judgment normal.    BP (!) 142/88    Pulse (!) 113    Temp (!) 97.2 F (36.2 C) (Temporal)    Resp 20    Ht '5\' 2"'  (1.575 m)    Wt 231 lb (104.8 kg)    SpO2 92%    BMI 42.25 kg/m        Assessment & Plan:  Dawn Craig comes in today with chief complaint of Medical Management of Chronic Issues   Diagnosis and orders addressed:  1. Essential hypertension, benign Low sodium diet - hydrochlorothiazide (HYDRODIURIL) 25 MG tablet; Take 1 tablet (25 mg total) by mouth daily.  Dispense: 90 tablet; Refill: 1 - losartan (COZAAR) 100 MG tablet; Take 1 tablet (100 mg total) by mouth daily.  Dispense: 90 tablet; Refill:  1 - CBC with Differential/Platelet - CMP14+EGFR  2. Hyperlipidemia with target LDL less than 100 Low fat diet - Lipid panel  3. Gastroesophageal reflux disease, unspecified whether esophagitis present Avoid spicy foods Do not eat 2 hours prior to bedtime  - pantoprazole (PROTONIX) 40 MG tablet; Take 1 tablet (40 mg total) by mouth daily.  Dispense: 90 tablet; Refill:  1  4. Esophageal dysphagia Chew food really well  5. 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 Bipolar II disorder (HCC) Stres management - lamoTRIgine (LAMICTAL) 200 MG tablet; Take 1 tablet (200 mg total) by mouth daily.  Dispense: 90 tablet; Refill: 1 - sertraline (ZOLOFT) 100 MG tablet; TAKE 2 TABLETS BY MOUTH EVERY DAY  Dispense: 180 tablet; Refill: 1  7. Panic disorder Add hydroxyzine - sertraline (ZOLOFT) 100 MG tablet; TAKE 2 TABLETS BY MOUTH EVERY DAY  Dispense: 180 tablet; Refill: 1 - hydrOXYzine (VISTARIL) 25 MG capsule; Take 1 capsule (25 mg total) by mouth every 8 (eight) hours as needed.  Dispense: 60 capsule; Refill: 2  8. Agoraphobia with panic attacks Again added hydroxyzine  9. Urge incontinence of urine  10. Severe obesity (BMI >= 40) (HCC) Discussed diet and exercise for person with BMI >25 Will recheck weight in 3-6 months    Labs pending Health Maintenance reviewed Diet and exercise encouraged  Follow up plan: 6 months   [place], FNP

## 2022-01-07 NOTE — Addendum Note (Signed)
Addended by: Cleda Daub on: 01/07/2022 04:43 PM   Modules accepted: Orders

## 2022-01-08 LAB — THYROID PANEL WITH TSH
Free Thyroxine Index: 2.3 (ref 1.2–4.9)
T3 Uptake Ratio: 23 % — ABNORMAL LOW (ref 24–39)
T4, Total: 9.9 ug/dL (ref 4.5–12.0)
TSH: 4.09 u[IU]/mL (ref 0.450–4.500)

## 2022-01-08 LAB — CBC WITH DIFFERENTIAL/PLATELET
Basophils Absolute: 0.1 10*3/uL (ref 0.0–0.2)
Basos: 1 %
EOS (ABSOLUTE): 0.2 10*3/uL (ref 0.0–0.4)
Eos: 2 %
Hematocrit: 46.2 % (ref 34.0–46.6)
Hemoglobin: 15.9 g/dL (ref 11.1–15.9)
Immature Grans (Abs): 0 10*3/uL (ref 0.0–0.1)
Immature Granulocytes: 0 %
Lymphocytes Absolute: 6 10*3/uL — ABNORMAL HIGH (ref 0.7–3.1)
Lymphs: 43 %
MCH: 31.5 pg (ref 26.6–33.0)
MCHC: 34.4 g/dL (ref 31.5–35.7)
MCV: 92 fL (ref 79–97)
Monocytes Absolute: 0.8 10*3/uL (ref 0.1–0.9)
Monocytes: 6 %
Neutrophils Absolute: 6.6 10*3/uL (ref 1.4–7.0)
Neutrophils: 48 %
Platelets: 346 10*3/uL (ref 150–450)
RBC: 5.04 x10E6/uL (ref 3.77–5.28)
RDW: 12.4 % (ref 11.7–15.4)
WBC: 13.7 10*3/uL — ABNORMAL HIGH (ref 3.4–10.8)

## 2022-01-08 LAB — CMP14+EGFR
ALT: 36 IU/L — ABNORMAL HIGH (ref 0–32)
AST: 44 IU/L — ABNORMAL HIGH (ref 0–40)
Albumin/Globulin Ratio: 1.9 (ref 1.2–2.2)
Albumin: 4.8 g/dL (ref 3.8–4.8)
Alkaline Phosphatase: 95 IU/L (ref 44–121)
BUN/Creatinine Ratio: 9 (ref 9–23)
BUN: 8 mg/dL (ref 6–24)
Bilirubin Total: 0.7 mg/dL (ref 0.0–1.2)
CO2: 25 mmol/L (ref 20–29)
Calcium: 10 mg/dL (ref 8.7–10.2)
Chloride: 95 mmol/L — ABNORMAL LOW (ref 96–106)
Creatinine, Ser: 0.87 mg/dL (ref 0.57–1.00)
Globulin, Total: 2.5 g/dL (ref 1.5–4.5)
Glucose: 208 mg/dL — ABNORMAL HIGH (ref 70–99)
Potassium: 3.7 mmol/L (ref 3.5–5.2)
Sodium: 139 mmol/L (ref 134–144)
Total Protein: 7.3 g/dL (ref 6.0–8.5)
eGFR: 81 mL/min/{1.73_m2} (ref >59)

## 2022-01-08 LAB — LIPID PANEL
Chol/HDL Ratio: 7.6 ratio — ABNORMAL HIGH (ref 0.0–4.4)
Cholesterol, Total: 198 mg/dL (ref 100–199)
HDL: 26 mg/dL — ABNORMAL LOW (ref >39)
LDL Chol Calc (NIH): 107 mg/dL — ABNORMAL HIGH (ref 0–99)
Triglycerides: 376 mg/dL — ABNORMAL HIGH (ref 0–149)
VLDL Cholesterol Cal: 65 mg/dL — ABNORMAL HIGH (ref 5–40)

## 2022-03-04 ENCOUNTER — Encounter: Payer: Self-pay | Admitting: *Deleted

## 2022-07-07 ENCOUNTER — Ambulatory Visit: Payer: Medicaid Other | Admitting: Nurse Practitioner

## 2022-07-08 ENCOUNTER — Ambulatory Visit: Payer: Medicaid Other | Admitting: Nurse Practitioner

## 2022-07-08 NOTE — Progress Notes (Deleted)
Subjective:    Patient ID: Dawn Craig, female    DOB: 06-03-1971, 51 y.o.   MRN: [id number]   Chief Complaint: medical management of chronic issues     HPI:  Dawn Craig is a 51 y.o. who identifies as a female who was assigned female at birth.   Social history: Lives with: by herself Work history: disability   Comes in today for follow up of the following chronic medical issues:  1. Essential hypertension, benign No c/o chest pain, sob or headache. Does not check blood pressure at home. BP Readings from Last 3 Encounters:  01/07/22 (!) 142/88  06/05/21 (!) 156/104  12/01/18 125/75    2. Hyperlipidemia with target LDL less than 100 Does not watch diet and does no exercise at all. Lab Results  Component Value Date   CHOL 198 01/07/2022   HDL 26 (L) 01/07/2022   LDLCALC 107 (H) 01/07/2022   TRIG 376 (H) 01/07/2022   CHOLHDL 7.6 (H) 01/07/2022     3. Gastroesophageal reflux disease, unspecified whether esophagitis present Is on protonix daily and is doing well.  4. Esophageal dysphagia ***  5. 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 Bipolar II disorder (HCC) 7. Panic disorder 8. Agoraphobia with panic attacks Patient is on lamictal and zoloft . She really struggles with her anxiety and depression. She very sledom goes out of her house beause crowds cause panic attacks. Her brother in law goes and gets her allof her groceries and household supplies.   9. Urge incontinence of urine ***  10. Severe obesity (BMI >= 40) (HCC) No recent weight changes   New complaints: ***  Allergies  Allergen Reactions   Ace Inhibitors Cough   Famvir [Famciclovir] Hives and Swelling    Throat closes    Tetanus Toxoids     Swelling at sight    Outpatient Encounter Medications as of 07/08/2022  Medication Sig   betamethasone dipropionate (DIPROLENE) 0.05 % cream Apply topically 2 (two) times daily.   cetirizine (ZYRTEC) 10 MG tablet TAKE 1 TABLET BY MOUTH EVERY DAY (Patient not taking: Reported on 01/07/2022)   Cholecalciferol (VITAMIN D) 2000 UNITS CAPS Take 2,000 Units by mouth daily.   fluticasone (FLONASE) 50 MCG/ACT nasal spray INSTILL 1 SPRAY INTO EACH NOSTRIL 2 TIMES DAILY   hydrochlorothiazide (HYDRODIURIL) 25 MG tablet Take 1 tablet (25 mg total) by mouth daily.   hydrOXYzine (VISTARIL) 25 MG capsule Take 1 capsule (25 mg total) by mouth every 8 (eight) hours as needed.   lamoTRIgine (LAMICTAL) 200 MG tablet Take 1 tablet (200 mg total) by mouth daily.   levothyroxine (SYNTHROID) 88 MCG tablet TAKE 1 TABLET BY MOUTH EVERY DAY   loratadine (CLARITIN) 10 MG tablet Take 1 tablet (10 mg total) by mouth daily.   losartan (COZAAR) 100 MG tablet Take 1 tablet (100 mg total) by mouth daily.   naproxen (NAPROSYN) 500 MG tablet TAKE 1 TABLET (500 MG TOTAL) BY MOUTH 2 (TWO) TIMES DAILY WITH A MEAL.   nystatin cream (MYCOSTATIN) Apply 1 application topically 2 (two) times daily.   pantoprazole (PROTONIX) 40 MG tablet Take 1 tablet (40 mg total) by mouth daily.   sertraline (ZOLOFT) 100 MG tablet TAKE 2 TABLETS BY MOUTH  EVERY DAY   triamcinolone cream (KENALOG) 0.1 % Apply 1 application topically 2 (two) times daily.   Facility-Administered Encounter Medications as of 07/08/2022  Medication   medroxyPROGESTERone (DEPO-PROVERA) injection 150 mg    Past Surgical History:  Procedure Laterality Date   CESAREAN SECTION      Family History  Problem Relation Age of Onset   Hypertension Mother    Cancer Mother        skin,face   Colon polyps Mother        had to have surgery. Possible in 25s, early 30s    Hypertension Father    Hyperlipidemia Father    Depression Maternal Aunt    Bipolar disorder Cousin    Depression Other     Suicidality Other    Colon cancer Neg Hx       Controlled substance contract: ***     Review of Systems     Objective:   Physical Exam        Assessment & Plan:

## 2022-07-09 NOTE — Patient Instructions (Signed)
Our records indicate that you are due for your annual mammogram/breast imaging. While there is no way to prevent breast cancer, early detection provides the best opportunity for curing it. For women over the age of 40, the American Cancer Society recommends a yearly clinical breast exam and a yearly mammogram. These practices have saved thousands of lives. We need your help to ensure that you are receiving optimal medical care. Please call the imaging location that has done you previous mammograms. Please remember to list us as your primary care. This helps make sure we receive a report and can update your chart.  Below is the contact information for several local breast imaging centers. You may call the location that works best for you, and they will be happy to assistance in making you an appointment. You do not need an order for a regular screening mammogram. However, if you are having any problems or concerns with you breast area, please let your primary care provider know, and appropriate orders will be placed. Please let our office know if you have any questions or concerns. Or if you need information for another imaging center not on this list or outside of the area. We are commented to working with you on your health care journey.   The mobile unit/bus ([facility]) - they come twice a month to our location.  These appointments can be made through our office or by call The [facility] Center  [facility]  [address] [place], [place] [address] Phone [phone]   [facility]ospital Radiology Department  [address]  [place], [place] [address] [phone]  [facility])  [address] [place], [place] [address] [phone]  [facility] [place]  [address] [place] [address] [phone]  [facility]  [address], [address] [place] [address] [phone]  [facility] [place]  [address] [place], [place] [address] [phone]  [facility] [address] [place], [place] [address] [phone]  [facility] [address]  [address] [place], [place] [address] [phone]  [facility] [address] [place] [address] [phone]     

## 2022-07-13 ENCOUNTER — Encounter: Payer: Self-pay | Admitting: Nurse Practitioner

## 2022-07-13 ENCOUNTER — Ambulatory Visit: Payer: Medicaid Other | Admitting: Nurse Practitioner

## 2022-07-13 VITALS — BP 135/86 | HR 127 | Temp 96.4°F | Resp 20 | Ht 62.0 in | Wt 230.0 lb

## 2022-07-13 DIAGNOSIS — E032 Hypothyroidism due to medicaments and other exogenous substances: Secondary | ICD-10-CM

## 2022-07-13 DIAGNOSIS — F4001 Agoraphobia with panic disorder: Secondary | ICD-10-CM | POA: Diagnosis not present

## 2022-07-13 DIAGNOSIS — E785 Hyperlipidemia, unspecified: Secondary | ICD-10-CM

## 2022-07-13 DIAGNOSIS — G43711 Chronic migraine without aura, intractable, with status migrainosus: Secondary | ICD-10-CM | POA: Diagnosis not present

## 2022-07-13 DIAGNOSIS — F410 Panic disorder [episodic paroxysmal anxiety] without agoraphobia: Secondary | ICD-10-CM

## 2022-07-13 DIAGNOSIS — N3941 Urge incontinence: Secondary | ICD-10-CM

## 2022-07-13 DIAGNOSIS — R42 Dizziness and giddiness: Secondary | ICD-10-CM | POA: Diagnosis not present

## 2022-07-13 DIAGNOSIS — F3181 Bipolar II disorder: Secondary | ICD-10-CM | POA: Diagnosis not present

## 2022-07-13 DIAGNOSIS — Z23 Encounter for immunization: Secondary | ICD-10-CM | POA: Diagnosis not present

## 2022-07-13 DIAGNOSIS — R1319 Other dysphagia: Secondary | ICD-10-CM

## 2022-07-13 DIAGNOSIS — K219 Gastro-esophageal reflux disease without esophagitis: Secondary | ICD-10-CM

## 2022-07-13 DIAGNOSIS — I10 Essential (primary) hypertension: Secondary | ICD-10-CM

## 2022-07-13 MED ORDER — HYDROCHLOROTHIAZIDE 25 MG PO TABS: 25.0000 mg | ORAL_TABLET | Freq: Every day | ORAL | 1 refills | Status: DC

## 2022-07-13 MED ORDER — PANTOPRAZOLE SODIUM 40 MG PO TBEC: 40.0000 mg | DELAYED_RELEASE_TABLET | Freq: Every day | ORAL | 1 refills | Status: DC

## 2022-07-13 MED ORDER — LAMOTRIGINE 200 MG PO TABS: 200.0000 mg | ORAL_TABLET | Freq: Every day | ORAL | 1 refills | Status: DC

## 2022-07-13 MED ORDER — LEVOTHYROXINE SODIUM 88 MCG PO TABS: ORAL_TABLET | ORAL | 1 refills | Status: DC

## 2022-07-13 MED ORDER — HYDROXYZINE PAMOATE 25 MG PO CAPS: 25.0000 mg | ORAL_CAPSULE | Freq: Three times a day (TID) | ORAL | 2 refills | Status: DC | PRN

## 2022-07-13 MED ORDER — ONDANSETRON HCL 4 MG PO TABS: 4.0000 mg | ORAL_TABLET | Freq: Three times a day (TID) | ORAL | 1 refills | Status: DC | PRN

## 2022-07-13 MED ORDER — SERTRALINE HCL 100 MG PO TABS: ORAL_TABLET | ORAL | 1 refills | Status: DC

## 2022-07-13 MED ORDER — MECLIZINE HCL 25 MG PO TABS: 25.0000 mg | ORAL_TABLET | Freq: Three times a day (TID) | ORAL | 3 refills | Status: DC | PRN

## 2022-07-13 MED ORDER — LOSARTAN POTASSIUM 100 MG PO TABS: 100.0000 mg | ORAL_TABLET | Freq: Every day | ORAL | 1 refills | Status: DC

## 2022-07-13 NOTE — Addendum Note (Signed)
Addended by: Cleda Daub on: 07/13/2022 02:43 PM   Modules accepted: Orders

## 2022-07-13 NOTE — Progress Notes (Signed)
Subjective:    Patient ID: Dawn Craig, female    DOB: [birth year], 51 y.o.   MRN: [id number]   Chief Complaint: medical management of chronic issues     HPI:  Dawn Craig is a 51 y.o. who identifies as a female who was assigned female at birth.   Social history: Lives with: by herself Work history: disability   Comes in today for follow up of the following chronic medical issues:  1. Essential hypertension, benign No c/o chest pain, sob or headche. Doe snot check blood apressure at home. BP Readings from Last 3 Encounters:  01/07/22 (!) 142/88  06/05/21 (!) 156/104  12/01/18 125/75     2. Intractable chronic migraine without aura and with status migrainosus Had  one yesterday , but first one she has had in awhile  3. Esophageal dysphagia Denies any swallowing issues as of late.  4. Gastroesophageal reflux disease, unspecified whether esophagitis present Is on protonix daily and works well for her  5. 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 Lab Results  Component Value Date   TSH 4.090 01/07/2022     6. Hyperlipidemia with target LDL less than 100 Does not watch diet and does no dedicated exercise. Lab Results  Component Value Date   CHOL 198 01/07/2022   HDL 26 (L) 01/07/2022   LDLCALC 107 (H) 01/07/2022   TRIG 376 (H) 01/07/2022   CHOLHDL 7.6 (H) 01/07/2022     7. Urge incontinence of urine Just usually will wear a pad  8. Bipolar II disorder ([place]) 9. Panic disorder 10. Agoraphobia with panic attacks Patient has lots of anxiety issues. She very seldom goes out of her house other than to come for doctors visits. She is on lamictal and Zoloft. She has vistaril to take on as needed basis. Her son died over a year ago and that has seemed to make her a little worse.[facility] [place]    07/13/2022   11:25 AM 06/05/2021   11:54 AM 03/16/2021    1:45 PM  Depression screen PHQ  2/9  Decreased Interest 2 2   Down, Depressed, Hopeless 3 3   PHQ - 2 Score 5 5   Altered sleeping 2 3   Tired, decreased energy 2 2   Change in appetite 2 3   Feeling bad or failure about yourself  3 2   Trouble concentrating 3 2   Moving slowly or fidgety/restless 2 2   Suicidal thoughts 2 3   PHQ-9 Score 21 22   Difficult doing work/chores Extremely dIfficult Very difficult      Information is confidential and restricted. Go to Review Flowsheets to unlock data.     11. Severe obesity (BMI >= 40) (HCC) No recent weight changes Wt Readings from Last 3 Encounters:  07/13/22 230 lb (104.3 kg)  01/07/22 231 lb (104.8 kg)  06/05/21 238 lb (108 kg)   BMI Readings from Last 3 Encounters:  07/13/22 42.07 kg/m  01/07/22 42.25 kg/m  06/05/21 43.53 kg/m     New complaints: - hot flashes- has daily for hours. LMP was 11/2021. - vertigo- had for 3 weeks about 1 month ago- has had a few spells in the last week. Would like some meclizine to have on hand - milia on face- has started in the last few months. Allergies  Allergen Reactions   Ace Inhibitors Cough   Famvir [Famciclovir] Hives and Swelling  Throat closes    Tetanus Toxoids     Swelling at sight   Outpatient Encounter Medications as of 07/13/2022  Medication Sig   betamethasone dipropionate (DIPROLENE) 0.05 % cream Apply topically 2 (two) times daily.   cetirizine (ZYRTEC) 10 MG tablet TAKE 1 TABLET BY MOUTH EVERY DAY (Patient not taking: Reported on 01/07/2022)   Cholecalciferol (VITAMIN D) 2000 UNITS CAPS Take 2,000 Units by mouth daily.   fluticasone (FLONASE) 50 MCG/ACT nasal spray INSTILL 1 SPRAY INTO EACH NOSTRIL 2 TIMES DAILY   hydrochlorothiazide (HYDRODIURIL) 25 MG tablet Take 1 tablet (25 mg total) by mouth daily.   hydrOXYzine (VISTARIL) 25 MG capsule Take 1 capsule (25 mg total) by mouth every 8 (eight) hours as needed.   lamoTRIgine (LAMICTAL) 200 MG tablet Take 1 tablet (200 mg total) by mouth daily.    levothyroxine (SYNTHROID) 88 MCG tablet TAKE 1 TABLET BY MOUTH EVERY DAY   loratadine (CLARITIN) 10 MG tablet Take 1 tablet (10 mg total) by mouth daily.   losartan (COZAAR) 100 MG tablet Take 1 tablet (100 mg total) by mouth daily.   naproxen (NAPROSYN) 500 MG tablet TAKE 1 TABLET (500 MG TOTAL) BY MOUTH 2 (TWO) TIMES DAILY WITH A MEAL.   nystatin cream (MYCOSTATIN) Apply 1 application topically 2 (two) times daily.   pantoprazole (PROTONIX) 40 MG tablet Take 1 tablet (40 mg total) by mouth daily.   sertraline (ZOLOFT) 100 MG tablet TAKE 2 TABLETS BY MOUTH EVERY DAY   triamcinolone cream (KENALOG) 0.1 % Apply 1 application topically 2 (two) times daily.   Facility-Administered Encounter Medications as of 07/13/2022  Medication   medroxyPROGESTERone (DEPO-PROVERA) injection 150 mg    Past Surgical History:  Procedure Laterality Date   CESAREAN SECTION      Family History  Problem Relation Age of Onset   Hypertension Mother    Cancer Mother        skin,face   Colon polyps Mother        had to have surgery. Possible in 4s, early 51s    Hypertension Father    Hyperlipidemia Father    Depression Maternal Aunt    Bipolar disorder Cousin    Depression Other    Suicidality Other    Colon cancer Neg Hx       Controlled substance contract: n/a      Review of Systems  Constitutional:  Negative for diaphoresis.  Eyes:  Negative for pain.  Respiratory:  Negative for shortness of breath.   Cardiovascular:  Negative for chest pain, palpitations and leg swelling.  Gastrointestinal:  Negative for abdominal pain.  Endocrine: Negative for polydipsia.  Skin:  Negative for rash.  Neurological:  Negative for dizziness, weakness and headaches.  Hematological:  Does not bruise/bleed easily.  Psychiatric/Behavioral:  Positive for decreased concentration and dysphoric mood. The patient is nervous/anxious.   All other systems reviewed and are negative.      Objective:   Physical  Exam Vitals and nursing note reviewed.  Constitutional:      General: She is not in acute distress.    Appearance: Normal appearance. She is well-developed.  HENT:     Head: Normocephalic.     Right Ear: Tympanic membrane normal.     Left Ear: Tympanic membrane normal.     Nose: Nose normal.     Mouth/Throat:     Mouth: Mucous membranes are moist.     Comments: multiple dental caries Eyes:     Pupils: Pupils are  equal, round, and reactive to light.  Neck:     Vascular: No carotid bruit or JVD.  Cardiovascular:     Rate and Rhythm: Normal rate and regular rhythm.     Heart sounds: Normal heart sounds.  Pulmonary:     Effort: Pulmonary effort is normal. No respiratory distress.     Breath sounds: Normal breath sounds. No wheezing or rales.  Chest:     Chest wall: No tenderness.  Abdominal:     General: Bowel sounds are normal. There is no distension or abdominal bruit.     Palpations: Abdomen is soft. There is no hepatomegaly, splenomegaly, mass or pulsatile mass.     Tenderness: There is no abdominal tenderness.  Musculoskeletal:        General: Normal range of motion.     Cervical back: Normal range of motion and neck supple.  Lymphadenopathy:     Cervical: No cervical adenopathy.  Skin:    General: Skin is warm and dry.     Comments: Small flesh colored lesions all over face.  Neurological:     Mental Status: She is alert and oriented to person, place, and time.     Deep Tendon Reflexes: Reflexes are normal and symmetric.  Psychiatric:        Behavior: Behavior normal.        Thought Content: Thought content normal.        Judgment: Judgment normal.    BP 135/86   Pulse (!) 127   Temp (!) 96.4 F (35.8 C) (Temporal)   Resp 20   Ht '5\' 2"'  (1.575 m)   Wt 230 lb (104.3 kg)   SpO2 96%   BMI 42.07 kg/m         Assessment & Plan:  Dawn Craig comes in today with chief complaint of Medical Management of Chronic Issues   Diagnosis and orders addressed:  1.  Essential hypertension, benign Low sodium diet - hydrochlorothiazide (HYDRODIURIL) 25 MG tablet; Take 1 tablet (25 mg total) by mouth daily.  Dispense: 90 tablet; Refill: 1 - losartan (COZAAR) 100 MG tablet; Take 1 tablet (100 mg total) by mouth daily.  Dispense: 90 tablet; Refill: 1 - CBC with Differential/Platelet - CMP14+EGFR  2. Intractable chronic migraine without aura and with status migrainosus Avoid caffeine  3. Esophageal dysphagia Chew food well  4. Gastroesophageal reflux disease, unspecified whether esophagitis present Avoid spicy foods Do not eat 2 hours prior to bedtime  - pantoprazole (PROTONIX) 40 MG tablet; Take 1 tablet (40 mg total) by mouth daily.  Dispense: 90 tablet; Refill: 1  5. 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 Hyperlipidemia with target LDL less than 100 Low fat diet - Lipid panel  7. Urge incontinence of urine - Thyroid Panel With TSH  8. Bipolar II disorder ([place]) - Ambulatory referral to Psychiatry - lamoTRIgine (LAMICTAL) 200 MG tablet; Take 1 tablet (200 mg total) by mouth daily.  Dispense: 90 tablet; Refill: 1 - sertraline (ZOLOFT) 100 MG tablet; TAKE 2 TABLETS BY MOUTH EVERY DAY  Dispense: 180 tablet; Refill: 1  9. Panic disorder - Ambulatory referral to Psychiatry - sertraline (ZOLOFT) 100 MG tablet; TAKE 2 TABLETS BY MOUTH EVERY DAY  Dispense: 180 tablet; Refill: 1 - hydrOXYzine (VISTARIL) 25 MG capsule; Take 1 capsule (25 mg total) by mouth every 8 (eight)  hours as needed.  Dispense: 60 capsule; Refill: 2  10. Agoraphobia with panic attacks - Ambulatory referral to Psychiatry  11. Severe obesity (BMI >= 40) (HCC) Discussed diet and exercise for person with BMI >25 Will recheck weight in 3-6 months  12. Vertigo Force fluids - ondansetron (ZOFRAN) 4 MG tablet;  Take 1 tablet (4 mg total) by mouth every 8 (eight) hours as needed for nausea or vomiting.  Dispense: 20 tablet; Refill: 1 - meclizine (ANTIVERT) 25 MG tablet; Take 1 tablet (25 mg total) by mouth 3 (three) times daily as needed for dizziness.  Dispense: 30 tablet; Refill: 3   Labs pending Health Maintenance reviewed Diet and exercise encouraged  Follow up plan: 6 months   Mary-Margaret Hassell Done, FNP

## 2022-07-14 LAB — CMP14+EGFR
ALT: 39 IU/L — ABNORMAL HIGH (ref 0–32)
AST: 60 IU/L — ABNORMAL HIGH (ref 0–40)
Albumin/Globulin Ratio: 2 (ref 1.2–2.2)
Albumin: 5.1 g/dL — ABNORMAL HIGH (ref 3.9–4.9)
Alkaline Phosphatase: 124 IU/L — ABNORMAL HIGH (ref 44–121)
BUN/Creatinine Ratio: 9 (ref 9–23)
BUN: 9 mg/dL (ref 6–24)
Bilirubin Total: 0.9 mg/dL (ref 0.0–1.2)
CO2: 24 mmol/L (ref 20–29)
Calcium: 10.5 mg/dL — ABNORMAL HIGH (ref 8.7–10.2)
Chloride: 95 mmol/L — ABNORMAL LOW (ref 96–106)
Creatinine, Ser: 0.99 mg/dL (ref 0.57–1.00)
Globulin, Total: 2.5 g/dL (ref 1.5–4.5)
Glucose: 320 mg/dL — ABNORMAL HIGH (ref 70–99)
Potassium: 4 mmol/L (ref 3.5–5.2)
Sodium: 137 mmol/L (ref 134–144)
Total Protein: 7.6 g/dL (ref 6.0–8.5)
eGFR: 69 mL/min/{1.73_m2} (ref >59)

## 2022-07-14 LAB — CBC WITH DIFFERENTIAL/PLATELET
Basophils Absolute: 0.1 10*3/uL (ref 0.0–0.2)
Basos: 1 %
EOS (ABSOLUTE): 0.2 10*3/uL (ref 0.0–0.4)
Eos: 1 %
Hematocrit: 48.2 % — ABNORMAL HIGH (ref 34.0–46.6)
Hemoglobin: 16.3 g/dL — ABNORMAL HIGH (ref 11.1–15.9)
Immature Grans (Abs): 0.1 10*3/uL (ref 0.0–0.1)
Immature Granulocytes: 1 %
Lymphocytes Absolute: 4.6 10*3/uL — ABNORMAL HIGH (ref 0.7–3.1)
Lymphs: 35 %
MCH: 31.5 pg (ref 26.6–33.0)
MCHC: 33.8 g/dL (ref 31.5–35.7)
MCV: 93 fL (ref 79–97)
Monocytes Absolute: 0.9 10*3/uL (ref 0.1–0.9)
Monocytes: 7 %
Neutrophils Absolute: 7.4 10*3/uL — ABNORMAL HIGH (ref 1.4–7.0)
Neutrophils: 55 %
Platelets: 355 10*3/uL (ref 150–450)
RBC: 5.18 x10E6/uL (ref 3.77–5.28)
RDW: 12.7 % (ref 11.7–15.4)
WBC: 13.1 10*3/uL — ABNORMAL HIGH (ref 3.4–10.8)

## 2022-07-14 LAB — THYROID PANEL WITH TSH
Free Thyroxine Index: 2.9 (ref 1.2–4.9)
T3 Uptake Ratio: 27 % (ref 24–39)
T4, Total: 10.8 ug/dL (ref 4.5–12.0)
TSH: 3.8 u[IU]/mL (ref 0.450–4.500)

## 2022-07-14 LAB — LIPID PANEL
Chol/HDL Ratio: 9.3 ratio — ABNORMAL HIGH (ref 0.0–4.4)
Cholesterol, Total: 259 mg/dL — ABNORMAL HIGH (ref 100–199)
HDL: 28 mg/dL — ABNORMAL LOW (ref >39)
LDL Chol Calc (NIH): 147 mg/dL — ABNORMAL HIGH (ref 0–99)
Triglycerides: 445 mg/dL — ABNORMAL HIGH (ref 0–149)
VLDL Cholesterol Cal: 84 mg/dL — ABNORMAL HIGH (ref 5–40)

## 2022-07-16 ENCOUNTER — Encounter: Payer: Self-pay | Admitting: Family Medicine

## 2022-07-16 ENCOUNTER — Telehealth: Payer: Self-pay | Admitting: Family Medicine

## 2022-07-16 ENCOUNTER — Ambulatory Visit: Payer: Medicaid Other | Admitting: Family Medicine

## 2022-07-16 DIAGNOSIS — Z8619 Personal history of other infectious and parasitic diseases: Secondary | ICD-10-CM | POA: Diagnosis not present

## 2022-07-16 DIAGNOSIS — R350 Frequency of micturition: Secondary | ICD-10-CM | POA: Diagnosis not present

## 2022-07-16 DIAGNOSIS — R3989 Other symptoms and signs involving the genitourinary system: Secondary | ICD-10-CM

## 2022-07-16 DIAGNOSIS — R300 Dysuria: Secondary | ICD-10-CM

## 2022-07-16 MED ORDER — SULFAMETHOXAZOLE-TRIMETHOPRIM 800-160 MG PO TABS: 1.0000 | ORAL_TABLET | Freq: Two times a day (BID) | ORAL | 0 refills | Status: AC

## 2022-07-16 MED ORDER — FLUCONAZOLE 150 MG PO TABS: 150.0000 mg | ORAL_TABLET | Freq: Once | ORAL | 0 refills | Status: AC

## 2022-07-16 NOTE — Progress Notes (Signed)
Virtual Visit via telephone Note Due to COVID-19 pandemic this visit was conducted virtually. This visit type was conducted due to national recommendations for restrictions regarding the COVID-19 Pandemic (e.g. social distancing, sheltering in place) in an effort to limit this patient's exposure and mitigate transmission in our community. All issues noted in this document were discussed and addressed.  A physical exam was not performed with this format.   I connected with Dawn Craig on 07/16/2022 at 1357 by telephone and verified that I am speaking with the correct person using two identifiers. Dawn Craig is currently located at home and patient is currently with them during visit. The provider, Kari Baars, FNP is located in their office at time of visit.  I discussed the limitations, risks, security and privacy concerns of performing an evaluation and management service by virtual visit and the availability of in person appointments. I also discussed with the patient that there may be a patient responsible charge related to this service. The patient expressed understanding and agreed to proceed.  Subjective:  Patient ID: Dawn Craig, female    DOB: 1971-10-26, 51 y.o.   MRN: [id number]  Chief Complaint:  Dysuria   HPI: Dawn Craig is a 51 y.o. female presenting on 07/16/2022 for Dysuria   Dysuria  This is a new problem. The current episode started in the past 7 days. The problem occurs every urination. The problem has been gradually worsening. The quality of the pain is described as burning and aching. The pain is moderate. There has been no fever. She is Not sexually active. There is No history of pyelonephritis. Associated symptoms include frequency, hesitancy and urgency. Pertinent negatives include no chills, discharge, flank pain, hematuria, nausea, possible pregnancy, sweats or vomiting. She has tried increased fluids for the symptoms. The treatment provided no relief.      Relevant past medical, surgical, family, and social history reviewed and updated as indicated.  Allergies and medications reviewed and updated.   Past Medical History:  Diagnosis Date   Anxiety    Bipolar 1 disorder (HCC)    GERD (gastroesophageal reflux disease)    H/O sinus surgery    Hyperlipidemia    Hypertension    Thyroid disease     Past Surgical History:  Procedure Laterality Date   CESAREAN SECTION      Social History   Socioeconomic History   Marital status: Divorced    Spouse name: Not on file   Number of children: 1   Years of education: Not on file   Highest education level: Not on file  Occupational History   Not on file  Tobacco Use   Smoking status: Never   Smokeless tobacco: Never  Substance and Sexual Activity   Alcohol use: No   Drug use: No   Sexual activity: Not on file  Other Topics Concern   Not on file  Social History Narrative   51 year old female, 25 in May 2020.    Social Determinants of Health   Financial Resource Strain: Not on file  Food Insecurity: Not on file  Transportation Needs: Not on file  Physical Activity: Not on file  Stress: Not on file  Social Connections: Not on file  Intimate Partner Violence: Not on file    Outpatient Encounter Medications as of 07/16/2022  Medication Sig   sulfamethoxazole-trimethoprim (BACTRIM DS) 800-160 MG tablet Take 1 tablet by mouth 2 (two) times daily for 7 days.   betamethasone dipropionate (DIPROLENE) 0.05 % cream Apply topically 2 (  two) times daily.   cetirizine (ZYRTEC) 10 MG tablet TAKE 1 TABLET BY MOUTH EVERY DAY   Cholecalciferol (VITAMIN D) 2000 UNITS CAPS Take 2,000 Units by mouth daily.   fluticasone (FLONASE) 50 MCG/ACT nasal spray INSTILL 1 SPRAY INTO EACH NOSTRIL 2 TIMES DAILY   hydrochlorothiazide (HYDRODIURIL) 25 MG tablet Take 1 tablet (25 mg total) by mouth daily.   hydrOXYzine (VISTARIL) 25 MG capsule Take 1 capsule (25 mg total) by mouth every 8 (eight) hours as  needed.   lamoTRIgine (LAMICTAL) 200 MG tablet Take 1 tablet (200 mg total) by mouth daily.   levothyroxine (SYNTHROID) 88 MCG tablet TAKE 1 TABLET BY MOUTH EVERY DAY   loratadine (CLARITIN) 10 MG tablet Take 1 tablet (10 mg total) by mouth daily.   losartan (COZAAR) 100 MG tablet Take 1 tablet (100 mg total) by mouth daily.   meclizine (ANTIVERT) 25 MG tablet Take 1 tablet (25 mg total) by mouth 3 (three) times daily as needed for dizziness.   naproxen (NAPROSYN) 500 MG tablet TAKE 1 TABLET (500 MG TOTAL) BY MOUTH 2 (TWO) TIMES DAILY WITH A MEAL.   nystatin cream (MYCOSTATIN) Apply 1 application topically 2 (two) times daily.   ondansetron (ZOFRAN) 4 MG tablet Take 1 tablet (4 mg total) by mouth every 8 (eight) hours as needed for nausea or vomiting.   pantoprazole (PROTONIX) 40 MG tablet Take 1 tablet (40 mg total) by mouth daily.   sertraline (ZOLOFT) 100 MG tablet TAKE 2 TABLETS BY MOUTH EVERY DAY   triamcinolone cream (KENALOG) 0.1 % Apply 1 application topically 2 (two) times daily.   Facility-Administered Encounter Medications as of 07/16/2022  Medication   medroxyPROGESTERone (DEPO-PROVERA) injection 150 mg    Allergies  Allergen Reactions   Ace Inhibitors Cough   Famvir [Famciclovir] Hives and Swelling    Throat closes    Tetanus Toxoids     Swelling at sight    Review of Systems  Constitutional:  Negative for activity change, appetite change, chills, diaphoresis, fatigue, fever and unexpected weight change.  Respiratory:  Negative for cough and shortness of breath.   Cardiovascular:  Negative for chest pain, palpitations and leg swelling.  Gastrointestinal:  Negative for abdominal pain, nausea and vomiting.  Genitourinary:  Positive for dysuria, frequency, hesitancy and urgency. Negative for decreased urine volume, difficulty urinating, dyspareunia, enuresis, flank pain, genital sores, hematuria, menstrual problem, pelvic pain, vaginal bleeding, vaginal discharge and vaginal  pain.  Neurological:  Negative for dizziness, weakness and light-headedness.  Psychiatric/Behavioral:  Negative for confusion.   All other systems reviewed and are negative.        Observations/Objective: No vital signs or physical exam, this was a virtual health encounter.  Pt alert and oriented, answers all questions appropriately, and able to speak in full sentences.    Assessment and Plan: Dawn Craig was seen today for dysuria.  Diagnoses and all orders for this visit:  Dysuria Frequency of urination Suspected UTI Reported symptoms concerning for UTI. Pt unable to come to office today to provide urine sample. It is Friday and she has been symptomatic for over a week. Do not want to wait to start treatment. Prior urine cultures reviewed and treatment based off of results. Increase water intake, avoid bladder irritants. Report new, worsening, or persistent symptoms. Aware urinalysis needs to be completed in 2 weeks to make sure infection has resolved.  -     sulfamethoxazole-trimethoprim (BACTRIM DS) 800-160 MG tablet; Take 1 tablet by mouth 2 (two) times  daily for 7 days.     Follow Up Instructions: Return in about 2 weeks (around 07/30/2022), or if symptoms worsen or fail to improve, for urine.    I discussed the assessment and treatment plan with the patient. The patient was provided an opportunity to ask questions and all were answered. The patient agreed with the plan and demonstrated an understanding of the instructions.   The patient was advised to call back or seek an in-person evaluation if the symptoms worsen or if the condition fails to improve as anticipated.  The above assessment and management plan was discussed with the patient. The patient verbalized understanding of and has agreed to the management plan. Patient is aware to call the clinic if they develop any new symptoms or if symptoms persist or worsen. Patient is aware when to return to the clinic for a follow-up  visit. Patient educated on when it is appropriate to go to the emergency department.    I provided 15 minutes of time during this telephone encounter.   Kari Baars, FNP-C [facility] [address] [place], [place] [address] [phone] 07/16/2022

## 2022-07-16 NOTE — Addendum Note (Signed)
Addended by: Sonny Masters on: 07/16/2022 04:00 PM   Modules accepted: Orders

## 2022-07-23 ENCOUNTER — Telehealth: Payer: Self-pay | Admitting: Nurse Practitioner

## 2022-07-28 ENCOUNTER — Telehealth: Payer: Self-pay | Admitting: Nurse Practitioner

## 2022-07-28 ENCOUNTER — Other Ambulatory Visit: Payer: Self-pay

## 2022-07-28 DIAGNOSIS — F410 Panic disorder [episodic paroxysmal anxiety] without agoraphobia: Secondary | ICD-10-CM

## 2022-07-28 DIAGNOSIS — R300 Dysuria: Secondary | ICD-10-CM

## 2022-07-28 NOTE — Telephone Encounter (Signed)
Patient had appointment on 8/18 for uti, was told to bring a urine back in two weeks. No orders in. Please add order and call patient.

## 2022-07-29 ENCOUNTER — Other Ambulatory Visit: Payer: Self-pay | Admitting: Family Medicine

## 2022-07-29 NOTE — Telephone Encounter (Signed)
Pt aware.

## 2022-07-29 NOTE — Telephone Encounter (Signed)
Orders placed left message for patient to call back  

## 2022-07-30 ENCOUNTER — Encounter: Payer: Self-pay | Admitting: *Deleted

## 2022-07-30 ENCOUNTER — Ambulatory Visit: Payer: Medicaid Other | Admitting: Family

## 2022-07-30 ENCOUNTER — Encounter: Payer: Self-pay | Admitting: Family

## 2022-07-30 ENCOUNTER — Other Ambulatory Visit: Payer: Medicaid Other

## 2022-07-30 VITALS — BP 152/87 | HR 133 | Temp 97.4°F | Ht 62.0 in | Wt 238.0 lb

## 2022-07-30 DIAGNOSIS — R531 Weakness: Secondary | ICD-10-CM | POA: Diagnosis not present

## 2022-07-30 DIAGNOSIS — R300 Dysuria: Secondary | ICD-10-CM | POA: Diagnosis not present

## 2022-07-30 DIAGNOSIS — R000 Tachycardia, unspecified: Secondary | ICD-10-CM | POA: Diagnosis not present

## 2022-07-30 DIAGNOSIS — R009 Unspecified abnormalities of heart beat: Secondary | ICD-10-CM

## 2022-07-30 DIAGNOSIS — I10 Essential (primary) hypertension: Secondary | ICD-10-CM

## 2022-07-30 DIAGNOSIS — Z823 Family history of stroke: Secondary | ICD-10-CM

## 2022-07-30 LAB — MICROSCOPIC EXAMINATION
RBC, Urine: NONE SEEN /hpf (ref 0–2)
Renal Epithel, UA: NONE SEEN /hpf

## 2022-07-30 LAB — URINALYSIS, COMPLETE
Bilirubin, UA: NEGATIVE
Leukocytes,UA: NEGATIVE
Nitrite, UA: NEGATIVE
Specific Gravity, UA: 1.025 (ref 1.005–1.030)
Urobilinogen, Ur: 1 mg/dL (ref 0.2–1.0)
pH, UA: 5.5 (ref 5.0–7.5)

## 2022-07-30 NOTE — Progress Notes (Signed)
Subjective:    Patient ID: Dawn Craig, female    DOB: [birth year], 51 y.o.   MRN: [id number]  Chief Complaint  Patient presents with   left side draw    Goes to the left when she stands she is not dizzy   PT presents to the office today as an emergency with left side weakness that started 30 mins ago. States she was loading her groceries in her truck and started falling to the left. Denies any hx of CVA, but has mother and father both had CVA.   States vision changes with blurred vision, left sided weakness. Denies any speech.  Hypertension This is a chronic problem. The current episode started more than 1 year ago. The problem has been waxing and waning since onset. The problem is uncontrolled. Associated symptoms include malaise/fatigue. Pertinent negatives include no peripheral edema or shortness of breath. Risk factors for coronary artery disease include obesity and sedentary lifestyle. The current treatment provides no improvement.     Review of Systems  Constitutional:  Positive for malaise/fatigue.  Respiratory:  Negative for shortness of breath.   All other systems reviewed and are negative.      Objective:   Physical Exam Vitals reviewed.  Constitutional:      General: She is not in acute distress.    Appearance: She is well-developed. She is obese.  HENT:     Head: Normocephalic and atraumatic.     Right Ear: Tympanic membrane normal.     Left Ear: Tympanic membrane normal.  Eyes:     Pupils: Pupils are equal, round, and reactive to light.  Neck:     Thyroid: No thyromegaly.  Cardiovascular:     Rate and Rhythm: Normal rate and regular rhythm.     Heart sounds: Normal heart sounds. No murmur heard. Pulmonary:     Effort: Pulmonary effort is normal. No respiratory distress.     Breath sounds: Normal breath sounds. No wheezing.  Abdominal:     General: Bowel sounds are normal. There is no distension.     Palpations: Abdomen is soft.     Tenderness: There is  no abdominal tenderness.  Musculoskeletal:        General: No tenderness. Normal range of motion.     Cervical back: Normal range of motion and neck supple.  Skin:    General: Skin is warm and dry.  Neurological:     Mental Status: She is alert and oriented to person, place, and time.     Cranial Nerves: No cranial nerve deficit.     Motor: Weakness (left sided weakness) present.     Deep Tendon Reflexes: Reflexes are normal and symmetric.  Psychiatric:        Mood and Affect: Mood is anxious. Affect is tearful.        Behavior: Behavior normal.        Thought Content: Thought content normal.        Judgment: Judgment normal.        BP (!) 152/87   Pulse (!) 133   Temp (!) 97.4 F (36.3 C) (Temporal)   Ht 5\' 2"  (1.575 m)   Wt 238 lb (108 kg)   SpO2 98%   BMI 43.53 kg/m   Assessment & Plan:  Dawn Craig comes in today with chief complaint of left side draw (Goes to the left when she stands she is not dizzy)   Diagnosis and orders addressed:  1. Elevated heart rate with elevated  blood pressure and diagnosis of hypertension - EKG 12-Lead  2. Tachycardia  3. Left-sided weakness  4. Family history of cerebrovascular accident (CVA)   EMS called. Pt still complaining of left sided weakness. After their exam, pt states she has full strength. She refuses to go to ED. AMA paperwork completed. I advised she still needed to get CT head given symptoms and family hx. Continues to refuse.   Discussed warning sides and to go to ED if weakness occurs , changes in vision, gait, or speech Approx 51 mins spent with patient, chart review, education, and discussing with EMS  Jannifer Rodney, FNP   .

## 2022-08-01 LAB — URINE CULTURE

## 2022-08-31 ENCOUNTER — Telehealth: Payer: Self-pay | Admitting: Nurse Practitioner

## 2022-08-31 DIAGNOSIS — F410 Panic disorder [episodic paroxysmal anxiety] without agoraphobia: Secondary | ICD-10-CM

## 2022-08-31 NOTE — Telephone Encounter (Signed)
Patient calling to check on referral for psychiatry. She said it was supposed to be placed in September. Please call back and advise.

## 2022-09-01 NOTE — Telephone Encounter (Signed)
Please check with Loma Sousa about psychiatry referral

## 2022-09-03 NOTE — Telephone Encounter (Signed)
Patient received a call from a behavioral health office, but they were in [place].  She has requested one closer to her.  She wanted to see if she could be sent to [facility] in [place] [phone])   Could someone let her know what she can do?

## 2022-09-03 NOTE — Telephone Encounter (Signed)
Referral placed.

## 2022-09-03 NOTE — Telephone Encounter (Signed)
[facility] do [facility] to [facility] [place]

## 2022-10-11 DIAGNOSIS — Z79899 Other long term (current) drug therapy: Secondary | ICD-10-CM | POA: Diagnosis not present

## 2022-10-11 DIAGNOSIS — F411 Generalized anxiety disorder: Secondary | ICD-10-CM | POA: Diagnosis not present

## 2022-10-11 DIAGNOSIS — F315 Bipolar disorder, current episode depressed, severe, with psychotic features: Secondary | ICD-10-CM | POA: Diagnosis not present

## 2022-11-01 DIAGNOSIS — F315 Bipolar disorder, current episode depressed, severe, with psychotic features: Secondary | ICD-10-CM | POA: Diagnosis not present

## 2022-11-01 DIAGNOSIS — F411 Generalized anxiety disorder: Secondary | ICD-10-CM | POA: Diagnosis not present

## 2022-11-03 ENCOUNTER — Encounter: Payer: Self-pay | Admitting: Nurse Practitioner

## 2022-11-03 ENCOUNTER — Telehealth: Payer: Self-pay | Admitting: Nurse Practitioner

## 2022-11-03 ENCOUNTER — Ambulatory Visit: Payer: Medicaid Other | Admitting: Nurse Practitioner

## 2022-11-03 DIAGNOSIS — R300 Dysuria: Secondary | ICD-10-CM

## 2022-11-03 LAB — URINALYSIS, ROUTINE W REFLEX MICROSCOPIC
Bilirubin, UA: NEGATIVE
Glucose, UA: NEGATIVE
Nitrite, UA: NEGATIVE
RBC, UA: NEGATIVE
Specific Gravity, UA: >1.03 — ABNORMAL HIGH (ref 1.005–1.030)
Urobilinogen, Ur: 1 mg/dL (ref 0.2–1.0)
pH, UA: 5.5 (ref 5.0–7.5)

## 2022-11-03 LAB — MICROSCOPIC EXAMINATION
RBC, Urine: NONE SEEN /hpf (ref 0–2)
Renal Epithel, UA: NONE SEEN /hpf
WBC, UA: NONE SEEN /hpf (ref 0–5)

## 2022-11-03 MED ORDER — CEPHALEXIN 500 MG PO CAPS: 500.0000 mg | ORAL_CAPSULE | Freq: Two times a day (BID) | ORAL | 0 refills | Status: DC

## 2022-11-03 NOTE — Progress Notes (Signed)
   Virtual Visit  Note Due to COVID-19 pandemic this visit was conducted virtually. This visit type was conducted due to national recommendations for restrictions regarding the COVID-19 Pandemic (e.g. social distancing, sheltering in place) in an effort to limit this patient's exposure and mitigate transmission in our community. All issues noted in this document were discussed and addressed.  A physical exam was not performed with this format.  I connected with Dawn Craig on 11/03/22 at 12:13 PM by telephone and verified that I am speaking with the correct person using two identifiers. Dawn Craig is currently located at home during visit. The provider, Daryll Drown, NP is located in their office at time of visit.  I discussed the limitations, risks, security and privacy concerns of performing an evaluation and management service by telephone and the availability of in person appointments. I also discussed with the patient that there may be a patient responsible charge related to this service. The patient expressed understanding and agreed to proceed.   History and Present Illness:  Dysuria  This is a recurrent problem. The current episode started in the past 7 days. The problem occurs every urination. The problem has been unchanged. The quality of the pain is described as burning. The pain is moderate. There has been no fever. Associated symptoms include chills, flank pain and nausea. She has tried acetaminophen and increased fluids for the symptoms. The treatment provided no relief. Her past medical history is significant for recurrent UTIs.      Review of Systems  Constitutional:  Positive for chills.  Respiratory: Negative.    Gastrointestinal:  Positive for nausea.  Genitourinary:  Positive for dysuria and flank pain.  Skin: Negative.  Negative for itching and rash.  All other systems reviewed and are negative.    Observations/Objective: Televisit patient not in  distress  Assessment and Plan: Patient presents with UTI symptoms for the past 7 days.  Foul-smelling urine, bilateral CVA tenderness and abdominal pain.  UTI  vs  interstitial cystitis -urinalysis completed results pending -pyridium for pain Precaution and education provided -All questions answered   Follow Up Instructions: Follow-up with worsening or unresolved symptoms.    I discussed the assessment and treatment plan with the patient. The patient was provided an opportunity to ask questions and all were answered. The patient agreed with the plan and demonstrated an understanding of the instructions.   The patient was advised to call back or seek an in-person evaluation if the symptoms worsen or if the condition fails to improve as anticipated.  The above assessment and management plan was discussed with the patient. The patient verbalized understanding of and has agreed to the management plan. Patient is aware to call the clinic if symptoms persist or worsen. Patient is aware when to return to the clinic for a follow-up visit. Patient educated on when it is appropriate to go to the emergency department.   Time call ended: 12:26 PM  I provided 13 minutes of  non face-to-face time during this encounter.    Daryll Drown, NP

## 2022-11-03 NOTE — Telephone Encounter (Signed)
She is upset with me because her psych referral has not been done yet. I just found out.

## 2022-11-03 NOTE — Telephone Encounter (Signed)
Fine with me

## 2022-11-03 NOTE — Addendum Note (Signed)
Addended by: Daryll Drown on: 11/03/2022 04:46 PM   Modules accepted: Orders

## 2022-11-03 NOTE — Patient Instructions (Signed)
Infection des voies urinaires chez l'adulte Urinary Tract Infection, Adult  Une infection des [place] (IVU) est une infection qui touche une partie des voies urinaires. Les voies urinaires [employer] reins, les uretres, la vessie et [employer]. Ces organes produisent, stockent et liminent [employer]. Une infection des voies urinaires suprieures touche les uretres et [employer]. Une infection des voies urinaires infrieures touche la vessie et [employer]. Quelles sont les causes ? La plupart des infections des voies urinaires sont dues  la prsence de bactries dans la rgion gnitale, autour de l'urtre. L'urtre est l'endroit par o l'urine est vacue [employer]. Ces bactries se multiplient et provoquent une inflammation des [place]. Quels sont les facteurs qui augmentent le risque ? Votre risque de dvelopper cette affection est plus lev si : Vous portez un cathter urinaire en permanence. Vous n'tes pas capable de contrler votre miction ou vos selles (si vous souffrez d'incontinence). Vous tes une femme et vous : Lisbeth Renshaw un spermicide ou un diaphragme comme moyen de contraception. Avez des [employer]. tes enceinte. Vous avez certains gnes qui augmentent [employer]. Vous tes sexuellement actif(-ve). Vous prenez des [employer]. Vous souffrez d'une affection qui engendre une diminution de la quantit de vos urines comme : Une hypertrophie de la prostate, si vous tes un homme. Un blocage au niveau de l'urtre. Un calcul rnal. Une affection nerveuse qui affecte votre contrle de la vessie (vessie neurogne). Le fait de ne pas boire suffisamment ou de ne pas uriner souvent. Vous souffrez de certaines affections mdicales, comme : Un diabte. Un systme de dfense contre les maladies (systmeimmunitaire) affaibli. La drpanocytose. La goutte. Une lsion de la moelle pinire. Quels sont les signes ou symptmes ? Les symptmes  de cette affection incluent : Un besoin d'uriner immdiatement (d'urgence). Des mictions frquentes. Les mictions peuvent ne produire que trs peu d'urine. Une douleur ou une sensation de brlure  la miction. La prsence de sang dans les urines. Une urine qui dgage [facility] odeur ou une odeur inhabituelle. Des difficults  uriner. Une urine trouble. Des pertes vaginales, si vous tes une femme. Une douleur  l'abdomen ou dans le bas [employer]. Vous pouvez galement avoir : Des vomissements ou un manque d'apptit. Une confusion. De l'irritabilit ou de la fatigue. Une fivre ou des frissons. Des diarrhes. Le premier symptme [employer] personnes ges peut tre une confusion. Dans certains cas, il se peut qu'aucun symptme ne se manifeste tant que l'infection ne s'est pas aggrave. Comment se fait le diagnostic ? Le diagnostic repose sur les antcdents mdicaux et un examen physique. Vous pourrez galement devoir effectuer d'autres examens, notamment : Des analyses d'urine. Des analyses de sang. Des tests de dpistage d'IST (infection sexuellement transmissible). Si vous avez dj eu plusieurs IVU, une cystoscopie ou des examens d'imagerie pourront tre effectus pour dterminer la cause des infections. Comment cette affection est-elle traite ? Le traitement de cette affection peut comprendre : Des antibiotiques. Des mdicaments en vente libre pour [employer] gne. Un apport en eau suffisant pour rester hydrat(e). Si vous avez des infections  rptition ou si vous souffrez d'une autre affection comme un calcul rnal, vous devrez peut-tre consulter un prestataire de soins de sant spcialiste des voies urinaires (urologue). Dans de rares cas, les infections des voies urinaires peuvent provoquer une septicmie. La septicmie est une affection potentiellement mortelle qui survient lorsque l'organisme ragit  une infection. La septicmie est traite  l'hpital par l'administration  d'antibiotiques intraveineux, de fluides  et d'autres mdicaments. Suivez les instructions suivantes  domicile :  Consulting civil engineer vos mdicaments en vente libre et sur ordonnance en suivant scrupuleusement les instructions de votre prestataire de soins de sant. Si un antibiotique vous a t prescrit, prenez-le en suivant les recommandations de votre prestataire de soins de sant. N'arrtez pas le traitement antibiotique mme si vous commencez  vous sentir mieux. Instructions gnrales [place]  : Vider votre vessie plus souvent et compltement. Ne pas retenir votre urine pendant de longues priodes. Vider votre vessie aprs les relations sexuelles. Vous essuyer de l'avant vers l'arrire aprs avoir urin ou tre [employer]el, si vous tes une femme. N'utiliser la feuille de papier toilette qu'une seule fois. Boire des [employer] de liquides pour garder votre urine jaune ple. Rendez-vous  toutes vos visites de suivi. C'est important. Prenez contact avec un prestataire de soins de sant si : Vos symptmes ne diminuent pas aprs 1  2 jours. Vos symptmes disparaissent, puis reviennent. Demandez immdiatement de l'aide si : Vous avez trs mal au [employer] bas-ventre. Vous avez de la fivre ou des frissons. Vous avez des nauses ou vous vomissez. Rsum Une infection des [place] (IVU) est une infection qui touche une partie des voies urinaires. Les voies urinaires [employer] reins, les uretres, la vessie et [employer]. La plupart des infections des voies urinaires sont dues  la prsence de bactries dans la rgion gnitale. Le traitement de cette affection comprend [employer]. Si un antibiotique vous a t prescrit, prenez-le en suivant les recommandations de votre prestataire de soins de sant. N'arrtez pas le traitement antibiotique mme si vous commencez  vous sentir mieux. Rendez-vous  toutes vos visites de suivi. C'est important. Ces  conseils et renseignements ne sauraient se substituer  l'avis mdical de votre prestataire de soins de sant. Par consquent, il est primordial de parler de toutes vos proccupations avec votre prestataire de soins de sant. Document Revised: 09/08/2020 Document Reviewed: 09/08/2020 Elsevier Patient Education  2023 [employer].

## 2022-11-05 LAB — CULTURE, URINE COMPREHENSIVE

## 2022-11-09 DIAGNOSIS — R1084 Generalized abdominal pain: Secondary | ICD-10-CM | POA: Diagnosis not present

## 2022-11-09 DIAGNOSIS — Z888 Allergy status to other drugs, medicaments and biological substances status: Secondary | ICD-10-CM | POA: Diagnosis not present

## 2022-11-09 DIAGNOSIS — R109 Unspecified abdominal pain: Secondary | ICD-10-CM | POA: Diagnosis not present

## 2022-11-09 DIAGNOSIS — R000 Tachycardia, unspecified: Secondary | ICD-10-CM | POA: Diagnosis not present

## 2022-11-09 DIAGNOSIS — K760 Fatty (change of) liver, not elsewhere classified: Secondary | ICD-10-CM | POA: Diagnosis not present

## 2022-11-09 DIAGNOSIS — K8020 Calculus of gallbladder without cholecystitis without obstruction: Secondary | ICD-10-CM | POA: Diagnosis not present

## 2022-11-09 DIAGNOSIS — Z887 Allergy status to serum and vaccine status: Secondary | ICD-10-CM | POA: Diagnosis not present

## 2022-11-09 NOTE — Telephone Encounter (Signed)
Pt called stating that she doesn't want to see MMM anymore and if [facility] has declined to be her PCP then she wants to know if Harlow Mares or Kari Baars will be her PCP?

## 2022-11-10 NOTE — Telephone Encounter (Signed)
I decline as well.

## 2022-11-11 ENCOUNTER — Telehealth: Payer: Self-pay

## 2022-11-11 NOTE — Telephone Encounter (Signed)
Called and spoke with patient. Advised that no other provider would agree to take her on as a patient and asked if she wanted to continue with MMM. She stated that she would find another provider.

## 2022-11-11 NOTE — Patient Outreach (Signed)
Transition Care Management Unsuccessful Follow-up Telephone Call  Date of discharge and from where:  11/09/22 [facility]  Attempts:  1st Attempt  Reason for unsuccessful TCM follow-up call:  Left voice message  Gus Puma, BSW, [facility]  [facility]  High Risk Managed Medicaid Team  [phone]

## 2022-11-15 DIAGNOSIS — F900 Attention-deficit hyperactivity disorder, predominantly inattentive type: Secondary | ICD-10-CM | POA: Diagnosis not present

## 2022-12-01 DIAGNOSIS — F315 Bipolar disorder, current episode depressed, severe, with psychotic features: Secondary | ICD-10-CM | POA: Diagnosis not present

## 2022-12-01 DIAGNOSIS — F411 Generalized anxiety disorder: Secondary | ICD-10-CM | POA: Diagnosis not present

## 2022-12-09 ENCOUNTER — Other Ambulatory Visit: Payer: Self-pay | Admitting: Nurse Practitioner

## 2022-12-09 DIAGNOSIS — E032 Hypothyroidism due to medicaments and other exogenous substances: Secondary | ICD-10-CM

## 2022-12-29 DIAGNOSIS — F411 Generalized anxiety disorder: Secondary | ICD-10-CM | POA: Diagnosis not present

## 2022-12-29 DIAGNOSIS — Z5181 Encounter for therapeutic drug level monitoring: Secondary | ICD-10-CM | POA: Diagnosis not present

## 2022-12-29 DIAGNOSIS — Z79899 Other long term (current) drug therapy: Secondary | ICD-10-CM | POA: Diagnosis not present

## 2022-12-29 DIAGNOSIS — F315 Bipolar disorder, current episode depressed, severe, with psychotic features: Secondary | ICD-10-CM | POA: Diagnosis not present

## 2023-01-08 ENCOUNTER — Other Ambulatory Visit: Payer: Self-pay | Admitting: Nurse Practitioner

## 2023-01-13 ENCOUNTER — Ambulatory Visit: Payer: Medicaid Other | Admitting: Nurse Practitioner

## 2023-01-25 ENCOUNTER — Encounter: Payer: Self-pay | Admitting: Nurse Practitioner

## 2023-01-26 DIAGNOSIS — F411 Generalized anxiety disorder: Secondary | ICD-10-CM | POA: Diagnosis not present

## 2023-01-26 DIAGNOSIS — F315 Bipolar disorder, current episode depressed, severe, with psychotic features: Secondary | ICD-10-CM | POA: Diagnosis not present

## 2023-01-26 DIAGNOSIS — R457 State of emotional shock and stress, unspecified: Secondary | ICD-10-CM | POA: Diagnosis not present

## 2023-01-28 ENCOUNTER — Ambulatory Visit: Payer: Medicaid Other | Admitting: Nurse Practitioner

## 2023-01-28 ENCOUNTER — Encounter: Payer: Self-pay | Admitting: Nurse Practitioner

## 2023-01-28 VITALS — BP 103/68 | HR 108 | Temp 97.2°F | Resp 20 | Ht 62.0 in | Wt 218.0 lb

## 2023-01-28 DIAGNOSIS — Z0000 Encounter for general adult medical examination without abnormal findings: Secondary | ICD-10-CM

## 2023-01-28 DIAGNOSIS — I10 Essential (primary) hypertension: Secondary | ICD-10-CM

## 2023-01-28 DIAGNOSIS — F50819 Binge eating disorder, unspecified: Secondary | ICD-10-CM

## 2023-01-28 DIAGNOSIS — E032 Hypothyroidism due to medicaments and other exogenous substances: Secondary | ICD-10-CM | POA: Diagnosis not present

## 2023-01-28 DIAGNOSIS — F410 Panic disorder [episodic paroxysmal anxiety] without agoraphobia: Secondary | ICD-10-CM

## 2023-01-28 DIAGNOSIS — K219 Gastro-esophageal reflux disease without esophagitis: Secondary | ICD-10-CM

## 2023-01-28 DIAGNOSIS — Z0001 Encounter for general adult medical examination with abnormal findings: Secondary | ICD-10-CM

## 2023-01-28 DIAGNOSIS — F4001 Agoraphobia with panic disorder: Secondary | ICD-10-CM

## 2023-01-28 DIAGNOSIS — E785 Hyperlipidemia, unspecified: Secondary | ICD-10-CM | POA: Diagnosis not present

## 2023-01-28 DIAGNOSIS — R4189 Other symptoms and signs involving cognitive functions and awareness: Secondary | ICD-10-CM

## 2023-01-28 DIAGNOSIS — F3181 Bipolar II disorder: Secondary | ICD-10-CM

## 2023-01-28 DIAGNOSIS — F5081 Binge eating disorder: Secondary | ICD-10-CM

## 2023-01-28 DIAGNOSIS — N3941 Urge incontinence: Secondary | ICD-10-CM

## 2023-01-28 DIAGNOSIS — R1319 Other dysphagia: Secondary | ICD-10-CM

## 2023-01-28 MED ORDER — LOSARTAN POTASSIUM 100 MG PO TABS: 100.0000 mg | ORAL_TABLET | Freq: Every day | ORAL | 1 refills | Status: DC

## 2023-01-28 MED ORDER — LEVOTHYROXINE SODIUM 88 MCG PO TABS: 88.0000 ug | ORAL_TABLET | Freq: Every day | ORAL | 1 refills | Status: DC

## 2023-01-28 MED ORDER — PANTOPRAZOLE SODIUM 40 MG PO TBEC: 40.0000 mg | DELAYED_RELEASE_TABLET | Freq: Every day | ORAL | 1 refills | Status: DC

## 2023-01-28 MED ORDER — HYDROCHLOROTHIAZIDE 25 MG PO TABS: 25.0000 mg | ORAL_TABLET | Freq: Every day | ORAL | 1 refills | Status: DC

## 2023-01-28 MED ORDER — FLUTICASONE PROPIONATE 50 MCG/ACT NA SUSP: 2.0000 | Freq: Every day | NASAL | 1 refills | Status: DC

## 2023-01-28 MED ORDER — MIRABEGRON ER 25 MG PO TB24: 25.0000 mg | ORAL_TABLET | Freq: Every day | ORAL | 1 refills | Status: DC

## 2023-01-28 NOTE — Progress Notes (Addendum)
Subjective:    Patient ID: Dawn Craig, female    DOB: November 01, 1971, 52 y.o.   MRN: [id number]   Chief Complaint: annual physical   HPI:  Dawn Craig is a 52 y.o. who identifies as a female who was assigned female at birth.   Social history: Lives with: by herself Work history: disability   Comes in today for follow up of the following chronic medical issues:  1. Essential hypertension, benign No c/o chest pain, sob or headache. She does not check blood pressure at home. Her panic attacks make her blood pressure go up. BP Readings from Last 3 Encounters:  01/28/23 103/68  07/30/22 (!) 152/87  07/13/22 135/86     2. Hyperlipidemia with target LDL less than 100 Does not watch diet and does no dedicated exercise. Lab Results  Component Value Date   CHOL 259 (H) 07/13/2022   HDL 28 (L) 07/13/2022   LDLCALC 147 (H) 07/13/2022   TRIG 445 (H) 07/13/2022   CHOLHDL 9.3 (H) 07/13/2022   The 10-year ASCVD risk score (Arnett DK, et al., 2019) is: 3.7%   3. Gastroesophageal reflux disease, unspecified whether esophagitis present Has to take protonix daiy to keep symptoms under control.  4. Esophageal dysphagia No recent  swallowing issues. Only has issues with taking pills. Occurs very rarely.  5. 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 Binge eating disorder Tends to over eat on a daily basis. Is a stress eater  7. Bipolar II disorder ([place]) Is on lamictal and zoloft. Combinationis working okfor her  8. Agoraphobia with panic attacks She still does not go out  of the house. Her brother in law and sister do all of her grocery shopping. The only  time she goes out of the house is when she goes to the doctor.  9. Panic disorder Does not like to be around crowds and or people. Gets very anxious when she is not in her home.  Patient has been seeing psych and  that is really helping her. They think she may be having mini strokes and suggest she see neurologist  10. Urge incontinence of urine Has occasional leakage. She is currently on no medication. She has to wear pads all the time.  11. Severe obesity (BMI >= 40) (HCC) No recent weight changes Wt Readings from Last 3 Encounters:  01/28/23 218 lb (98.9 kg)  07/30/22 238 lb (108 kg)  07/13/22 230 lb (104.3 kg)   BMI Readings from Last 3 Encounters:  01/28/23 39.87 kg/m  07/30/22 43.53 kg/m  07/13/22 42.07 kg/m      New complaints: none  Allergies  Allergen Reactions   Ace Inhibitors Cough   Famvir [Famciclovir] Hives and Swelling    Throat closes    Tetanus Toxoids     Swelling at sight   Outpatient Encounter Medications as of 01/28/2023  Medication Sig   betamethasone dipropionate (DIPROLENE) 0.05 % cream Apply topically 2 (two) times daily.   cephALEXin (KEFLEX) 500 MG capsule Take 1 capsule (500 mg total) by mouth 2 (two) times daily.   cetirizine (ZYRTEC) 10 MG tablet TAKE 1 TABLET BY MOUTH EVERY DAY   Cholecalciferol (VITAMIN D) 2000 UNITS CAPS Take 2,000 Units by mouth daily.   fluticasone (FLONASE) 50 MCG/ACT nasal spray INSTILL 1 SPRAY INTO EACH NOSTRIL 2 TIMES DAILY   hydrochlorothiazide (HYDRODIURIL) 25 MG tablet Take 1  tablet (25 mg total) by mouth daily.   hydrOXYzine (VISTARIL) 25 MG capsule Take 1 capsule (25 mg total) by mouth every 8 (eight) hours as needed.   lamoTRIgine (LAMICTAL) 200 MG tablet Take 1 tablet (200 mg total) by mouth daily.   levothyroxine (SYNTHROID) 88 MCG tablet TAKE 1 TABLET BY MOUTH EVERY DAY   loratadine (CLARITIN) 10 MG tablet TAKE 1 TABLET BY MOUTH EVERY DAY   losartan (COZAAR) 100 MG tablet Take 1 tablet (100 mg total) by mouth daily.   meclizine (ANTIVERT) 25 MG tablet Take 1 tablet (25 mg total) by mouth 3 (three) times daily as needed for dizziness. (Patient not taking: Reported on 07/30/2022)   naproxen (NAPROSYN) 500 MG tablet TAKE  1 TABLET (500 MG TOTAL) BY MOUTH 2 (TWO) TIMES DAILY WITH A MEAL.   nystatin cream (MYCOSTATIN) Apply 1 application topically 2 (two) times daily.   ondansetron (ZOFRAN) 4 MG tablet Take 1 tablet (4 mg total) by mouth every 8 (eight) hours as needed for nausea or vomiting.   pantoprazole (PROTONIX) 40 MG tablet Take 1 tablet (40 mg total) by mouth daily.   sertraline (ZOLOFT) 100 MG tablet TAKE 2 TABLETS BY MOUTH EVERY DAY   triamcinolone cream (KENALOG) 0.1 % Apply 1 application topically 2 (two) times daily.   Facility-Administered Encounter Medications as of 01/28/2023  Medication   medroxyPROGESTERone (DEPO-PROVERA) injection 150 mg    Past Surgical History:  Procedure Laterality Date   CESAREAN SECTION      Family History  Problem Relation Age of Onset   Hypertension Mother    Cancer Mother        skin,face   Colon polyps Mother        had to have surgery. Possible in 56s, early 64s    Hypertension Father    Hyperlipidemia Father    Depression Maternal Aunt    Bipolar disorder Cousin    Depression Other    Suicidality Other    Colon cancer Neg Hx       Controlled substance contract: n/a     Review of Systems  Constitutional:  Negative for diaphoresis.  Eyes:  Negative for pain.  Respiratory:  Negative for shortness of breath.   Cardiovascular:  Negative for chest pain, palpitations and leg swelling.  Gastrointestinal:  Negative for abdominal pain.  Endocrine: Negative for polydipsia.  Skin:  Negative for rash.  Neurological:  Negative for dizziness, weakness and headaches.  Hematological:  Does not bruise/bleed easily.  All other systems reviewed and are negative.      Objective:   Physical Exam Vitals and nursing note reviewed.  Constitutional:      General: She is not in acute distress.    Appearance: Normal appearance. She is well-developed.  HENT:     Head: Normocephalic.     Right Ear: Tympanic membrane normal.     Left Ear: Tympanic membrane  normal.     Nose: Nose normal.     Mouth/Throat:     Mouth: Mucous membranes are moist.  Eyes:     Pupils: Pupils are equal, round, and reactive to light.  Neck:     Vascular: No carotid bruit or JVD.  Cardiovascular:     Rate and Rhythm: Normal rate and regular rhythm.     Heart sounds: Normal heart sounds.  Pulmonary:     Effort: Pulmonary effort is normal. No respiratory distress.     Breath sounds: Normal breath sounds. No wheezing or rales.  Chest:  Chest wall: No tenderness.  Abdominal:     General: Bowel sounds are normal. There is no distension or abdominal bruit.     Palpations: Abdomen is soft. There is no hepatomegaly, splenomegaly, mass or pulsatile mass.     Tenderness: There is no abdominal tenderness.  Musculoskeletal:        General: Normal range of motion.     Cervical back: Normal range of motion and neck supple.  Lymphadenopathy:     Cervical: No cervical adenopathy.  Skin:    General: Skin is warm and dry.  Neurological:     Mental Status: She is alert and oriented to person, place, and time.     Deep Tendon Reflexes: Reflexes are normal and symmetric.  Psychiatric:        Behavior: Behavior normal.        Thought Content: Thought content normal.        Judgment: Judgment normal.    BP 103/68   Pulse (!) 108   Temp (!) 97.2 F (36.2 C) (Temporal)   Resp 20   Ht '5\' 2"'$  (1.575 m)   Wt 218 lb (98.9 kg)   SpO2 97%   BMI 39.87 kg/m         Assessment & Plan:   Dawn Craig in today with chief complaint of Annual Exam (Elevated blood pressure. Called EMS and didn't go to the hospital. )   1. Annual physical exam   2. Essential hypertension, benign Low sodium diet - CBC with Differential/Platelet - CMP14+EGFR - hydrochlorothiazide (HYDRODIURIL) 25 MG tablet; Take 1 tablet (25 mg total) by mouth daily.  Dispense: 90 tablet; Refill: 1 - losartan (COZAAR) 100 MG tablet; Take 1 tablet (100 mg total) by mouth daily.  Dispense: 90 tablet;  Refill: 1  3. Hyperlipidemia with target LDL less than 100 Low fat diet - Lipid panel  4. Gastroesophageal reflux disease, unspecified whether esophagitis present Avoid spicy foods Do not eat 2 hours prior to bedtime - pantoprazole (PROTONIX) 40 MG tablet; Take 1 tablet (40 mg total) by mouth daily.  Dispense: 90 tablet; Refill: 1  5. Esophageal dysphagia Drink full glass of water with meds  6. 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  Dispense: 90 tablet; Refill: 1  7. Binge eating disorder   8. Bipolar II disorder ([place]) 9. Agoraphobia with panic attacks  Ambulatory referral to Psychology 10. Panic disorder Keep follow up with psych  11. Urge incontinence of urine Start on myrbetriq daily Will  take several weeks to be fully effective - mirabegron ER (MYRBETRIQ) 25 MG TB24 tablet; Take 1 tablet (25 mg total) by mouth daily.  Dispense: 90 tablet; Refill: 1  12. Severe obesity (BMI >= 40) (HCC) Discussed diet and exercise for person with BMI >25 Will recheck weight in 3-6 months   13. Cognitive changes - Ambulatory referral to Neurology    The above assessment and management plan was discussed with the patient. The patient verbalized understanding of and has agreed to the management plan. Patient is aware to call the clinic if symptoms persist or worsen. Patient is aware when to return to the clinic for a follow-up visit. Patient educated on when it is appropriate to go to the emergency department.   Mary-Margaret Hassell Done, FNP

## 2023-01-28 NOTE — Patient Instructions (Signed)

## 2023-01-29 LAB — CBC WITH DIFFERENTIAL/PLATELET
Basophils Absolute: 0.1 10*3/uL (ref 0.0–0.2)
Basos: 1 %
EOS (ABSOLUTE): 0.2 10*3/uL (ref 0.0–0.4)
Eos: 2 %
Hematocrit: 43.2 % (ref 34.0–46.6)
Hemoglobin: 14.7 g/dL (ref 11.1–15.9)
Immature Grans (Abs): 0 10*3/uL (ref 0.0–0.1)
Immature Granulocytes: 0 %
Lymphocytes Absolute: 3.4 10*3/uL — ABNORMAL HIGH (ref 0.7–3.1)
Lymphs: 40 %
MCH: 32.5 pg (ref 26.6–33.0)
MCHC: 34 g/dL (ref 31.5–35.7)
MCV: 96 fL (ref 79–97)
Monocytes Absolute: 0.5 10*3/uL (ref 0.1–0.9)
Monocytes: 6 %
Neutrophils Absolute: 4.3 10*3/uL (ref 1.4–7.0)
Neutrophils: 51 %
Platelets: 343 10*3/uL (ref 150–450)
RBC: 4.52 x10E6/uL (ref 3.77–5.28)
RDW: 12.5 % (ref 11.7–15.4)
WBC: 8.4 10*3/uL (ref 3.4–10.8)

## 2023-01-29 LAB — CMP14+EGFR
ALT: 20 IU/L (ref 0–32)
AST: 27 IU/L (ref 0–40)
Albumin/Globulin Ratio: 1.9 (ref 1.2–2.2)
Albumin: 4.6 g/dL (ref 3.8–4.9)
Alkaline Phosphatase: 87 IU/L (ref 44–121)
BUN/Creatinine Ratio: 11 (ref 9–23)
BUN: 12 mg/dL (ref 6–24)
Bilirubin Total: 0.6 mg/dL (ref 0.0–1.2)
CO2: 23 mmol/L (ref 20–29)
Calcium: 10.1 mg/dL (ref 8.7–10.2)
Chloride: 98 mmol/L (ref 96–106)
Creatinine, Ser: 1.1 mg/dL — ABNORMAL HIGH (ref 0.57–1.00)
Globulin, Total: 2.4 g/dL (ref 1.5–4.5)
Glucose: 191 mg/dL — ABNORMAL HIGH (ref 70–99)
Potassium: 4 mmol/L (ref 3.5–5.2)
Sodium: 140 mmol/L (ref 134–144)
Total Protein: 7 g/dL (ref 6.0–8.5)
eGFR: 61 mL/min/{1.73_m2} (ref >59)

## 2023-01-29 LAB — THYROID PANEL WITH TSH
Free Thyroxine Index: 2.8 (ref 1.2–4.9)
T3 Uptake Ratio: 26 % (ref 24–39)
T4, Total: 10.9 ug/dL (ref 4.5–12.0)
TSH: 5.96 u[IU]/mL — ABNORMAL HIGH (ref 0.450–4.500)

## 2023-01-29 LAB — LIPID PANEL
Chol/HDL Ratio: 7.8 ratio — ABNORMAL HIGH (ref 0.0–4.4)
Cholesterol, Total: 233 mg/dL — ABNORMAL HIGH (ref 100–199)
HDL: 30 mg/dL — ABNORMAL LOW (ref >39)
LDL Chol Calc (NIH): 142 mg/dL — ABNORMAL HIGH (ref 0–99)
Triglycerides: 334 mg/dL — ABNORMAL HIGH (ref 0–149)
VLDL Cholesterol Cal: 61 mg/dL — ABNORMAL HIGH (ref 5–40)

## 2023-01-31 ENCOUNTER — Telehealth: Payer: Self-pay | Admitting: Nurse Practitioner

## 2023-01-31 MED ORDER — LEVOTHYROXINE SODIUM 100 MCG PO TABS: 100.0000 ug | ORAL_TABLET | Freq: Every day | ORAL | 1 refills | Status: DC

## 2023-01-31 NOTE — Addendum Note (Signed)
Addended by: Chevis Pretty on: 01/31/2023 07:56 AM   Modules accepted: Orders

## 2023-02-01 ENCOUNTER — Telehealth: Payer: Self-pay | Admitting: Nurse Practitioner

## 2023-02-01 ENCOUNTER — Telehealth: Payer: Self-pay

## 2023-02-01 MED ORDER — ROSUVASTATIN CALCIUM 10 MG PO TABS: 10.0000 mg | ORAL_TABLET | Freq: Every day | ORAL | 1 refills | Status: DC

## 2023-02-01 MED ORDER — OXYBUTYNIN CHLORIDE ER 10 MG PO TB24: 10.0000 mg | ORAL_TABLET | Freq: Every day | ORAL | 1 refills | Status: DC

## 2023-02-01 NOTE — Telephone Encounter (Signed)
Patient's insurance will not cover Myrbetriq.  Covered alternative is Oxybutynin Chloride ER.

## 2023-02-01 NOTE — Telephone Encounter (Signed)
Patient is concerned that her cholesterol has been elevated the past few times and would like to go on medication. Please review and advise

## 2023-02-01 NOTE — Telephone Encounter (Signed)
Spoke with patient about two different meds. According to insurance they will not cover Myrbetriq so oxybutynin was sent in. Pt states that pharmacy has both rx ready to pick up. Advised patient to pick up Myrbetriq if insurance covers. Patient verbalized understanding

## 2023-02-01 NOTE — Telephone Encounter (Signed)
Please let patient know we changed nyrbetriq to oxybutyn and added crestor '10mg'$  to meds for her bad LDL

## 2023-02-01 NOTE — Telephone Encounter (Signed)
Pt r/c.

## 2023-02-01 NOTE — Telephone Encounter (Signed)
Patient notified and verbalized understanding. 

## 2023-02-01 NOTE — Telephone Encounter (Signed)
Sent in crestor 10 mg 1 PO daily  Meds ordered this encounter  Medications   rosuvastatin (CRESTOR) 10 MG tablet    Sig: Take 1 tablet (10 mg total) by mouth daily.    Dispense:  90 tablet    Refill:  1    Order Specific Question:   Supervising Provider    Answer:   Worthy Rancher [id number]   [place], FNP

## 2023-02-01 NOTE — Telephone Encounter (Signed)
Patient calling because there were two medications called in for her bladder issue and she is wanting to know if she needs to take both or if the second was called in due to the first not being available originally. Please call back and advise.

## 2023-03-28 ENCOUNTER — Ambulatory Visit: Payer: Medicaid Other | Admitting: Neurology

## 2023-05-14 ENCOUNTER — Other Ambulatory Visit: Payer: Self-pay | Admitting: Nurse Practitioner

## 2023-05-31 ENCOUNTER — Other Ambulatory Visit: Payer: Self-pay | Admitting: Nurse Practitioner

## 2023-06-05 ENCOUNTER — Other Ambulatory Visit: Payer: Self-pay | Admitting: Nurse Practitioner

## 2023-08-02 ENCOUNTER — Ambulatory Visit: Payer: MEDICAID | Admitting: Nurse Practitioner

## 2023-08-02 ENCOUNTER — Encounter: Payer: Self-pay | Admitting: Nurse Practitioner

## 2023-08-02 NOTE — Progress Notes (Deleted)
Subjective:    Patient ID: Dawn Craig, female    DOB: [birth year], 52 y.o.   MRN: [id number]   Chief Complaint: medical management of chronic issues     HPI:  Dawn Craig is a 52 y.o. who identifies as a female who was assigned female at birth.   Social history: Lives with: by herself Work history: disability   Comes in today for follow up of the following chronic medical issues:  1. Essential hypertension, benign No c/o chetst pain, sob or headache. Does not check blood pressure at home. BP Readings from Last 3 Encounters:  01/28/23 103/68  07/30/22 (!) 152/87  07/13/22 135/86    2. Hyperlipidemia with target LDL less than 100 Does not watch diet at all and does no exercise. Lab Results  Component Value Date   CHOL 233 (H) 01/28/2023   HDL 30 (L) 01/28/2023   LDLCALC 142 (H) 01/28/2023   TRIG 334 (H) 01/28/2023   CHOLHDL 7.8 (H) 01/28/2023     3. Gastroesophageal reflux disease, unspecified whether esophagitis present Takes protonix daily and does well most days.  4. Esophageal dysphagia No recent swallowing issues  5. Intractable chronic migraine without aura and with status migrainosus ***  6. 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 Lab Results  Component Value Date   TSH 5.960 (H) 01/28/2023     7. Bipolar II disorder (HCC) 8. Panic disorder Is on lamictal and zoloft. Has really been struggling since the death of her son. She very seldom goes out of her house except to go to the doctor. Her sister and her husband do her grocery shopping for her nd run errands for her.  ***  9. Severe obesity (BMI >= 40) (HCC) No recent weight changes   New complaints: ***  Allergies  Allergen Reactions   Ace Inhibitors Cough   Famvir [Famciclovir] Hives and Swelling    Throat closes    Tetanus Toxoids     Swelling at sight   Outpatient Encounter Medications as of 08/02/2023  Medication Sig    betamethasone dipropionate (DIPROLENE) 0.05 % cream Apply topically 2 (two) times daily.   CAPLYTA 42 MG capsule SMARTSIG:1 Capsule(s) By Mouth Every Evening   cetirizine (ZYRTEC) 10 MG tablet TAKE 1 TABLET BY MOUTH EVERY DAY   Cholecalciferol (VITAMIN D) 2000 UNITS CAPS Take 2,000 Units by mouth daily.   fluticasone (FLONASE) 50 MCG/ACT nasal spray SPRAY 2 SPRAYS INTO EACH NOSTRIL EVERY DAY   hydrochlorothiazide (HYDRODIURIL) 25 MG tablet Take 1 tablet (25 mg total) by mouth daily.   hydrOXYzine (VISTARIL) 25 MG capsule Take 1 capsule (25 mg total) by mouth every 8 (eight) hours as needed.   lamoTRIgine (LAMICTAL) 200 MG tablet Take 1 tablet (200 mg total) by mouth daily.   levothyroxine (SYNTHROID) 100 MCG tablet TAKE 1 TABLET BY MOUTH EVERY DAY   loratadine (CLARITIN) 10 MG tablet TAKE 1 TABLET BY MOUTH EVERY DAY   losartan (COZAAR) 100 MG tablet Take 1 tablet (100 mg total) by mouth daily.   nystatin cream (MYCOSTATIN) Apply 1 application topically 2 (two) times daily.   oxybutynin (DITROPAN-XL) 10 MG 24 hr tablet TAKE 1 TABLET BY MOUTH EVERYDAY AT BEDTIME   pantoprazole (PROTONIX) 40 MG tablet Take 1 tablet (40 mg total) by mouth daily.   rosuvastatin (CRESTOR) 10 MG tablet TAKE 1 TABLET BY MOUTH EVERY DAY   sertraline (ZOLOFT) 100 MG tablet TAKE 2 TABLETS BY MOUTH EVERY DAY  triamcinolone cream (KENALOG) 0.1 % Apply 1 application topically 2 (two) times daily.   Facility-Administered Encounter Medications as of 08/02/2023  Medication   medroxyPROGESTERone (DEPO-PROVERA) injection 150 mg    Past Surgical History:  Procedure Laterality Date   CESAREAN SECTION      Family History  Problem Relation Age of Onset   Hypertension Mother    Cancer Mother        skin,face   Colon polyps Mother        had to have surgery. Possible in 86s, early 18s    Hypertension Father    Hyperlipidemia Father    Depression Maternal Aunt    Bipolar disorder Cousin    Depression Other     Suicidality Other    Colon cancer Neg Hx       Controlled substance contract: ***     Review of Systems     Objective:   Physical Exam        Assessment & Plan:

## 2023-08-08 ENCOUNTER — Other Ambulatory Visit: Payer: Self-pay | Admitting: Nurse Practitioner

## 2023-08-08 DIAGNOSIS — I10 Essential (primary) hypertension: Secondary | ICD-10-CM

## 2023-08-08 DIAGNOSIS — F410 Panic disorder [episodic paroxysmal anxiety] without agoraphobia: Secondary | ICD-10-CM

## 2023-08-14 ENCOUNTER — Other Ambulatory Visit: Payer: Self-pay | Admitting: Nurse Practitioner

## 2023-08-14 DIAGNOSIS — K219 Gastro-esophageal reflux disease without esophagitis: Secondary | ICD-10-CM

## 2023-09-02 ENCOUNTER — Ambulatory Visit: Payer: MEDICAID | Admitting: Nurse Practitioner

## 2023-09-16 NOTE — Patient Instructions (Signed)

## 2023-09-28 ENCOUNTER — Other Ambulatory Visit: Payer: Self-pay | Admitting: Nurse Practitioner

## 2023-09-29 ENCOUNTER — Ambulatory Visit: Payer: MEDICAID | Admitting: Nurse Practitioner

## 2023-09-29 ENCOUNTER — Encounter: Payer: Self-pay | Admitting: Nurse Practitioner

## 2023-09-29 VITALS — BP 131/88 | HR 91 | Temp 97.4°F | Resp 20 | Ht 62.0 in | Wt 210.0 lb

## 2023-09-29 DIAGNOSIS — E785 Hyperlipidemia, unspecified: Secondary | ICD-10-CM

## 2023-09-29 DIAGNOSIS — E032 Hypothyroidism due to medicaments and other exogenous substances: Secondary | ICD-10-CM | POA: Diagnosis not present

## 2023-09-29 DIAGNOSIS — R1319 Other dysphagia: Secondary | ICD-10-CM

## 2023-09-29 DIAGNOSIS — K219 Gastro-esophageal reflux disease without esophagitis: Secondary | ICD-10-CM

## 2023-09-29 DIAGNOSIS — F3181 Bipolar II disorder: Secondary | ICD-10-CM

## 2023-09-29 DIAGNOSIS — I10 Essential (primary) hypertension: Secondary | ICD-10-CM | POA: Diagnosis not present

## 2023-09-29 DIAGNOSIS — Z23 Encounter for immunization: Secondary | ICD-10-CM | POA: Diagnosis not present

## 2023-09-29 DIAGNOSIS — F410 Panic disorder [episodic paroxysmal anxiety] without agoraphobia: Secondary | ICD-10-CM

## 2023-09-29 DIAGNOSIS — F4001 Agoraphobia with panic disorder: Secondary | ICD-10-CM

## 2023-09-29 MED ORDER — PANTOPRAZOLE SODIUM 40 MG PO TBEC: 40.0000 mg | DELAYED_RELEASE_TABLET | Freq: Every day | ORAL | 1 refills | Status: DC

## 2023-09-29 MED ORDER — LEVOTHYROXINE SODIUM 100 MCG PO TABS: 100.0000 ug | ORAL_TABLET | Freq: Every day | ORAL | 1 refills | Status: DC

## 2023-09-29 MED ORDER — HYDROCHLOROTHIAZIDE 25 MG PO TABS: 25.0000 mg | ORAL_TABLET | Freq: Every day | ORAL | 1 refills | Status: DC

## 2023-09-29 MED ORDER — PROPRANOLOL HCL 20 MG PO TABS: 20.0000 mg | ORAL_TABLET | Freq: Two times a day (BID) | ORAL | 1 refills | Status: DC

## 2023-09-29 MED ORDER — LOSARTAN POTASSIUM 100 MG PO TABS: 100.0000 mg | ORAL_TABLET | Freq: Every day | ORAL | 1 refills | Status: DC

## 2023-09-29 MED ORDER — OXYBUTYNIN CHLORIDE ER 10 MG PO TB24: 10.0000 mg | ORAL_TABLET | Freq: Every day | ORAL | 1 refills | Status: DC

## 2023-09-29 NOTE — Progress Notes (Signed)
Subjective:    Patient ID: Dawn Craig, female    DOB: [birth year], 52 y.o.   MRN: [id number]   Chief Complaint: medical management of chronic issues     HPI:  Dawn Craig is a 52 y.o. who identifies as a female who was assigned female at birth.   Social history: Lives with: by herself- her sister and brother in law check on her daily Work history: disability   Comes in today for follow up of the following chronic medical issues:  1. Essential hypertension, benign No c/o chest pain, sob or headache. Does not check blood pressure at home. BP Readings from Last 3 Encounters:  01/28/23 103/68  07/30/22 (!) 152/87  07/13/22 135/86     2. Gastroesophageal reflux disease, unspecified whether esophagitis present Is on protonicx and is doing well.  3. Esophageal dysphagia No recent swallowing issues  4. 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 We increased her dose of levothyroxine at last visit. Lab Results  Component Value Date   TSH 5.960 (H) 01/28/2023     5. Hyperlipidemia with target LDL less than 100 Does not watch diet and does no dedicated exercise. She stopped taking her statin. Lab Results  Component Value Date   CHOL 233 (H) 01/28/2023   HDL 30 (L) 01/28/2023   LDLCALC 142 (H) 01/28/2023   TRIG 334 (H) 01/28/2023   CHOLHDL 7.8 (H) 01/28/2023   The 10-year ASCVD risk score (Arnett DK, et al., 2019) is: 4.8%   6. Agoraphobia with panic attacks 7. Bipolar II disorder (HCC) 8. Panic disorder Is on zoloft. Stays anxious all the time. Does not go out of house. Her sister and brother in law do her grocery shopping. So she only goes out of house to come to the doctor.    09/29/2023   11:54 AM 01/28/2023    9:14 AM 07/30/2022    2:28 PM 07/13/2022   11:25 AM  GAD 7 : Generalized Anxiety Score  Nervous, Anxious, on Edge 1 0 1 3  Control/stop worrying 2 2 0 3  Worry too much - different things 2 2 0 3  Trouble  relaxing 2 1 1 2   Restless 1 1 1 1   Easily annoyed or irritable 2 1 2 2   Afraid - awful might happen 1 1 0 3  Total GAD 7 Score 11 8 5 17   Anxiety Difficulty Very difficult Very difficult Somewhat difficult Extremely difficult       09/29/2023   11:54 AM 01/28/2023    9:14 AM 07/30/2022    2:27 PM  Depression screen PHQ 2/9  Decreased Interest 1 1 1   Down, Depressed, Hopeless 1 1 0  PHQ - 2 Score 2 2 1   Altered sleeping 2 1 2   Tired, decreased energy 1 1 1   Change in appetite 2 1 1   Feeling bad or failure about yourself  1 1 0  Trouble concentrating 3 2 2   Moving slowly or fidgety/restless 1 2 0  Suicidal thoughts 0 1 0  PHQ-9 Score 12 11 7   Difficult doing work/chores Very difficult Very difficult Not difficult at all     9. Severe obesity (BMI >= 40) (HCC) Weight is down 8lbs Wt Readings from Last 3 Encounters:  09/29/23 210 lb (95.3 kg)  01/28/23 218 lb (98.9 kg)  07/30/22 238 lb (108 kg)   BMI Readings from Last 3 Encounters:  09/29/23 38.41 kg/m  01/28/23 39.87 kg/m  07/30/22 43.53 kg/m  New complaints: None today  Allergies  Allergen Reactions   Ace Inhibitors Cough   Famvir [Famciclovir] Hives and Swelling    Throat closes    Tetanus Toxoids     Swelling at sight   Outpatient Encounter Medications as of 09/29/2023  Medication Sig   betamethasone dipropionate (DIPROLENE) 0.05 % cream Apply topically 2 (two) times daily.   CAPLYTA 42 MG capsule SMARTSIG:1 Capsule(s) By Mouth Every Evening   cetirizine (ZYRTEC) 10 MG tablet TAKE 1 TABLET BY MOUTH EVERY DAY   Cholecalciferol (VITAMIN D) 2000 UNITS CAPS Take 2,000 Units by mouth daily.   fluticasone (FLONASE) 50 MCG/ACT nasal spray SPRAY 2 SPRAYS INTO EACH NOSTRIL EVERY DAY   hydrochlorothiazide (HYDRODIURIL) 25 MG tablet Take 1 tablet (25 mg total) by mouth daily.   hydrOXYzine (VISTARIL) 25 MG capsule Take 1 capsule (25 mg total) by mouth every 8 (eight) hours as needed. **NEEDS TO BE SEEN BEFORE  NEXT REFILL**   lamoTRIgine (LAMICTAL) 200 MG tablet Take 1 tablet (200 mg total) by mouth daily.   levothyroxine (SYNTHROID) 100 MCG tablet TAKE 1 TABLET BY MOUTH EVERY DAY   loratadine (CLARITIN) 10 MG tablet TAKE 1 TABLET BY MOUTH EVERY DAY   losartan (COZAAR) 100 MG tablet Take 1 tablet (100 mg total) by mouth daily. **NEEDS TO BE SEEN BEFORE NEXT REFILL**   nystatin cream (MYCOSTATIN) Apply 1 application topically 2 (two) times daily.   oxybutynin (DITROPAN-XL) 10 MG 24 hr tablet TAKE 1 TABLET BY MOUTH EVERYDAY AT BEDTIME   pantoprazole (PROTONIX) 40 MG tablet TAKE 1 TABLET BY MOUTH EVERY DAY   rosuvastatin (CRESTOR) 10 MG tablet TAKE 1 TABLET BY MOUTH EVERY DAY   sertraline (ZOLOFT) 100 MG tablet TAKE 2 TABLETS BY MOUTH EVERY DAY   triamcinolone cream (KENALOG) 0.1 % Apply 1 application topically 2 (two) times daily.   [DISCONTINUED] loratadine (CLARITIN) 10 MG tablet TAKE 1 TABLET BY MOUTH EVERY DAY   Facility-Administered Encounter Medications as of 09/29/2023  Medication   medroxyPROGESTERone (DEPO-PROVERA) injection 150 mg    Past Surgical History:  Procedure Laterality Date   CESAREAN SECTION      Family History  Problem Relation Age of Onset   Hypertension Mother    Cancer Mother        skin,face   Colon polyps Mother        had to have surgery. Possible in 79s, early 29s    Hypertension Father    Hyperlipidemia Father    Depression Maternal Aunt    Bipolar disorder Cousin    Depression Other    Suicidality Other    Colon cancer Neg Hx       Controlled substance contract: n/a     Review of Systems  Constitutional:  Negative for diaphoresis.  Eyes:  Negative for pain.  Respiratory:  Negative for shortness of breath.   Cardiovascular:  Negative for chest pain, palpitations and leg swelling.  Gastrointestinal:  Negative for abdominal pain.  Endocrine: Negative for polydipsia.  Skin:  Negative for rash.  Neurological:  Negative for dizziness, weakness and  headaches.  Hematological:  Does not bruise/bleed easily.  All other systems reviewed and are negative.      Objective:   Physical Exam Vitals and nursing note reviewed.  Constitutional:      General: She is not in acute distress.    Appearance: Normal appearance. She is well-developed.  HENT:     Head: Normocephalic.     Right Ear: Tympanic membrane  normal.     Left Ear: Tympanic membrane normal.     Nose: Nose normal.     Mouth/Throat:     Mouth: Mucous membranes are moist.  Eyes:     Pupils: Pupils are equal, round, and reactive to light.  Neck:     Vascular: No carotid bruit or JVD.  Cardiovascular:     Rate and Rhythm: Normal rate and regular rhythm.     Heart sounds: Normal heart sounds.  Pulmonary:     Effort: Pulmonary effort is normal. No respiratory distress.     Breath sounds: Normal breath sounds. No wheezing or rales.  Chest:     Chest wall: No tenderness.  Abdominal:     General: Bowel sounds are normal. There is no distension or abdominal bruit.     Palpations: Abdomen is soft. There is no hepatomegaly, splenomegaly, mass or pulsatile mass.     Tenderness: There is no abdominal tenderness.  Musculoskeletal:        General: Normal range of motion.     Cervical back: Normal range of motion and neck supple.  Lymphadenopathy:     Cervical: No cervical adenopathy.  Skin:    General: Skin is warm and dry.  Neurological:     Mental Status: She is alert and oriented to person, place, and time.     Deep Tendon Reflexes: Reflexes are normal and symmetric.  Psychiatric:        Behavior: Behavior normal.        Thought Content: Thought content normal.        Judgment: Judgment normal.    BP 131/88   Pulse 91   Temp (!) 97.4 F (36.3 C) (Temporal)   Resp 20   Ht 5\' 2"  (1.575 m)   Wt 210 lb (95.3 kg)   SpO2 96%   BMI 38.41 kg/m          Assessment & Plan:  Dawn Craig comes in today with chief complaint of Medical Management of Chronic  Issues   Diagnosis and orders addressed:  1. Essential hypertension, benign Low sodium diet - hydrochlorothiazide (HYDRODIURIL) 25 MG tablet; Take 1 tablet (25 mg total) by mouth daily.  Dispense: 90 tablet; Refill: 1 - losartan (COZAAR) 100 MG tablet; Take 1 tablet (100 mg total) by mouth daily. **NEEDS TO BE SEEN BEFORE NEXT REFILL**  Dispense: 90 tablet; Refill: 1 - CBC with Differential/Platelet - CMP14+EGFR  2. Gastroesophageal reflux disease, unspecified whether esophagitis present Avoid spicy foods Do not eat 2 hours prior to bedtime - pantoprazole (PROTONIX) 40 MG tablet; Take 1 tablet (40 mg total) by mouth daily.  Dispense: 90 tablet; Refill: 1  3. Esophageal dysphagia Report any swallowing issues  4. 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  Dispense: 90 tablet; Refill: 1 - Thyroid Panel With TSH  5. Hyperlipidemia with target LDL less than 100 Low fat diet - Lipid panel  6. Agoraphobia with panic attacks 7. Bipolar II disorder (HCC) 8. Panic disorder Continue to follow up with psych - propranolol (INDERAL) 20 MG tablet; Take 1 tablet (20 mg total) by mouth 2 (two) times daily.  Dispense: 180 tablet; Refill: 1  9. Severe obesity (BMI >= 40) (HCC) Discussed diet and exercise for person with BMI >25 Will recheck weight in 3-6 months    Labs pending Health Maintenance reviewed Diet and exercise encouraged  Follow up plan:  6 months   Mary-Margaret Daphine Deutscher, FNP

## 2023-09-30 LAB — CMP14+EGFR
ALT: 12 [IU]/L (ref 0–32)
AST: 18 [IU]/L (ref 0–40)
Albumin: 4.3 g/dL (ref 3.8–4.9)
Alkaline Phosphatase: 96 [IU]/L (ref 44–121)
BUN/Creatinine Ratio: 8 — ABNORMAL LOW (ref 9–23)
BUN: 7 mg/dL (ref 6–24)
Bilirubin Total: 0.5 mg/dL (ref 0.0–1.2)
CO2: 25 mmol/L (ref 20–29)
Calcium: 9.6 mg/dL (ref 8.7–10.2)
Chloride: 98 mmol/L (ref 96–106)
Creatinine, Ser: 0.9 mg/dL (ref 0.57–1.00)
Globulin, Total: 2.4 g/dL (ref 1.5–4.5)
Glucose: 143 mg/dL — ABNORMAL HIGH (ref 70–99)
Potassium: 3.6 mmol/L (ref 3.5–5.2)
Sodium: 138 mmol/L (ref 134–144)
Total Protein: 6.7 g/dL (ref 6.0–8.5)
eGFR: 77 mL/min/{1.73_m2} (ref >59)

## 2023-09-30 LAB — CBC WITH DIFFERENTIAL/PLATELET
Basophils Absolute: 0.1 10*3/uL (ref 0.0–0.2)
Basos: 1 %
EOS (ABSOLUTE): 0.3 10*3/uL (ref 0.0–0.4)
Eos: 3 %
Hematocrit: 41.5 % (ref 34.0–46.6)
Hemoglobin: 13.9 g/dL (ref 11.1–15.9)
Immature Grans (Abs): 0 10*3/uL (ref 0.0–0.1)
Immature Granulocytes: 0 %
Lymphocytes Absolute: 4 10*3/uL — ABNORMAL HIGH (ref 0.7–3.1)
Lymphs: 37 %
MCH: 32 pg (ref 26.6–33.0)
MCHC: 33.5 g/dL (ref 31.5–35.7)
MCV: 96 fL (ref 79–97)
Monocytes Absolute: 0.7 10*3/uL (ref 0.1–0.9)
Monocytes: 7 %
Neutrophils Absolute: 5.6 10*3/uL (ref 1.4–7.0)
Neutrophils: 52 %
Platelets: 312 10*3/uL (ref 150–450)
RBC: 4.34 x10E6/uL (ref 3.77–5.28)
RDW: 12.9 % (ref 11.7–15.4)
WBC: 10.8 10*3/uL (ref 3.4–10.8)

## 2023-09-30 LAB — THYROID PANEL WITH TSH
Free Thyroxine Index: 4 (ref 1.2–4.9)
T3 Uptake Ratio: 32 % (ref 24–39)
T4, Total: 12.4 ug/dL — ABNORMAL HIGH (ref 4.5–12.0)
TSH: 4.32 u[IU]/mL (ref 0.450–4.500)

## 2023-09-30 LAB — LIPID PANEL
Chol/HDL Ratio: 6.2 ratio — ABNORMAL HIGH (ref 0.0–4.4)
Cholesterol, Total: 185 mg/dL (ref 100–199)
HDL: 30 mg/dL — ABNORMAL LOW (ref >39)
LDL Chol Calc (NIH): 117 mg/dL — ABNORMAL HIGH (ref 0–99)
Triglycerides: 215 mg/dL — ABNORMAL HIGH (ref 0–149)
VLDL Cholesterol Cal: 38 mg/dL (ref 5–40)

## 2023-12-22 ENCOUNTER — Other Ambulatory Visit: Payer: Self-pay | Admitting: Nurse Practitioner

## 2023-12-22 DIAGNOSIS — E032 Hypothyroidism due to medicaments and other exogenous substances: Secondary | ICD-10-CM

## 2023-12-23 ENCOUNTER — Other Ambulatory Visit: Payer: Self-pay | Admitting: Nurse Practitioner

## 2023-12-23 DIAGNOSIS — E032 Hypothyroidism due to medicaments and other exogenous substances: Secondary | ICD-10-CM

## 2024-03-26 NOTE — Patient Instructions (Signed)
 Our records indicate that you are due for your annual mammogram/breast imaging. While there is no way to prevent breast cancer, early detection provides the best opportunity for curing it. For women over the age of 39, the American Cancer Society recommends a yearly clinical breast exam and a yearly mammogram. These practices have saved thousands of lives. We need your help to ensure that you are receiving optimal medical care. Please call the imaging location that has done you previous mammograms. Please remember to list [place] as your primary care. This helps make sure we receive a report and can update your chart.  Below is the contact information for several local breast imaging centers. You may call the location that works best for you, and they will be happy to assistance in making you an appointment. You do not need an order for a regular screening mammogram. However, if you are having any problems or concerns with you breast area, please let your primary care provider know, and appropriate orders will be placed. Please let our office know if you have any questions or concerns. Or if you need information for another imaging center not on this list or outside of the area. We are commented to working with you on your health care journey.   The mobile unit/bus ([facility]) - they come twice a month to our location.  These appointments can be made through our office or by call The [facility]  [facility]  [address] [place], [place] [address] Phone [phone]  [facility]  [address] [place], [place] [address] [phone]  [facility] ([facility] of [facility])  [phone] [address], [place] [address] [phone]  [facility]  [address] [place] [place] [address] [phone]  [facility] - [facility]  [address] [place] [address] [phone]  [facility] Mammography in [place]  [address] [place], [place] [address] [phone]  [facility] [address] [place], [place] [address] [phone]  [facility] [address] [place], [place] [address] [phone]  [facility] [place], [place] [address] [phone]

## 2024-03-27 ENCOUNTER — Telehealth: Payer: Self-pay

## 2024-03-27 ENCOUNTER — Other Ambulatory Visit (HOSPITAL_COMMUNITY): Payer: Self-pay

## 2024-03-27 ENCOUNTER — Encounter: Payer: Self-pay | Admitting: Nurse Practitioner

## 2024-03-27 ENCOUNTER — Ambulatory Visit (INDEPENDENT_AMBULATORY_CARE_PROVIDER_SITE_OTHER): Payer: MEDICAID | Admitting: Nurse Practitioner

## 2024-03-27 VITALS — BP 114/79 | HR 86 | Temp 97.7°F | Ht 62.0 in | Wt 206.0 lb

## 2024-03-27 DIAGNOSIS — K219 Gastro-esophageal reflux disease without esophagitis: Secondary | ICD-10-CM | POA: Diagnosis not present

## 2024-03-27 DIAGNOSIS — E032 Hypothyroidism due to medicaments and other exogenous substances: Secondary | ICD-10-CM | POA: Diagnosis not present

## 2024-03-27 DIAGNOSIS — J301 Allergic rhinitis due to pollen: Secondary | ICD-10-CM

## 2024-03-27 DIAGNOSIS — F4001 Agoraphobia with panic disorder: Secondary | ICD-10-CM

## 2024-03-27 DIAGNOSIS — R1319 Other dysphagia: Secondary | ICD-10-CM

## 2024-03-27 DIAGNOSIS — I10 Essential (primary) hypertension: Secondary | ICD-10-CM | POA: Diagnosis not present

## 2024-03-27 DIAGNOSIS — E785 Hyperlipidemia, unspecified: Secondary | ICD-10-CM

## 2024-03-27 DIAGNOSIS — F3181 Bipolar II disorder: Secondary | ICD-10-CM

## 2024-03-27 DIAGNOSIS — F410 Panic disorder [episodic paroxysmal anxiety] without agoraphobia: Secondary | ICD-10-CM

## 2024-03-27 MED ORDER — LOSARTAN POTASSIUM 100 MG PO TABS: 100.0000 mg | ORAL_TABLET | Freq: Every day | ORAL | 1 refills | Status: DC

## 2024-03-27 MED ORDER — PANTOPRAZOLE SODIUM 40 MG PO TBEC: 40.0000 mg | DELAYED_RELEASE_TABLET | Freq: Every day | ORAL | 1 refills | Status: DC

## 2024-03-27 MED ORDER — LEVOTHYROXINE SODIUM 100 MCG PO TABS: 100.0000 ug | ORAL_TABLET | Freq: Every day | ORAL | 1 refills | Status: DC

## 2024-03-27 MED ORDER — PROPRANOLOL HCL 20 MG PO TABS: 20.0000 mg | ORAL_TABLET | Freq: Two times a day (BID) | ORAL | 1 refills | Status: DC

## 2024-03-27 MED ORDER — VALACYCLOVIR HCL 1 G PO TABS: ORAL_TABLET | ORAL | 1 refills | Status: AC

## 2024-03-27 MED ORDER — FEXOFENADINE HCL 180 MG PO TABS: 180.0000 mg | ORAL_TABLET | Freq: Every day | ORAL | 1 refills | Status: DC

## 2024-03-27 MED ORDER — HYDROCHLOROTHIAZIDE 25 MG PO TABS: 25.0000 mg | ORAL_TABLET | Freq: Every day | ORAL | 1 refills | Status: DC

## 2024-03-27 MED ORDER — LAMOTRIGINE 200 MG PO TABS: 200.0000 mg | ORAL_TABLET | Freq: Every day | ORAL | 1 refills | Status: DC

## 2024-03-27 NOTE — Telephone Encounter (Signed)
 Pharmacy Patient Advocate Encounter   Received notification from CoverMyMeds that prior authorization for Fexofenadine HCl 180MG  tablets is required/requested.   Insurance verification completed.   The patient is insured through [employer] .   Per test claim: PA required; PA submitted to above mentioned insurance via CoverMyMeds Key/confirmation #/EOC (Key: [id number])   Status is pending

## 2024-03-27 NOTE — Addendum Note (Signed)
 Addended by: Jayce Kainz, MARY-MARGARET on: 03/27/2024 12:27 PM   Modules accepted: Orders

## 2024-03-27 NOTE — Progress Notes (Signed)
 Subjective:    Patient ID: Dawn Craig, female    DOB: 1971/10/18, 53 y.o.   MRN: [id number]   Chief Complaint: medical management of chronic issues     HPI:  Dawn Craig is a 53 y.o. who identifies as a female who was assigned female at birth.   Social history: Lives with: by herself- her sister and brother in law check on her daily Work history: disability   Comes in today for follow up of the following chronic medical issues:  1. Essential hypertension, benign No c/o chest pain, sob or headache. Does not check blood pressure at home. BP Readings from Last 3 Encounters:  09/29/23 131/88  01/28/23 103/68  07/30/22 (!) 152/87     2. Gastroesophageal reflux disease, unspecified whether esophagitis present Is on protonicx and is doing well.  3. Esophageal dysphagia No recent swallowing issues. But has dry mouth alot  4. 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 We increased her dose of levothyroxine  at last visit. Lab Results  Component Value Date   TSH 4.320 09/29/2023     5. Hyperlipidemia with target LDL less than 100 Does not watch diet and does no dedicated exercise. She stopped taking her statin. Lab Results  Component Value Date   CHOL 185 09/29/2023   HDL 30 (L) 09/29/2023   LDLCALC 117 (H) 09/29/2023   TRIG 215 (H) 09/29/2023   CHOLHDL 6.2 (H) 09/29/2023   The 10-year ASCVD risk score (Arnett DK, et al., 2019) is: 3.8%   6. Agoraphobia with panic attacks 7. Bipolar II disorder (HCC) 8. Panic disorder Is on zoloft . Stays anxious all the time. Does not go out of house. Her sister and brother in law do her grocery shopping. So she only goes out of house to come to the doctor.    09/29/2023   11:54 AM 01/28/2023    9:14 AM 07/30/2022    2:28 PM 07/13/2022   11:25 AM  GAD 7 : Generalized Anxiety Score  Nervous, Anxious, on Edge 1 0 1 3  Control/stop worrying 2 2 0 3  Worry too much - different things 2 2 0  3  Trouble relaxing 2 1 1 2   Restless 1 1 1 1   Easily annoyed or irritable 2 1 2 2   Afraid - awful might happen 1 1 0 3  Total GAD 7 Score 11 8 5 17   Anxiety Difficulty Very difficult Very difficult Somewhat difficult Extremely difficult       03/27/2024   12:12 PM 09/29/2023   11:54 AM 01/28/2023    9:14 AM  Depression screen PHQ 2/9  Decreased Interest 1 1 1   Down, Depressed, Hopeless 1 1 1   PHQ - 2 Score 2 2 2   Altered sleeping 2 2 1   Tired, decreased energy 1 1 1   Change in appetite 1 2 1   Feeling bad or failure about yourself  1 1 1   Trouble concentrating 3 3 2   Moving slowly or fidgety/restless 1 1 2   Suicidal thoughts 0 0 1  PHQ-9 Score 11 12 11   Difficult doing work/chores Somewhat difficult Very difficult Very difficult        9. Severe obesity (BMI >= 40) (HCC) Weight is down 4lbs  Wt Readings from Last 3 Encounters:  03/27/24 206 lb (93.4 kg)  09/29/23 210 lb (95.3 kg)  01/28/23 218 lb (98.9 kg)   BMI Readings from Last 3 Encounters:  03/27/24 37.68 kg/m  09/29/23 38.41 kg/m  01/28/23 39.87 kg/m       - allergic rhinitis- claritin  and zyrtec  make her eyes watery. Would like something else.   Allergies  Allergen Reactions   Ace Inhibitors Cough   Famvir [Famciclovir] Hives and Swelling    Throat closes    Tetanus Toxoids     Swelling at sight   Outpatient Encounter Medications as of 03/27/2024  Medication Sig   betamethasone  dipropionate (DIPROLENE ) 0.05 % cream Apply topically 2 (two) times daily.   CAPLYTA 42 MG capsule SMARTSIG:1 Capsule(s) By Mouth Every Evening   cetirizine  (ZYRTEC ) 10 MG tablet TAKE 1 TABLET BY MOUTH EVERY DAY   Cholecalciferol (VITAMIN D ) 2000 UNITS CAPS Take 2,000 Units by mouth daily.   fluticasone  (FLONASE ) 50 MCG/ACT nasal spray SPRAY 2 SPRAYS INTO EACH NOSTRIL EVERY DAY   hydrochlorothiazide  (HYDRODIURIL ) 25 MG tablet Take 1 tablet (25 mg total) by mouth daily.   hydrOXYzine  (VISTARIL ) 25 MG capsule Take 1  capsule (25 mg total) by mouth every 8 (eight) hours as needed. **NEEDS TO BE SEEN BEFORE NEXT REFILL**   lamoTRIgine  (LAMICTAL ) 200 MG tablet Take 1 tablet (200 mg total) by mouth daily.   levothyroxine  (SYNTHROID ) 100 MCG tablet TAKE 1 TABLET BY MOUTH EVERY DAY   loratadine  (CLARITIN ) 10 MG tablet TAKE 1 TABLET BY MOUTH EVERY DAY   losartan  (COZAAR ) 100 MG tablet Take 1 tablet (100 mg total) by mouth daily. **NEEDS TO BE SEEN BEFORE NEXT REFILL**   nystatin  cream (MYCOSTATIN ) Apply 1 application topically 2 (two) times daily.   oxybutynin  (DITROPAN -XL) 10 MG 24 hr tablet Take 1 tablet (10 mg total) by mouth at bedtime.   pantoprazole  (PROTONIX ) 40 MG tablet Take 1 tablet (40 mg total) by mouth daily.   propranolol  (INDERAL ) 20 MG tablet Take 1 tablet (20 mg total) by mouth 2 (two) times daily.   traZODone  (DESYREL ) 50 MG tablet Take 50 mg by mouth at bedtime.   triamcinolone  cream (KENALOG ) 0.1 % Apply 1 application topically 2 (two) times daily.   VYVANSE  40 MG capsule Take 40 mg by mouth daily.   Facility-Administered Encounter Medications as of 03/27/2024  Medication   medroxyPROGESTERone  (DEPO-PROVERA ) injection 150 mg    Past Surgical History:  Procedure Laterality Date   CESAREAN SECTION      Family History  Problem Relation Age of Onset   Hypertension Mother    Cancer Mother        skin,face   Colon polyps Mother        had to have surgery. Possible in 40s, early 21s    Hypertension Father    Hyperlipidemia Father    Depression Maternal Aunt    Bipolar disorder Cousin    Depression Other    Suicidality Other    Colon cancer Neg Hx       Controlled substance contract: n/a     Review of Systems  Constitutional:  Negative for diaphoresis.  Eyes:  Negative for pain.  Respiratory:  Negative for shortness of breath.   Cardiovascular:  Negative for chest pain, palpitations and leg swelling.  Gastrointestinal:  Negative for abdominal pain.  Endocrine: Negative for  polydipsia.  Skin:  Negative for rash.  Neurological:  Negative for dizziness, weakness and headaches.  Hematological:  Does not bruise/bleed easily.  All other systems reviewed and are negative.      Objective:   Physical Exam Vitals and nursing note reviewed.  Constitutional:      General: She is not in acute  distress.    Appearance: Normal appearance. She is well-developed.  HENT:     Head: Normocephalic.     Right Ear: Tympanic membrane normal.     Left Ear: Tympanic membrane normal.     Nose: Nose normal.     Mouth/Throat:     Mouth: Mucous membranes are moist.  Eyes:     Pupils: Pupils are equal, round, and reactive to light.  Neck:     Vascular: No carotid bruit or JVD.  Cardiovascular:     Rate and Rhythm: Normal rate and regular rhythm.     Heart sounds: Normal heart sounds.  Pulmonary:     Effort: Pulmonary effort is normal. No respiratory distress.     Breath sounds: Normal breath sounds. No wheezing or rales.  Chest:     Chest wall: No tenderness.  Abdominal:     General: Bowel sounds are normal. There is no distension or abdominal bruit.     Palpations: Abdomen is soft. There is no hepatomegaly, splenomegaly, mass or pulsatile mass.     Tenderness: There is no abdominal tenderness.  Musculoskeletal:        General: Normal range of motion.     Cervical back: Normal range of motion and neck supple.  Lymphadenopathy:     Cervical: No cervical adenopathy.  Skin:    General: Skin is warm and dry.  Neurological:     Mental Status: She is alert and oriented to person, place, and time.     Deep Tendon Reflexes: Reflexes are normal and symmetric.  Psychiatric:        Behavior: Behavior normal.        Thought Content: Thought content normal.        Judgment: Judgment normal.    There were no vitals taken for this visit.         Assessment & Plan:  Aideen Heidtman comes in today with chief complaint of No chief complaint on file.   Diagnosis and orders  addressed:  1. Essential hypertension, benign Low sodium diet - hydrochlorothiazide  (HYDRODIURIL ) 25 MG tablet; Take 1 tablet (25 mg total) by mouth daily.  Dispense: 90 tablet; Refill: 1 - losartan  (COZAAR ) 100 MG tablet; Take 1 tablet (100 mg total) by mouth daily. **NEEDS TO BE SEEN BEFORE NEXT REFILL**  Dispense: 90 tablet; Refill: 1 - CBC with Differential/Platelet - CMP14+EGFR  2. Gastroesophageal reflux disease, unspecified whether esophagitis present Avoid spicy foods Do not eat 2 hours prior to bedtime - pantoprazole  (PROTONIX ) 40 MG tablet; Take 1 tablet (40 mg total) by mouth daily.  Dispense: 90 tablet; Refill: 1  3. Esophageal dysphagia Report any swallowing issues  4. 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  Dispense: 90 tablet; Refill: 1 - Thyroid  Panel With TSH  5. Hyperlipidemia with target LDL less than 100 Low fat diet - Lipid panel  6. Agoraphobia with panic attacks 7. Bipolar II disorder (HCC) 8. Panic disorder Continue to follow up with psych - propranolol  (INDERAL ) 20 MG tablet; Take 1 tablet (20 mg total) by mouth 2 (two) times daily.  Dispense: 180 tablet; Refill: 1  9. Severe obesity (BMI >= 40) (HCC) Discussed diet and exercise for person with BMI >25 Will recheck weight in 3-6 months    Labs pending Health Maintenance reviewed Diet and exercise encouraged  Follow up plan: 6 months   Mary-Margaret Gaylyn Keas, FNP

## 2024-03-28 ENCOUNTER — Telehealth: Payer: Self-pay

## 2024-03-28 ENCOUNTER — Other Ambulatory Visit (HOSPITAL_COMMUNITY): Payer: Self-pay

## 2024-03-28 LAB — CBC WITH DIFFERENTIAL/PLATELET
Basophils Absolute: 0.1 10*3/uL (ref 0.0–0.2)
Basos: 1 %
EOS (ABSOLUTE): 0.4 10*3/uL (ref 0.0–0.4)
Eos: 4 %
Hematocrit: 42.9 % (ref 34.0–46.6)
Hemoglobin: 14.5 g/dL (ref 11.1–15.9)
Immature Grans (Abs): 0 10*3/uL (ref 0.0–0.1)
Immature Granulocytes: 0 %
Lymphocytes Absolute: 3.5 10*3/uL — ABNORMAL HIGH (ref 0.7–3.1)
Lymphs: 36 %
MCH: 32 pg (ref 26.6–33.0)
MCHC: 33.8 g/dL (ref 31.5–35.7)
MCV: 95 fL (ref 79–97)
Monocytes Absolute: 0.6 10*3/uL (ref 0.1–0.9)
Monocytes: 7 %
Neutrophils Absolute: 5.1 10*3/uL (ref 1.4–7.0)
Neutrophils: 52 %
Platelets: 321 10*3/uL (ref 150–450)
RBC: 4.53 x10E6/uL (ref 3.77–5.28)
RDW: 13 % (ref 11.7–15.4)
WBC: 9.6 10*3/uL (ref 3.4–10.8)

## 2024-03-28 LAB — LIPID PANEL
Chol/HDL Ratio: 5.3 ratio — ABNORMAL HIGH (ref 0.0–4.4)
Cholesterol, Total: 176 mg/dL (ref 100–199)
HDL: 33 mg/dL — ABNORMAL LOW (ref >39)
LDL Chol Calc (NIH): 98 mg/dL (ref 0–99)
Triglycerides: 267 mg/dL — ABNORMAL HIGH (ref 0–149)
VLDL Cholesterol Cal: 45 mg/dL — ABNORMAL HIGH (ref 5–40)

## 2024-03-28 LAB — CMP14+EGFR
ALT: 11 IU/L (ref 0–32)
AST: 14 IU/L (ref 0–40)
Albumin: 4.5 g/dL (ref 3.8–4.9)
Alkaline Phosphatase: 98 IU/L (ref 44–121)
BUN/Creatinine Ratio: 9 (ref 9–23)
BUN: 7 mg/dL (ref 6–24)
Bilirubin Total: 0.6 mg/dL (ref 0.0–1.2)
CO2: 29 mmol/L (ref 20–29)
Calcium: 9.7 mg/dL (ref 8.7–10.2)
Chloride: 97 mmol/L (ref 96–106)
Creatinine, Ser: 0.81 mg/dL (ref 0.57–1.00)
Globulin, Total: 2.2 g/dL (ref 1.5–4.5)
Glucose: 116 mg/dL — ABNORMAL HIGH (ref 70–99)
Potassium: 3.7 mmol/L (ref 3.5–5.2)
Sodium: 140 mmol/L (ref 134–144)
Total Protein: 6.7 g/dL (ref 6.0–8.5)
eGFR: 87 mL/min/{1.73_m2} (ref >59)

## 2024-03-28 LAB — THYROID PANEL WITH TSH
Free Thyroxine Index: 3.3 (ref 1.2–4.9)
T3 Uptake Ratio: 28 % (ref 24–39)
T4, Total: 11.9 ug/dL (ref 4.5–12.0)
TSH: 1.02 u[IU]/mL (ref 0.450–4.500)

## 2024-03-28 NOTE — Telephone Encounter (Signed)
 Copied from CRM [id number]. Topic: Clinical - Medication Question >> Mar 28, 2024 10:11 AM Brynn Caras wrote: Reason for CRM: The patient states she was seen by her PCP yesterday for a chronic follow-up, but she forgot to mention the fungus growth on her toenails located on her left foot. She would like to determine if she will need to schedule another appointment, or if she can receive an antibiotic to treat the fungus? She states she prefers to take a pill rather than applying ointment/cream to the area of her foot. She confirmed her pharmacy: [facility] - [address] [place]. Callback #:[phone]

## 2024-03-28 NOTE — Telephone Encounter (Signed)
 Pharmacy Patient Advocate Encounter  Received notification from [facility] that Prior Authorization for Fexofenadine HCl 180MG  tablets has been APPROVED from 4.30.25 to 4.30.26. Ran test claim, Copay is $4.00. This test claim was processed through [facility]- copay amounts may vary at other pharmacies due to pharmacy/plan contracts, or as the patient moves through the different stages of their insurance plan.   PA #/Case ID/Reference #: (Key: [id number])

## 2024-03-29 NOTE — Telephone Encounter (Signed)
 Antibiotics will not take care of fungus- please try lamisil OTC and see if helps

## 2024-03-29 NOTE — Telephone Encounter (Signed)
 Patient wants to know if she can have the rx lamisil

## 2024-03-30 MED ORDER — TERBINAFINE HCL 1 % EX CREA
1.0000 | TOPICAL_CREAM | Freq: Two times a day (BID) | CUTANEOUS | 0 refills | Status: AC
Start: 2024-03-30 — End: ?

## 2024-06-02 ENCOUNTER — Other Ambulatory Visit: Payer: Self-pay | Admitting: Nurse Practitioner

## 2024-08-06 ENCOUNTER — Telehealth: Payer: Self-pay | Admitting: Nurse Practitioner

## 2024-08-06 ENCOUNTER — Encounter: Payer: Self-pay | Admitting: Nurse Practitioner

## 2024-08-06 ENCOUNTER — Ambulatory Visit: Payer: MEDICAID | Admitting: Nurse Practitioner

## 2024-08-06 VITALS — BP 106/73 | HR 95 | Temp 97.4°F | Ht 62.0 in | Wt 195.0 lb

## 2024-08-06 DIAGNOSIS — F422 Mixed obsessional thoughts and acts: Secondary | ICD-10-CM

## 2024-08-06 DIAGNOSIS — F410 Panic disorder [episodic paroxysmal anxiety] without agoraphobia: Secondary | ICD-10-CM

## 2024-08-06 NOTE — Patient Instructions (Signed)

## 2024-08-06 NOTE — Progress Notes (Signed)
 Subjective:    Patient ID: Dawn Craig, female    DOB: 03/21/1971, 53 y.o.   MRN: [id number]   Chief Complaint: Needs help in the home   HPI  Patient in stating that she needs help in her home with minor cleaning and cooking. She says she just feels so weak and she is not able to do what she needs to do on her own. Says she is having trouble focusing which makes her OCD worse. Says he has to do things in 3's. Has panic attacks when she is doing any house work. Gets confused about what order to do things in then she has panic attack.    03/27/2024   12:13 PM 09/29/2023   11:54 AM 01/28/2023    9:14 AM 07/30/2022    2:28 PM  GAD 7 : Generalized Anxiety Score  Nervous, Anxious, on Edge 2 1 0 1  Control/stop worrying 2 2 2  0  Worry too much - different things 2 2 2  0  Trouble relaxing 2 2 1 1   Restless 1 1 1 1   Easily annoyed or irritable 2 2 1 2   Afraid - awful might happen 1 1 1  0  Total GAD 7 Score 12 11 8 5   Anxiety Difficulty Very difficult Very difficult Very difficult Somewhat difficult     Patient Active Problem List   Diagnosis Date Noted   Panic disorder 01/07/2020   Dysphagia 12/01/2018   Family history of colonic polyps 12/01/2018   Bipolar II disorder (HCC) 04/04/2018   Binge eating disorder 08/21/2014   Severe obesity (BMI >= 40) (HCC) 03/15/2014   Hyperlipidemia with target LDL less than 100 03/20/2013   Essential hypertension, benign 03/20/2013   Agoraphobia with panic attacks 03/20/2013   Urinary incontinence 03/20/2013   Hypothyroidism 03/20/2013   Migraines 03/20/2013   GERD (gastroesophageal reflux disease) 03/20/2013   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  Eyes:  Negative for pain.  Respiratory:  Negative for shortness of breath.   Cardiovascular:  Negative for chest pain, palpitations and leg swelling.  Gastrointestinal:  Negative for abdominal pain.  Endocrine:  Negative for polydipsia.  Skin:  Negative for rash.  Neurological:  Negative for dizziness, weakness and headaches.  Hematological:  Does not bruise/bleed easily.  All other systems reviewed and are negative.      Objective:   Physical Exam Constitutional:      Appearance: Normal appearance. She is obese.  Cardiovascular:     Rate and Rhythm: Normal rate and regular rhythm.     Heart sounds: Normal heart sounds.  Pulmonary:     Breath sounds: Normal breath sounds.  Skin:    General: Skin is warm.  Neurological:     General: No focal deficit present.     Mental Status: She is alert and oriented to person, place, and time.  Psychiatric:        Mood and Affect: Mood normal.        Behavior: Behavior normal.     BP 106/73   Pulse 95   Temp (!) 97.4 F (36.3 C) (Temporal)   Ht 5' 2 (1.575 m)   Wt 195 lb (88.5 kg)   SpO2 95%   BMI 35.67 kg/m        Assessment & Plan:  Ahleah Simko in today with chief complaint of Needs help in the home   1. Panic attacks (  Primary) Stress management Filled out papers for in home help  2. Mixed obsessional thoughts and acts Keep follow up with psych    The above assessment and management plan was discussed with the patient. The patient verbalized understanding of and has agreed to the management plan. Patient is aware to call the clinic if symptoms persist or worsen. Patient is aware when to return to the clinic for a follow-up visit. Patient educated on when it is appropriate to go to the emergency department.   Mary-Margaret Gladis, FNP

## 2024-08-06 NOTE — Telephone Encounter (Signed)
 Explained Personal Care Service [facility]) process to pt, Provider fills out form w/ pt's Dx's that impact her ADL's, then we fax forms to the [facility] assessment team, they will contact her to set up a time to come do an assessment w/ her and they will determine if she needs help and how much they will provide. Form faxed to [facility] team at [phone]

## 2024-08-06 NOTE — Telephone Encounter (Signed)
 R/c to pt about ppw questions. She asked to speak with [facility]. Please call back  Copied from CRM #[id number]. Topic: General - Call Back - No Documentation >> Aug 06, 2024  2:44 PM Dawn Craig wrote: Reason for CRM: patient is calling because she has a few questions about her paperwork she dropped off. She would like to speak with her provider/nurse. Please advise [phone]

## 2024-09-25 ENCOUNTER — Ambulatory Visit: Payer: MEDICAID | Admitting: Nurse Practitioner

## 2024-09-25 ENCOUNTER — Encounter: Payer: Self-pay | Admitting: Nurse Practitioner

## 2024-09-25 VITALS — BP 116/76 | HR 84 | Temp 96.7°F | Ht 62.0 in | Wt 194.0 lb

## 2024-09-25 DIAGNOSIS — I10 Essential (primary) hypertension: Secondary | ICD-10-CM | POA: Diagnosis not present

## 2024-09-25 DIAGNOSIS — F4001 Agoraphobia with panic disorder: Secondary | ICD-10-CM

## 2024-09-25 DIAGNOSIS — K219 Gastro-esophageal reflux disease without esophagitis: Secondary | ICD-10-CM | POA: Diagnosis not present

## 2024-09-25 DIAGNOSIS — F3181 Bipolar II disorder: Secondary | ICD-10-CM

## 2024-09-25 DIAGNOSIS — R1319 Other dysphagia: Secondary | ICD-10-CM

## 2024-09-25 DIAGNOSIS — E032 Hypothyroidism due to medicaments and other exogenous substances: Secondary | ICD-10-CM

## 2024-09-25 DIAGNOSIS — Z23 Encounter for immunization: Secondary | ICD-10-CM | POA: Diagnosis not present

## 2024-09-25 DIAGNOSIS — F410 Panic disorder [episodic paroxysmal anxiety] without agoraphobia: Secondary | ICD-10-CM

## 2024-09-25 LAB — LIPID PANEL

## 2024-09-25 MED ORDER — PANTOPRAZOLE SODIUM 40 MG PO TBEC: 40.0000 mg | DELAYED_RELEASE_TABLET | Freq: Every day | ORAL | 1 refills | Status: AC

## 2024-09-25 MED ORDER — PROPRANOLOL HCL 20 MG PO TABS: 20.0000 mg | ORAL_TABLET | Freq: Two times a day (BID) | ORAL | 1 refills | Status: AC

## 2024-09-25 MED ORDER — LAMOTRIGINE 200 MG PO TABS: 200.0000 mg | ORAL_TABLET | Freq: Every day | ORAL | 1 refills | Status: AC

## 2024-09-25 MED ORDER — LEVOTHYROXINE SODIUM 100 MCG PO TABS: 100.0000 ug | ORAL_TABLET | Freq: Every day | ORAL | 1 refills | Status: AC

## 2024-09-25 MED ORDER — LOSARTAN POTASSIUM 100 MG PO TABS: 100.0000 mg | ORAL_TABLET | Freq: Every day | ORAL | 1 refills | Status: AC

## 2024-09-25 MED ORDER — FLUCONAZOLE 150 MG PO TABS: ORAL_TABLET | ORAL | 0 refills | Status: AC

## 2024-09-25 MED ORDER — OXYBUTYNIN CHLORIDE ER 10 MG PO TB24: 10.0000 mg | ORAL_TABLET | Freq: Every day | ORAL | 1 refills | Status: AC

## 2024-09-25 MED ORDER — HYDROCHLOROTHIAZIDE 25 MG PO TABS: 25.0000 mg | ORAL_TABLET | Freq: Every day | ORAL | 1 refills | Status: AC

## 2024-09-25 NOTE — Patient Instructions (Signed)

## 2024-09-25 NOTE — Progress Notes (Signed)
 Subjective:    Patient ID: Dawn Craig, female    DOB: March 20, 1971, 53 y.o.   MRN: [id number]   Chief Complaint: medical management of chronic issues     HPI:  Dawn Craig is a 53 y.o. who identifies as a female who was assigned female at birth.   Social history: Lives with: by herself- her sister and brother in law check on her daily Work history: disability   Comes in today for follow up of the following chronic medical issues:  1. Essential hypertension, benign No c/o chest pain, sob or headache. Does not check blood pressure at home. BP Readings from Last 3 Encounters:  09/25/24 116/76  08/06/24 106/73  03/27/24 114/79     2. Gastroesophageal reflux disease, unspecified whether esophagitis present Is on protonicx and is doing well.  3. Esophageal dysphagia No recent swallowing issues. But has dry mouth alot  4. 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 We increased her dose of levothyroxine  at last visit. Lab Results  Component Value Date   TSH 1.020 03/27/2024     5. Hyperlipidemia with target LDL less than 100 Does not watch diet and does no dedicated exercise. She stopped taking her statin. Lab Results  Component Value Date   CHOL 176 03/27/2024   HDL 33 (L) 03/27/2024   LDLCALC 98 03/27/2024   TRIG 267 (H) 03/27/2024   CHOLHDL 5.3 (H) 03/27/2024   The 10-year ASCVD risk score (Arnett DK, et al., 2019) is: 2.5%   6. Agoraphobia with panic attacks 7. Bipolar II disorder (HCC) 8. Panic disorder Is on zoloft . Stays anxious all the time. Does not go out of house. Her sister and brother in law do her grocery shopping. So she only goes out of house to come to the doctor. She has been getting out and going to church on Sunday.    09/25/2024   12:00 PM 08/06/2024   12:10 PM 03/27/2024   12:13 PM 09/29/2023   11:54 AM  GAD 7 : Generalized Anxiety Score  Nervous, Anxious, on Edge 2 2 2 1  Control/stop worrying 2  2 2 2  Worry too much - different things 2 2 2 2  Trouble relaxing 1 1 2 2  Restless 1 1 1 1  Easily annoyed or irritable 1 1 2 2  Afraid - awful might happen 1 1 1 1  Total GAD 7 Score 10 10 12 11  Anxiety Difficulty Very difficult Very difficult Very difficult Very difficult       10 /28/2025   11:59 AM 08/06/2024   12:10 PM 03/27/2024   12:12 PM  Depression screen PHQ 2/9  Decreased Interest 1 1 1   Down, Depressed, Hopeless 0 0 1  PHQ - 2 Score 1 1 2   Altered sleeping   2  Tired, decreased energy   1  Change in appetite   1  Feeling bad or failure about yourself    1  Trouble concentrating   3  Moving slowly or fidgety/restless   1  Suicidal thoughts   0  PHQ-9 Score   11  Difficult doing work/chores   Somewhat difficult        9. Severe obesity (BMI >= 40) (HCC) Weight is down 4lbs  Wt Readings from Last 3 Encounters:  09/25/24 194 lb (88 kg)  08/06/24 195 lb (88.5 kg)  03/27/24 206 lb (93.4 kg)   BMI Readings from Last 3 Encounters:  09/25/24 35.48 kg/m  08/06/24 35.67 kg/m  03/27/24 37.68 kg/m      Allergies  Allergen Reactions   Ace Inhibitors Cough   Famvir [Famciclovir] Hives and Swelling    Throat closes    Tetanus Toxoid-Containing Vaccines     Swelling at sight   Outpatient Encounter Medications as of 09/25/2024  Medication Sig   betamethasone  dipropionate (DIPROLENE ) 0.05 % cream Apply topically 2 (two) times daily.   CAPLYTA 42 MG capsule SMARTSIG:1 Capsule(s) By Mouth Every Evening   Cholecalciferol (VITAMIN D ) 2000 UNITS CAPS Take 2,000 Units by mouth daily.   fexofenadine  (ALLEGRA ) 180 MG tablet Take 1 tablet (180 mg total) by mouth daily.   fluticasone  (FLONASE ) 50 MCG/ACT nasal spray SPRAY 2 SPRAYS INTO EACH NOSTRIL EVERY DAY   hydrochlorothiazide  (HYDRODIURIL ) 25 MG tablet Take 1 tablet (25 mg total) by mouth daily.   lamoTRIgine  (LAMICTAL ) 200 MG tablet Take 1 tablet (200 mg total) by mouth daily.   levothyroxine  (SYNTHROID ) 100  MCG tablet Take 1 tablet (100 mcg total) by mouth daily.   losartan  (COZAAR ) 100 MG tablet Take 1 tablet (100 mg total) by mouth daily. **NEEDS TO BE SEEN BEFORE NEXT REFILL**   nystatin  cream (MYCOSTATIN ) Apply 1 application topically 2 (two) times daily.   oxybutynin  (DITROPAN -XL) 10 MG 24 hr tablet TAKE 1 TABLET BY MOUTH EVERYDAY AT BEDTIME   pantoprazole  (PROTONIX ) 40 MG tablet Take 1 tablet (40 mg total) by mouth daily.   propranolol  (INDERAL ) 20 MG tablet Take 1 tablet (20 mg total) by mouth 2 (two) times daily.   terbinafine  (LAMISIL  AT) 1 % cream Apply 1 Application topically 2 (two) times daily.   valACYclovir  (VALTREX ) 1000 MG tablet 1 tablets PO at onset of fever blister and repeat in12 hours. No more then 4 tablets in 24 hours   VYVANSE  40 MG capsule Take 40 mg by mouth daily.   Facility-Administered Encounter Medications as of 09/25/2024  Medication   medroxyPROGESTERone  (DEPO-PROVERA ) injection 150 mg    Past Surgical History:  Procedure Laterality Date   CESAREAN SECTION      Family History  Problem Relation Age of Onset   Hypertension Mother    Cancer Mother        skin,face   Colon polyps Mother        had to have surgery. Possible in 51s, early 16s    Hypertension Father    Hyperlipidemia Father    Depression Maternal Aunt    Bipolar disorder Cousin    Depression Other    Suicidality Other    Colon cancer Neg Hx       Controlled substance contract: n/a     Review of Systems  Constitutional:  Negative for diaphoresis.  Eyes:  Negative for pain.  Respiratory:  Negative for shortness of breath.   Cardiovascular:  Negative for chest pain, palpitations and leg swelling.  Gastrointestinal:  Negative for abdominal pain.  Endocrine: Negative for polydipsia.  Skin:  Negative for rash.  Neurological:  Negative for dizziness, weakness and headaches.  Hematological:  Does not bruise/bleed easily.  All other systems reviewed and are negative.       Objective:   Physical Exam Vitals and nursing note reviewed.  Constitutional:      General: She is not in acute distress.    Appearance: Normal appearance. She is well-developed.  HENT:     Head: Normocephalic.     Right Ear: Tympanic membrane normal.     Left Ear: Tympanic membrane normal.  Nose: Nose normal.     Mouth/Throat:     Mouth: Mucous membranes are moist.  Eyes:     Pupils: Pupils are equal, round, and reactive to light.  Neck:     Vascular: No carotid bruit or JVD.  Cardiovascular:     Rate and Rhythm: Normal rate and regular rhythm.     Heart sounds: Normal heart sounds.  Pulmonary:     Effort: Pulmonary effort is normal. No respiratory distress.     Breath sounds: Normal breath sounds. No wheezing or rales.  Chest:     Chest wall: No tenderness.  Abdominal:     General: Bowel sounds are normal. There is no distension or abdominal bruit.     Palpations: Abdomen is soft. There is no hepatomegaly, splenomegaly, mass or pulsatile mass.     Tenderness: There is no abdominal tenderness.  Musculoskeletal:        General: Normal range of motion.     Cervical back: Normal range of motion and neck supple.  Lymphadenopathy:     Cervical: No cervical adenopathy.  Skin:    General: Skin is warm and dry.  Neurological:     Mental Status: She is alert and oriented to person, place, and time.     Deep Tendon Reflexes: Reflexes are normal and symmetric.  Psychiatric:        Behavior: Behavior normal.        Thought Content: Thought content normal.        Judgment: Judgment normal.    BP 116/76   Pulse 84   Temp (!) 96.7 F (35.9 C) (Temporal)   Ht 5' 2 (1.575 m)   Wt 194 lb (88 kg)   SpO2 94%   BMI 35.48 kg/m          Assessment & Plan:  Luretha Eberly comes in today with chief complaint of Medical Management of Chronic Issues   Diagnosis and orders addressed:  1. Essential hypertension, benign Low sodium diet - hydrochlorothiazide  (HYDRODIURIL ) 25  MG tablet; Take 1 tablet (25 mg total) by mouth daily.  Dispense: 90 tablet; Refill: 1 - losartan  (COZAAR ) 100 MG tablet; Take 1 tablet (100 mg total) by mouth daily. **NEEDS TO BE SEEN BEFORE NEXT REFILL**  Dispense: 90 tablet; Refill: 1 - CBC with Differential/Platelet - CMP14+EGFR  2. Gastroesophageal reflux disease, unspecified whether esophagitis present Avoid spicy foods Do not eat 2 hours prior to bedtime - pantoprazole  (PROTONIX ) 40 MG tablet; Take 1 tablet (40 mg total) by mouth daily.  Dispense: 90 tablet; Refill: 1  3. Esophageal dysphagia Report any swallowing issues  4. 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  Dispense: 90 tablet; Refill: 1 - Thyroid  Panel With TSH  5. Hyperlipidemia with target LDL less than 100 Low fat diet - Lipid panel  6. Agoraphobia with panic attacks 7. Bipolar II disorder (HCC) 8. Panic disorder Continue to follow up with psych - propranolol  (INDERAL ) 20 MG tablet; Take 1 tablet (20 mg total) by mouth 2 (two) times daily.  Dispense: 180 tablet; Refill: 1  9. Severe obesity (BMI >= 40) (HCC) Discussed diet and exercise for person with BMI >25 Will recheck weight in 3-6 months   Labs pending Health Maintenance reviewed Diet and exercise encouraged  Follow up plan: 6 months   Mary-Margaret Gladis, FNP

## 2024-09-25 NOTE — Addendum Note (Signed)
 Addended by: Mailen Newborn, MARY-MARGARET on: 09/25/2024 12:38 PM   Modules accepted: Orders

## 2024-09-26 LAB — CMP14+EGFR
ALT: 14 IU/L (ref 0–32)
AST: 17 IU/L (ref 0–40)
Albumin: 4.6 g/dL (ref 3.8–4.9)
Alkaline Phosphatase: 84 IU/L (ref 49–135)
BUN/Creatinine Ratio: 10 (ref 9–23)
BUN: 8 mg/dL (ref 6–24)
Bilirubin Total: 0.6 mg/dL (ref 0.0–1.2)
CO2: 25 mmol/L (ref 20–29)
Calcium: 9.9 mg/dL (ref 8.7–10.2)
Chloride: 96 mmol/L (ref 96–106)
Creatinine, Ser: 0.8 mg/dL (ref 0.57–1.00)
Globulin, Total: 2.1 g/dL (ref 1.5–4.5)
Glucose: 127 mg/dL — AB (ref 70–99)
Potassium: 3.7 mmol/L (ref 3.5–5.2)
Sodium: 137 mmol/L (ref 134–144)
Total Protein: 6.7 g/dL (ref 6.0–8.5)
eGFR: 89 mL/min/1.73 (ref >59)

## 2024-09-26 LAB — LIPID PANEL
Cholesterol, Total: 188 mg/dL (ref 100–199)
HDL: 35 mg/dL — AB (ref >39)
LDL CALC COMMENT:: 5.4 ratio — AB (ref 0.0–4.4)
LDL Chol Calc (NIH): 121 mg/dL — AB (ref 0–99)
Triglycerides: 178 mg/dL — AB (ref 0–149)
VLDL Cholesterol Cal: 32 mg/dL (ref 5–40)

## 2024-09-26 LAB — THYROID PANEL WITH TSH
Free Thyroxine Index: 2.5 (ref 1.2–4.9)
T3 Uptake Ratio: 26 % (ref 24–39)
T4, Total: 9.6 ug/dL (ref 4.5–12.0)
TSH: 2.55 u[IU]/mL (ref 0.450–4.500)

## 2024-09-26 LAB — CBC WITH DIFFERENTIAL/PLATELET
Basophils Absolute: 0.1 x10E3/uL (ref 0.0–0.2)
Basos: 1 %
EOS (ABSOLUTE): 0.3 x10E3/uL (ref 0.0–0.4)
Eos: 3 %
Hematocrit: 43.9 % (ref 34.0–46.6)
Hemoglobin: 14.4 g/dL (ref 11.1–15.9)
Immature Grans (Abs): 0 x10E3/uL (ref 0.0–0.1)
Immature Granulocytes: 0 %
Lymphocytes Absolute: 3.2 x10E3/uL — ABNORMAL HIGH (ref 0.7–3.1)
Lymphs: 36 %
MCH: 31.8 pg (ref 26.6–33.0)
MCHC: 32.8 g/dL (ref 31.5–35.7)
MCV: 97 fL (ref 79–97)
Monocytes Absolute: 0.6 x10E3/uL (ref 0.1–0.9)
Monocytes: 6 %
Neutrophils Absolute: 4.7 x10E3/uL (ref 1.4–7.0)
Neutrophils: 54 %
Platelets: 307 x10E3/uL (ref 150–450)
RBC: 4.53 x10E6/uL (ref 3.77–5.28)
RDW: 12.3 % (ref 11.7–15.4)
WBC: 8.8 x10E3/uL (ref 3.4–10.8)

## 2024-09-27 ENCOUNTER — Ambulatory Visit: Payer: Self-pay | Admitting: Nurse Practitioner

## 2024-10-30 ENCOUNTER — Telehealth: Payer: Self-pay | Admitting: Family Medicine

## 2024-10-30 ENCOUNTER — Other Ambulatory Visit: Payer: Self-pay | Admitting: Nurse Practitioner

## 2024-10-30 DIAGNOSIS — J301 Allergic rhinitis due to pollen: Secondary | ICD-10-CM

## 2024-10-30 NOTE — Telephone Encounter (Signed)
   Patient requesting Jacques Naegeli be continuing prescriber for her Propanolol Rx and not PCP.

## 2024-10-30 NOTE — Telephone Encounter (Signed)
 Copied from CRM [id number]. Topic: Clinical - Prescription Issue >> Oct 30, 2024  3:30 PM Dawn Craig wrote: Reason for CRM: Pt called reporting that she has another provider that fills her propanolol. She says she has asked the nurse plenty of times to fix this. Her doctor that prescribes this has also faxed in a request to takeover this medication. She is asking for this to be removed from the medication list that [facility] prescribes, please advise.   She needs it to remain on her list, just not what [facility] prescribes.

## 2024-11-01 NOTE — Telephone Encounter (Signed)
 Please tell patint that we have to leave on her list, but remind when she has follow up that I do not need to refill that.

## 2024-11-01 NOTE — Telephone Encounter (Signed)
 Called and spoke with patient and made her aware of PCP advise. She voiced understanding.

## 2025-03-25 ENCOUNTER — Ambulatory Visit: Payer: Self-pay | Admitting: Nurse Practitioner
